# Patient Record
Sex: Female | Born: 1940 | Race: White | Hispanic: No | Marital: Married | Smoking: Never smoker
Health system: Southern US, Community
[De-identification: ages and names within clinical notes are randomized; demographics above are authoritative.]

## PROBLEM LIST (undated history)

## (undated) DIAGNOSIS — G43909 Migraine, unspecified, not intractable, without status migrainosus: Secondary | ICD-10-CM

## (undated) DIAGNOSIS — K219 Gastro-esophageal reflux disease without esophagitis: Secondary | ICD-10-CM

## (undated) DIAGNOSIS — I10 Essential (primary) hypertension: Secondary | ICD-10-CM

## (undated) DIAGNOSIS — M722 Plantar fascial fibromatosis: Secondary | ICD-10-CM

## (undated) DIAGNOSIS — K5730 Diverticulosis of large intestine without perforation or abscess without bleeding: Secondary | ICD-10-CM

## (undated) DIAGNOSIS — F329 Major depressive disorder, single episode, unspecified: Secondary | ICD-10-CM

## (undated) DIAGNOSIS — E785 Hyperlipidemia, unspecified: Secondary | ICD-10-CM

## (undated) DIAGNOSIS — F3289 Other specified depressive episodes: Secondary | ICD-10-CM

## (undated) DIAGNOSIS — M4800 Spinal stenosis, site unspecified: Secondary | ICD-10-CM

## (undated) DIAGNOSIS — E042 Nontoxic multinodular goiter: Secondary | ICD-10-CM

## (undated) DIAGNOSIS — R9431 Abnormal electrocardiogram [ECG] [EKG]: Secondary | ICD-10-CM

## (undated) HISTORY — DX: Hyperlipidemia, unspecified: E78.5

## (undated) HISTORY — DX: Plantar fascial fibromatosis: M72.2

## (undated) HISTORY — DX: Migraine, unspecified, not intractable, without status migrainosus: G43.909

## (undated) HISTORY — DX: Diverticulosis of large intestine without perforation or abscess without bleeding: K57.30

## (undated) HISTORY — DX: Gastro-esophageal reflux disease without esophagitis: K21.9

## (undated) HISTORY — DX: Major depressive disorder, single episode, unspecified: F32.9

## (undated) HISTORY — DX: Abnormal electrocardiogram (ECG) (EKG): R94.31

## (undated) HISTORY — PX: CATARACT EXTRACTION: SUR2

## (undated) HISTORY — DX: Nontoxic multinodular goiter: E04.2

## (undated) HISTORY — DX: Other specified depressive episodes: F32.89

## (undated) HISTORY — PX: OTHER SURGICAL HISTORY: SHX169

## (undated) HISTORY — DX: Essential (primary) hypertension: I10

## (undated) HISTORY — PX: TONSILLECTOMY: SHX5217

## (undated) HISTORY — DX: Spinal stenosis, site unspecified: M48.00

---

## 1999-10-29 ENCOUNTER — Encounter: Admission: RE | Admit: 1999-10-29 | Discharge: 1999-10-29 | Payer: Self-pay | Admitting: Internal Medicine

## 1999-10-29 ENCOUNTER — Encounter: Payer: Self-pay | Admitting: Internal Medicine

## 2000-09-22 ENCOUNTER — Other Ambulatory Visit: Admission: RE | Admit: 2000-09-22 | Discharge: 2000-09-22 | Payer: Self-pay | Admitting: Internal Medicine

## 2000-10-31 ENCOUNTER — Encounter: Admission: RE | Admit: 2000-10-31 | Discharge: 2000-10-31 | Payer: Self-pay | Admitting: Internal Medicine

## 2000-10-31 ENCOUNTER — Encounter: Payer: Self-pay | Admitting: Internal Medicine

## 2000-11-07 ENCOUNTER — Other Ambulatory Visit: Admission: RE | Admit: 2000-11-07 | Discharge: 2000-11-07 | Payer: Self-pay | Admitting: Obstetrics and Gynecology

## 2000-11-07 ENCOUNTER — Encounter (INDEPENDENT_AMBULATORY_CARE_PROVIDER_SITE_OTHER): Payer: Self-pay | Admitting: *Deleted

## 2001-08-25 ENCOUNTER — Encounter: Admission: RE | Admit: 2001-08-25 | Discharge: 2001-10-23 | Payer: Self-pay | Admitting: Internal Medicine

## 2001-11-01 ENCOUNTER — Encounter: Admission: RE | Admit: 2001-11-01 | Discharge: 2001-11-01 | Payer: Self-pay | Admitting: Internal Medicine

## 2001-11-01 ENCOUNTER — Encounter: Payer: Self-pay | Admitting: Internal Medicine

## 2002-11-06 ENCOUNTER — Encounter: Payer: Self-pay | Admitting: Internal Medicine

## 2002-11-06 ENCOUNTER — Encounter: Admission: RE | Admit: 2002-11-06 | Discharge: 2002-11-06 | Payer: Self-pay | Admitting: Internal Medicine

## 2002-11-08 ENCOUNTER — Encounter: Payer: Self-pay | Admitting: Internal Medicine

## 2002-11-08 ENCOUNTER — Encounter: Admission: RE | Admit: 2002-11-08 | Discharge: 2002-11-08 | Payer: Self-pay | Admitting: Internal Medicine

## 2003-11-11 ENCOUNTER — Encounter: Admission: RE | Admit: 2003-11-11 | Discharge: 2003-11-11 | Payer: Self-pay | Admitting: Internal Medicine

## 2004-01-15 ENCOUNTER — Other Ambulatory Visit: Admission: RE | Admit: 2004-01-15 | Discharge: 2004-01-15 | Payer: Self-pay | Admitting: Internal Medicine

## 2004-11-12 ENCOUNTER — Encounter: Admission: RE | Admit: 2004-11-12 | Discharge: 2004-11-12 | Payer: Self-pay | Admitting: Internal Medicine

## 2005-01-14 ENCOUNTER — Other Ambulatory Visit: Admission: RE | Admit: 2005-01-14 | Discharge: 2005-01-14 | Payer: Self-pay | Admitting: Internal Medicine

## 2005-11-15 ENCOUNTER — Encounter: Admission: RE | Admit: 2005-11-15 | Discharge: 2005-11-15 | Payer: Self-pay | Admitting: Internal Medicine

## 2006-01-17 ENCOUNTER — Other Ambulatory Visit: Admission: RE | Admit: 2006-01-17 | Discharge: 2006-01-17 | Payer: Self-pay | Admitting: Internal Medicine

## 2006-02-03 ENCOUNTER — Ambulatory Visit: Payer: Self-pay | Admitting: Gastroenterology

## 2006-03-15 ENCOUNTER — Ambulatory Visit: Payer: Self-pay | Admitting: Gastroenterology

## 2006-03-15 ENCOUNTER — Encounter (INDEPENDENT_AMBULATORY_CARE_PROVIDER_SITE_OTHER): Payer: Self-pay | Admitting: Specialist

## 2006-11-18 ENCOUNTER — Encounter: Admission: RE | Admit: 2006-11-18 | Discharge: 2006-11-18 | Payer: Self-pay | Admitting: *Deleted

## 2006-12-30 ENCOUNTER — Encounter: Admission: RE | Admit: 2006-12-30 | Discharge: 2006-12-30 | Payer: Self-pay | Admitting: *Deleted

## 2007-10-12 ENCOUNTER — Encounter: Payer: Self-pay | Admitting: Internal Medicine

## 2007-10-12 LAB — CONVERTED CEMR LAB
AST: 24 units/L
BUN: 15 mg/dL
Calcium: 9.7 mg/dL
Chloride: 101 meq/L
Cholesterol: 165 mg/dL
Glucose, Bld: 94 mg/dL
HDL: 58 mg/dL
LDL Cholesterol: 89 mg/dL
Potassium: 3.7 meq/L
Sodium: 140 meq/L
Total Protein: 6.7 g/dL

## 2007-11-24 ENCOUNTER — Encounter: Admission: RE | Admit: 2007-11-24 | Discharge: 2007-11-24 | Payer: Self-pay | Admitting: Internal Medicine

## 2008-04-29 ENCOUNTER — Encounter: Payer: Self-pay | Admitting: Internal Medicine

## 2008-04-29 LAB — CONVERTED CEMR LAB
AST: 26 units/L
Alkaline Phosphatase: 96 units/L
BUN: 18 mg/dL
Calcium: 10.4 mg/dL
Cholesterol: 192 mg/dL
Glucose, Bld: 93 mg/dL
LDL Cholesterol: 115 mg/dL
Potassium: 4.7 meq/L
Sodium: 143 meq/L
Total Protein: 7.4 g/dL

## 2008-05-08 ENCOUNTER — Encounter: Admission: RE | Admit: 2008-05-08 | Discharge: 2008-05-08 | Payer: Self-pay | Admitting: Family Medicine

## 2008-11-22 ENCOUNTER — Encounter: Payer: Self-pay | Admitting: Internal Medicine

## 2008-11-22 LAB — CONVERTED CEMR LAB
CO2: 30 meq/L
Chloride: 105 meq/L
Creatinine, Ser: 0.8 mg/dL
Potassium: 4.8 meq/L
Sodium: 142 meq/L
Triglyceride fasting, serum: 138 mg/dL

## 2008-11-25 ENCOUNTER — Encounter: Admission: RE | Admit: 2008-11-25 | Discharge: 2008-11-25 | Payer: Self-pay | Admitting: Family Medicine

## 2009-06-23 ENCOUNTER — Encounter: Payer: Self-pay | Admitting: Internal Medicine

## 2009-06-23 ENCOUNTER — Encounter: Admission: RE | Admit: 2009-06-23 | Discharge: 2009-06-23 | Payer: Self-pay | Admitting: Internal Medicine

## 2009-08-25 ENCOUNTER — Encounter: Payer: Self-pay | Admitting: Internal Medicine

## 2009-08-27 ENCOUNTER — Encounter: Payer: Self-pay | Admitting: Internal Medicine

## 2009-08-27 ENCOUNTER — Encounter: Admission: RE | Admit: 2009-08-27 | Discharge: 2009-08-27 | Payer: Self-pay | Admitting: *Deleted

## 2009-09-22 ENCOUNTER — Encounter: Payer: Self-pay | Admitting: Internal Medicine

## 2009-09-22 ENCOUNTER — Other Ambulatory Visit: Admission: RE | Admit: 2009-09-22 | Discharge: 2009-09-22 | Payer: Self-pay | Admitting: Obstetrics and Gynecology

## 2009-09-25 ENCOUNTER — Encounter: Payer: Self-pay | Admitting: Internal Medicine

## 2009-11-06 ENCOUNTER — Encounter: Payer: Self-pay | Admitting: Internal Medicine

## 2009-12-29 ENCOUNTER — Ambulatory Visit: Payer: Self-pay | Admitting: Internal Medicine

## 2009-12-29 DIAGNOSIS — F329 Major depressive disorder, single episode, unspecified: Secondary | ICD-10-CM

## 2009-12-29 DIAGNOSIS — F419 Anxiety disorder, unspecified: Secondary | ICD-10-CM

## 2009-12-29 DIAGNOSIS — I10 Essential (primary) hypertension: Secondary | ICD-10-CM | POA: Insufficient documentation

## 2009-12-29 DIAGNOSIS — E785 Hyperlipidemia, unspecified: Secondary | ICD-10-CM | POA: Insufficient documentation

## 2009-12-29 DIAGNOSIS — G43909 Migraine, unspecified, not intractable, without status migrainosus: Secondary | ICD-10-CM | POA: Insufficient documentation

## 2009-12-31 ENCOUNTER — Telehealth: Payer: Self-pay | Admitting: Internal Medicine

## 2010-01-09 ENCOUNTER — Encounter: Payer: Self-pay | Admitting: Internal Medicine

## 2010-01-09 DIAGNOSIS — M722 Plantar fascial fibromatosis: Secondary | ICD-10-CM

## 2010-01-09 DIAGNOSIS — M48061 Spinal stenosis, lumbar region without neurogenic claudication: Secondary | ICD-10-CM

## 2010-01-09 DIAGNOSIS — K219 Gastro-esophageal reflux disease without esophagitis: Secondary | ICD-10-CM

## 2010-01-09 DIAGNOSIS — K5730 Diverticulosis of large intestine without perforation or abscess without bleeding: Secondary | ICD-10-CM | POA: Insufficient documentation

## 2010-01-27 ENCOUNTER — Encounter: Payer: Self-pay | Admitting: Internal Medicine

## 2010-03-12 ENCOUNTER — Encounter: Admission: RE | Admit: 2010-03-12 | Discharge: 2010-03-12 | Payer: Self-pay | Admitting: Internal Medicine

## 2010-03-13 ENCOUNTER — Ambulatory Visit: Payer: Self-pay | Admitting: Internal Medicine

## 2010-03-15 DIAGNOSIS — R3989 Other symptoms and signs involving the genitourinary system: Secondary | ICD-10-CM

## 2010-03-16 LAB — CONVERTED CEMR LAB
HDL: 51 mg/dL (ref >39.00)
Total CHOL/HDL Ratio: 4
VLDL: 47.6 mg/dL — ABNORMAL HIGH (ref 0.0–40.0)

## 2010-04-06 ENCOUNTER — Encounter: Payer: Self-pay | Admitting: Internal Medicine

## 2010-04-15 ENCOUNTER — Telehealth: Payer: Self-pay | Admitting: Internal Medicine

## 2010-07-07 ENCOUNTER — Telehealth: Payer: Self-pay | Admitting: Internal Medicine

## 2010-07-07 DIAGNOSIS — N926 Irregular menstruation, unspecified: Secondary | ICD-10-CM

## 2010-07-07 DIAGNOSIS — N939 Abnormal uterine and vaginal bleeding, unspecified: Secondary | ICD-10-CM

## 2010-07-15 ENCOUNTER — Telehealth (INDEPENDENT_AMBULATORY_CARE_PROVIDER_SITE_OTHER): Payer: Self-pay | Admitting: *Deleted

## 2010-08-31 ENCOUNTER — Encounter
Admission: RE | Admit: 2010-08-31 | Discharge: 2010-08-31 | Payer: Self-pay | Source: Home / Self Care | Attending: Internal Medicine | Admitting: Internal Medicine

## 2010-09-01 ENCOUNTER — Encounter: Payer: Self-pay | Admitting: Internal Medicine

## 2010-09-13 LAB — CONVERTED CEMR LAB
ALT: 29 units/L
AST: 25 units/L
CO2: 24 meq/L
Chloride: 109 meq/L
Cholesterol: 229 mg/dL
HDL: 58 mg/dL
LDL Cholesterol: 131 mg/dL
Platelets: 297 10*3/uL
Potassium: 3.9 meq/L
Sodium: 143 meq/L
Triglyceride fasting, serum: 201 mg/dL
WBC: 7.4 10*3/uL

## 2010-09-15 ENCOUNTER — Ambulatory Visit
Admission: RE | Admit: 2010-09-15 | Discharge: 2010-09-15 | Payer: Self-pay | Source: Home / Self Care | Attending: Internal Medicine | Admitting: Internal Medicine

## 2010-09-15 ENCOUNTER — Encounter: Payer: Self-pay | Admitting: Internal Medicine

## 2010-09-15 DIAGNOSIS — R9431 Abnormal electrocardiogram [ECG] [EKG]: Secondary | ICD-10-CM | POA: Insufficient documentation

## 2010-09-15 HISTORY — DX: Abnormal electrocardiogram (ECG) (EKG): R94.31

## 2010-09-15 NOTE — Progress Notes (Signed)
Summary: referral/VAL pt  Phone Note Call from Patient Call back at Home Phone [phone]   Caller: Patient Summary of Call: Pt called requesting referral to female GYN. Pt states she cannot wait for VAL return because she is experiencing mild to moderate spotting Initial call taken by: Margaret Pyle, CMA,  July 07, 2010 11:20 AM  Follow-up for Phone Call        done Follow-up by: Minus Breeding MD,  July 07, 2010 12:47 PM  Additional Follow-up for Phone Call Additional follow up Details #1::        Pt informed and will expect call from [facility] with appt info Additional Follow-up by: Margaret Pyle, CMA,  July 07, 2010 2:54 PM  New Problems: ABNORMAL VAGINAL BLEEDING (ICD-626.9)   New Problems: ABNORMAL VAGINAL BLEEDING (ICD-626.9)

## 2010-09-15 NOTE — Progress Notes (Signed)
Summary: shingle injection  Phone Note Outgoing Call   Call placed by: Orlan Leavens RMA,  April 15, 2010 2:37 PM Call placed to: Patient Summary of Call: Tried to call pt with info concerning shingle injection. Was'nt at home. Left msg on vm for her to return my call. Since insurance Medicare does'nt have PART D it does not cover the shingle injection. she would have to pay up front. with her secondary [employer] she could give claim to them and they should be able to reimburse her back. Initial call taken by: Orlan Leavens RMA,  April 15, 2010 2:40 PM  Follow-up for Phone Call        Notified pt with info concerning injection. Pt states will think about getting at next office vist Follow-up by: Orlan Leavens RMA,  April 21, 2010 1:21 PM

## 2010-09-15 NOTE — Letter (Signed)
Summary: [facility]   Imported By: Sherian Rein 02/11/2010 09:29:15  _____________________________________________________________________  External Attachment:    Type:   Image     Comment:   External Document

## 2010-09-15 NOTE — Letter (Signed)
Summary: [facility]   Imported By: Sherian Rein 02/11/2010 09:26:02  _____________________________________________________________________  External Attachment:    Type:   Image     Comment:   External Document

## 2010-09-15 NOTE — Progress Notes (Signed)
Summary: Rx refill req  Phone Note Call from Patient Call back at Home Phone [phone]   Caller: Patient Summary of Call: pt called requesting [facility]ill of [facility] Initial call taken by: Margaret Pyle, CMA,  Dec 31, 2009 4:29 PM  Follow-up for Phone Call        ok to call in as listed - disp #30, 3 refills - thanks Follow-up by: Newt Lukes MD,  Dec 31, 2009 4:53 PM    Prescriptions: ZOLPIDEM TARTRATE 5 MG TABS (ZOLPIDEM TARTRATE) take 1-2 by mouth at bedtime prn  #30 x 3   Entered and Authorized by:   Margaret Pyle, CMA   Signed by:   Margaret Pyle, CMA on 12/31/2009   Method used:   [facility]phoned to ...       [facility]  [address] [id number]       [address]       [place], [place]  [address]       Ph: [phone] or [phone]       Fax: [phone]   RxID:   [id number]

## 2010-09-15 NOTE — Consult Note (Signed)
Summary: [facility]   Imported By: Lester Chesapeake 04/09/2010 10:38:58  _____________________________________________________________________  External Attachment:    Type:   Image     Comment:   External Document

## 2010-09-15 NOTE — Letter (Signed)
Summary: [facility]   Imported By: Sherian Rein 02/11/2010 09:28:11  _____________________________________________________________________  External Attachment:    Type:   Image     Comment:   External Document

## 2010-09-15 NOTE — Assessment & Plan Note (Signed)
Summary: 6-12 WK YEARLY---STC   Vital Signs:  Patient profile:   70 year old female Height:      64 inches (162.56 cm) Weight:      149.8 pounds (68.09 kg) O2 Sat:      96 % on Room air Temp:     97.9 degrees F (36.61 degrees C) oral Pulse rate:   83 / minute BP sitting:   132 / 72  (left arm) Cuff size:   regular  Vitals Entered By: Orlan Leavens RMA (March 13, 2010 9:33 AM)  O2 Flow:  Room air CC: CPX Is Patient Diabetic? No Pain Assessment Patient in pain? no      Comments Pt states she had EKG done @ [facility] results was normal  Vision Screening:Left eye with correction: 20 / 30 Right eye with correction: 20 / 25 Both eyes with correction: 20 / 25  Color vision testing: normal      Vision Entered By: Newt Lukes MD (March 13, 2010 10:22 AM)   Primary Care Provider:  Newt Lukes MD  CC:  CPX.  History of Present Illness: Here for wellness Diet: Heart Healthy or DM if diabetic Physical Activities: Sedentary Depression/mood screen: controlled symptoms - on med tx for same, see below Hearing: denies problem, no formal screening ever done Visual Acuity: Grossly normal, wears reading glasses, sees optho yearly  ADL's: Capable - timed get-up and go approp Fall Risk: None Home Safety: Good End-of-Life Planning: Advance directive - Full code/I agree   alos review other med issues: 1) chronic low back pain - sees ortho for pain mgmt of same - occ tramadol use if severe pain - no trauma or change in nature of pain, no radiation  2) dyslipidemia - reports compliance with ongoing medical treatment and no changes in medication dose or frequency. denies adverse side effects related to current therapy. no GI or new MSkel c/o  3) migraines - reports compliance with ongoing medical treatment and no changes in medication dose or frequency. denies adverse side effects related to current therapy but would like to wean off topamax if possible -has  restablished with neuro for review of symptoms and trigger point shots [facility]  4) depression - begun on lexapro 09/2009 for same - initially much improved on tx - less anxious, less sad, less tearful - changed to generic citalopram 11/2009 due to high cost of lexapro - - not yet ready to stop taking med and ?if increase dose ok to improve "nervousness" spells  Preventive Screening-Counseling & Management  Alcohol-Tobacco     Alcohol drinks/day: 0     Alcohol Counseling: not indicated; patient does not drink     Smoking Status: never     Tobacco Counseling: not indicated; no tobacco use  Caffeine-Diet-Exercise     Caffeine Counseling: not indicated; caffeine use is not excessive or problematic     Nutrition Referrals: no     Does Patient Exercise: no     Exercise Counseling: to improve exercise regimen     Depression Counseling: not indicated; screening negative for depression  Safety-Violence-Falls     Seat Belt Use: yes     Seat Belt Counseling: not applicable     Helmet Use: yes     Helmet Counseling: not applicable     Firearms in the Home: firearms in the home     Firearm Counseling: not indicated; uses recommended firearm safety measures     Smoke Detectors:  yes     Smoke Detector Counseling: n/a     Violence in the Home: no risk noted     Fall Risk Counseling: not indicated; no significant falls noted  Clinical Review Panels:  Prevention   Last Mammogram:  No specific mammographic evidence of malignancy.   (08/27/2009)   Last Colonoscopy:  Location:  [facility].  Results: Normal. (01/14/2006)  Immunizations   Last Tetanus Booster:  Td (03/13/2010)   Last Pneumovax:  Pneumovax (Medicare) (03/13/2010)  Lipid Management   Cholesterol:  229 (06/23/2009)   LDL (bad choesterol):  131 (06/23/2009)   HDL (good cholesterol):  58 (06/23/2009)   Triglycerides:  201 (06/23/2009)  CBC   WBC:  7.4 (06/23/2009)   RBC:  4.61 (06/23/2009)   Hgb:  15.0  (06/23/2009)   Hct:  42.7 (06/23/2009)   Platelets:  297 (06/23/2009)  Complete Metabolic Panel   Glucose:  103 (06/23/2009)   Sodium:  143 (06/23/2009)   Potassium:  3.9 (06/23/2009)   Chloride:  109 (06/23/2009)   CO2:  24 (06/23/2009)   BUN:  22 (06/23/2009)   Creatinine:  0.80 (06/23/2009)   Albumin:  4.4 (06/23/2009)   Total Protein:  7.1 (06/23/2009)   Calcium:  10.0 (06/23/2009)   Total Bili:  0.6 (06/23/2009)   Alk Phos:  87 (06/23/2009)   SGPT (ALT):  29 (06/23/2009)   SGOT (AST):  25 (06/23/2009)   Current Medications (verified): 1)  Citracal Petites/vitamin D 200-250 Mg-Unit Tabs (Calcium Citrate-Vitamin D) .... Take 2 Two Times A Day 2)  Zolpidem Tartrate 5 Mg Tabs (Zolpidem Tartrate) .... Take 1-2 By Mouth At Bedtime Prn 3)  Multivitamins  Tabs (Multiple Vitamin) .... Take 1 By Mouth Once Daily 4)  Topamax 50 Mg Tabs (Topiramate) .Marland Kitchen.. 1 By Mouth Once Daily 5)  Imitrex 100 Mg Tabs (Sumatriptan Succinate) .... Take 1 By Mouth Once Daily As Needed 6)  Advicor 1000-20 Mg Xr24h-Tab (Niacin-Lovastatin) .... Take 2 At Bedtime 7)  Omeprazole 20 Mg Cpdr (Omeprazole) .... Take 1 By Mouth Once Daily 8)  Tramadol Hcl 50 Mg Tabs (Tramadol Hcl) .... Take 1 Q 4-6 Hours As Needed 9)  Citalopram Hydrobromide 20 Mg Tabs (Citalopram Hydrobromide) .Marland Kitchen.. 1 By Mouth Once Daily  Allergies (verified): No Known Drug Allergies  Past History:  Past medical, surgical, family and social histories (including risk factors) reviewed, and no changes noted (except as noted below).  Past Medical History: Depression Hypertension dyslipidemia multinodular goiter GERD  MD roster: pain - wang ([facility]) endo - [facility] - [facility] - [facility]  Past Surgical History: Reviewed history from 12/29/2009 and no changes required. Tonsillectomy (1950) Child birth x's 2 (75 & 25)  Family History: Reviewed history from 12/29/2009 and no changes required. Family History of Arthritis  (parent) Family History High cholesterol (parent) Heart disease (parent, grandparent)  Social History: Reviewed history from 12/29/2009 and no changes required. Never Smoked, no alcohol married, lives with spouse and 2 sons - invloved with social activites with spouse at senior center and church  Review of Systems       occ bladder pressure, no dysuria - no prolapse - otherwise, see HPI above. I have reviewed all other systems and they were negative.   Physical Exam  General:  alert, well-developed, well-nourished, and cooperative to examination.    Eyes:  vision grossly intact; pupils equal, round and reactive to light.  conjunctiva and lids normal.    Ears:  normal pinnae bilaterally, without erythema, swelling,  or tenderness to palpation. TMs clear, without effusion, or cerumen impaction. Hearing grossly normal bilaterally  Mouth:  teeth and gums in good repair; mucous membranes moist, without lesions or ulcers. oropharynx clear without exudate, no erythema.  Neck:  supple, full ROM, no masses, no thyromegaly; no thyroid nodules or tenderness. no JVD or carotid bruits.   Lungs:  normal respiratory effort, no intercostal retractions or use of accessory muscles; normal breath sounds bilaterally - no crackles and no wheezes.    Heart:  normal rate, regular rhythm, no murmur, and no rub. BLE without edema. normal DP pulses and normal cap refill in all 4 extremities    Abdomen:  soft, non-tender, normal bowel sounds, no distention; no masses and no appreciable hepatomegaly or splenomegaly.   Genitalia:  defer Msk:  No deformity or scoliosis noted of thoracic or lumbar spine.   Neurologic:  alert & oriented X3 and cranial nerves II-XII symetrically intact.  strength normal in all extremities, sensation intact to light touch, and gait normal. speech fluent without dysarthria or aphasia; follows commands with good comprehension.  Skin:  no rashes, vesicles, ulcers, or erythema. No nodules or  irregularity to palpation.  Psych:  Oriented X3, memory intact for recent and remote, normally interactive, good eye contact, not anxious appearing, not depressed appearing, and not agitated.      Impression & Recommendations:  Problem # 1:  PREVENTIVE HEALTH CARE (ICD-V70.0) Patient has been counseled on age-appropriate routine health concerns for screening and prevention. These are reviewed and up-to-date. Immunizations are up-to-date or declined.  Risk factors for depression per USPSTF are reviewed and negative;Patient hearing function and visual acuity is screened today; ADLs are reviewed and addressed as needed; functional ability and level of safety have been reviewed and are appropriate. Education, counseling, and referrals are performed based on  urologic compliants - see below Orders: First annual wellness visit with prevention plan  (Z6109)  Problem # 2:  DYSLIPIDEMIA (ICD-272.4)  Her updated medication list for this problem includes:    Advicor 1000-20 Mg Xr24h-tab (Niacin-lovastatin) .Marland Kitchen... Take 2 at bedtime  Orders: TLB-Lipid Panel (80061-LIPID)  Labs Reviewed: SGOT: 25 (06/23/2009)   SGPT: 29 (06/23/2009)   HDL:58 (06/23/2009), 64 (11/22/2008)  LDL:131 (06/23/2009), 134 (11/22/2008)  Chol:229 (06/23/2009), 226 (11/22/2008)  Trig:201 (06/23/2009), 138 (11/22/2008)  Problem # 3:  DEPRESSION (ICD-311)  Her updated medication list for this problem includes:    Citalopram Hydrobromide 40 Mg Tabs (Citalopram hydrobromide) .Marland Kitchen... 1 by mouth once daily    Trazodone Hcl 50 Mg Tabs (Trazodone hcl) .Marland Kitchen... 1 by mouth at bedtime  Orders: Prescription Created Electronically [phone]) continue generic celexa, but inc dose -new erx done cont tx as symptoms well controlled but pt to contact us if change related to med change - willuse trazodone in place of BZ for sleep aide - pt agrees - new erx done  Problem # 4:  HYPERTENSION (ICD-401.9)  The following medications were removed from  the medication list:    Hydrochlorothiazide 25 Mg Tabs (Hydrochlorothiazide) .Marland Kitchen... Take 1 by mouth once daily as needed  BP today: 132/72 Prior BP: 130/72 (12/29/2009)  Labs Reviewed: K+: 3.9 (06/23/2009) Creat: : 0.80 (06/23/2009)   Chol: 229 (06/23/2009)   HDL: 58 (06/23/2009)   LDL: 131 (06/23/2009)   TG: 201 (06/23/2009)  Problem # 5:  OTHER SYMPTOMS INVOLVING URINARY SYSTEM (ICD-788.99)  Orders: Urology Referral (Urology)  Complete Medication List: 1)  Citracal Petites/vitamin D 200-250 Mg-unit Tabs (Calcium citrate-vitamin d) .... Take 2 two  times a day 2)  Zolpidem Tartrate 5 Mg Tabs (Zolpidem tartrate) .... Take 1-2 by mouth at bedtime prn 3)  Multivitamins Tabs (Multiple vitamin) .... Take 1 by mouth once daily 4)  Topamax 50 Mg Tabs (Topiramate) .Marland Kitchen.. 1 by mouth once daily 5)  Imitrex 100 Mg Tabs (Sumatriptan succinate) .... Take 1 by mouth once daily as needed 6)  Advicor 1000-20 Mg Xr24h-tab (Niacin-lovastatin) .... Take 2 at bedtime 7)  Omeprazole 20 Mg Cpdr (Omeprazole) .... Take 1 by mouth once daily 8)  Tramadol Hcl 50 Mg Tabs (Tramadol hcl) .... Take 1 q 4-6 hours as needed 9)  Citalopram Hydrobromide 40 Mg Tabs (Citalopram hydrobromide) .Marland Kitchen.. 1 by mouth once daily 10)  Trazodone Hcl 50 Mg Tabs (Trazodone hcl) .Marland Kitchen.. 1 by mouth at bedtime 11)  Zovirax 5 % Oint (Acyclovir) .... Use as needed  Other Orders: TD Toxoids IM 7 YR + (81191) Admin 1st Vaccine (47829) Pneumococcal Vaccine (56213) Admin of Any Addtl Vaccine (08657)  Patient Instructions: 1)  it was good to see you today. 2)  test(s) ordered today - your results will be posted on the phone tree for review in 48-72 hours from the time of test completion; call [phone] and enter your 9 digit MRN (listed above on this page, just below your name); if any changes need to be made or there are abnormal results, you will be contacted directly. 3)  we'll make referral to urology. Our office will contact you  regarding this appointment once made.  4)  increase citalopram and start trazodone for sleep and nervousness - 5)  these new medications and refills have been electronically submitted to your [facility] pharmacy. Please take as directed. Contact our office if you believe you're having problems with the medication(s).  6)  Tdap and pneumoavax shots today 7)  Please schedule a follow-up appointment in 6 months to review cholesterol and medications, call sooner if problems.  Prescriptions: ZOVIRAX 5 % OINT (ACYCLOVIR) use as needed  #3g x 1   Entered and Authorized by:   Newt Lukes MD   Signed by:   Newt Lukes MD on 03/13/2010   Method used:   Print then Give to Patient   RxID:   [id number] TRAZODONE HCL 50 MG TABS (TRAZODONE HCL) 1 by mouth at bedtime  #30 x 6   Entered and Authorized by:   Newt Lukes MD   Signed by:   Newt Lukes MD on 03/13/2010   Method used:   Electronically to        [facility] (retail)       [address]       [place], [place]  [address]       Ph: [phone]       Fax: [phone]   RxID:   [id number] TRAMADOL HCL 50 MG TABS (TRAMADOL HCL) take 1 q 4-6 hours as needed  #180 x 6   Entered and Authorized by:   Newt Lukes MD   Signed by:   Newt Lukes MD on 03/13/2010   Method used:   Electronically to        [facility] (retail)       [address]       [place], [place]  [address]       Ph: [phone]  Fax: [phone]   RxID:   [id number] OMEPRAZOLE 20 MG CPDR (OMEPRAZOLE) take 1 by mouth once daily  #30 x 6   Entered and Authorized by:   Newt Lukes MD   Signed by:   Newt Lukes MD on 03/13/2010   Method used:   Electronically to        [facility] (retail)       [address]       [place], [place]  [address]       Ph: [phone]       Fax: [phone]   RxID:    [id number] CITALOPRAM HYDROBROMIDE 40 MG TABS (CITALOPRAM HYDROBROMIDE) 1 by mouth once daily  #30 x 6   Entered and Authorized by:   Newt Lukes MD   Signed by:   Newt Lukes MD on 03/13/2010   Method used:   Electronically to        [facility] (retail)       [address]       [place], [place]  [address]       Ph: [phone]       Fax: [phone]   RxID:   [id number]    Immunizations Administered:  Tetanus Vaccine:    Vaccine Type: Td    Site: right deltoid    Mfr: Sanofi Pasteur    Dose: 0.5 ml    Route: IM    Given by: Orlan Leavens RMA    Exp. Date: 09/17/2011    Lot #: [id number]    VIS given: 03/13/10  Pneumonia Vaccine:    Vaccine Type: Pneumovax (Medicare)    Site: left deltoid    Mfr: Merck    Dose: 0.5 ml    Route: IM    Given by: Orlan Leavens RMA    Exp. Date: 08/15/2011    Lot #: [id number]    VIS given: 03/13/10

## 2010-09-15 NOTE — Letter (Signed)
Summary: [facility]   Imported By: Sherian Rein 02/11/2010 09:27:07  _____________________________________________________________________  External Attachment:    Type:   Image     Comment:   External Document

## 2010-09-15 NOTE — Assessment & Plan Note (Signed)
Summary: NEW / MEDICARE / #/ CD   Vital Signs:  Patient profile:   70 year old female Height:      64 inches (162.56 cm) Weight:      149.0 pounds (67.73 kg) BMI:     25.67 O2 Sat:      98 % on Room air Temp:     97.5 degrees F (36.39 degrees C) oral Pulse rate:   72 / minute BP sitting:   130 / 72  (left arm) Cuff size:   regular  Vitals Entered By: Orlan Leavens (Dec 29, 2009 2:25 PM)  O2 Flow:  Room air CC: New patient Is Patient Diabetic? No Pain Assessment Patient in pain? no        Primary Care Provider:  Newt Lukes MD  CC:  New patient.  History of Present Illness: new pt to me and our practice, here to est care prev followed at [facility]  1) chronic low back pain - sees ortho for pain mgmt of same - occ tramadol use if severe pain - no trauma or change in nature of pain, no radiation  2) dyslipidemia - reports compliance with ongoing medical treatment and no changes in medication dose or frequency. denies adverse side effects related to current therapy. no GI or new MSkel c/o  3) migraines - reports compliance with ongoing medical treatment and no changes in medication dose or frequency. denies adverse side effects related to current therapy but would like to wean off topamaz if possible - previously saw neuro (adelman) for HA and would like to restablish with neuro for review of symptoms   4) depression - begun on lexapro 09/2009 for same - feels much improved on tx - less anxious, less sad, less tearful - only concern is re: high cost of lexapro - ?generic available - not yet ready to stop taking med  Preventive Screening-Counseling & Management  Alcohol-Tobacco     Alcohol drinks/day: 0     Alcohol Counseling: not indicated; patient does not drink     Smoking Status: never     Tobacco Counseling: not indicated; no tobacco use  Caffeine-Diet-Exercise     Caffeine Counseling: not indicated; caffeine use is not excessive or problematic  Nutrition Referrals: no     Does Patient Exercise: no     Exercise Counseling: to improve exercise regimen     Depression Counseling: not indicated; screening negative for depression  Safety-Violence-Falls     Seat Belt Use: yes     Seat Belt Counseling: not applicable     Helmet Use: yes     Helmet Counseling: not applicable     Firearms in the Home: firearms in the home     Firearm Counseling: not indicated; uses recommended firearm safety measures     Smoke Detectors: yes     Smoke Detector Counseling: n/a     Violence in the Home: no risk noted     Fall Risk Counseling: not indicated; no significant falls noted  Clinical Review Panels:  Prevention   Last Mammogram:  [facility].   No specific mammographic evidence of malignancy.  Assessment: BIRADS 1. (08/27/2009)   Last Colonoscopy:  Location:  [facility].  Results: Normal. (01/14/2006)   Current Medications (verified): 1)  Citracal Petites/vitamin D 200-250 Mg-Unit Tabs (Calcium Citrate-Vitamin D) .... Take 2 Two Times A Day 2)  Zolpidem Tartrate 5 Mg Tabs (Zolpidem Tartrate) .... Take 1-2 By Mouth At Bedtime  Prn 3)  Multivitamins  Tabs (Multiple Vitamin) .... Take 1 By Mouth Once Daily 4)  Topamax 100 Mg Tabs (Topiramate) .... Take 1 By Mouth Once Daily 5)  Imitrex 100 Mg Tabs (Sumatriptan Succinate) .... Take 1 By Mouth Once Daily As Needed 6)  Hydrochlorothiazide 25 Mg Tabs (Hydrochlorothiazide) .... Take 1 By Mouth Once Daily 7)  Advicor 1000-20 Mg Xr24h-Tab (Niacin-Lovastatin) .... Take 2 At Bedtime 8)  Omeprazole 20 Mg Cpdr (Omeprazole) .... Take 1 By Mouth Once Daily 9)  Tramadol Hcl 50 Mg Tabs (Tramadol Hcl) .... Take 1 Q 4-6 Hours As Needed 10)  Lexapro 20 Mg Tabs (Escitalopram Oxalate) .... Take 1 By Mouth Qd  Allergies (verified): No Known Drug Allergies  Past History:  Past Medical History: Depression Hypertension dyslipidemia multinodular goiter  MD  rooster: pain - wang ([facility]) endo - [facility] - [facility] - [facility]  Past Surgical History: Tonsillectomy (1951) Child birth x's 2 (53 & 18)  Family History: Family History of Arthritis (parent) Family History High cholesterol (parent) Heart disease (parent, grandparent)  Social History: Never Smoked, no alcohol married, lives with spouse and 2 sons - invloved with social activites with spouse at senior center and church Smoking Status:  never Does Patient Exercise:  no Risk analyst Use:  yes  Review of Systems       see HPI above. I have reviewed all other systems and they were negative.   Physical Exam  General:  alert, well-developed, well-nourished, and cooperative to examination.    Head:  Normocephalic and atraumatic without obvious abnormalities. No apparent alopecia or balding. Eyes:  vision grossly intact; pupils equal, round and reactive to light.  conjunctiva and lids normal.    Ears:  normal pinnae bilaterally, without erythema, swelling, or tenderness to palpation. TMs clear, without effusion, or cerumen impaction. Hearing grossly normal bilaterally  Mouth:  teeth and gums in good repair; mucous membranes moist, without lesions or ulcers. oropharynx clear without exudate, no erythema.  Neck:  supple, full ROM, no masses, no thyromegaly; no thyroid nodules or tenderness. no JVD or carotid bruits.   Lungs:  normal respiratory effort, no intercostal retractions or use of accessory muscles; normal breath sounds bilaterally - no crackles and no wheezes.    Heart:  normal rate, regular rhythm, no murmur, and no rub. BLE without edema. normal DP pulses and normal cap refill in all 4 extremities    Abdomen:  soft, non-tender, normal bowel sounds, no distention; no masses and no appreciable hepatomegaly or splenomegaly.   Genitalia:  defer to gyn Msk:  No deformity or scoliosis noted of thoracic or lumbar spine.   Neurologic:  alert & oriented X3 and cranial  nerves II-XII symetrically intact.  strength normal in all extremities, sensation intact to light touch, and gait normal. speech fluent without dysarthria or aphasia; follows commands with good comprehension.  Skin:  no rashes, vesicles, ulcers, or erythema. No nodules or irregularity to palpation.  Psych:  Oriented X3, memory intact for recent and remote, normally interactive, good eye contact, not anxious appearing, not depressed appearing, and not agitated.      Impression & Recommendations:  Problem # 1:  MIGRAINE HEADACHE (ICD-346.90) start wean of topamax as per pt desire to be off this med tx - she will contact us if change precipitates exceleration in symptoms - also refer to neuro for review of migraine med mgmt, esp with med changes ongoing -  ?acupunture or massage interests per pt- Her  updated medication list for this problem includes:    Imitrex 100 Mg Tabs (Sumatriptan succinate) .Marland Kitchen... Take 1 by mouth once daily as needed    Tramadol Hcl 50 Mg Tabs (Tramadol hcl) .Marland Kitchen... Take 1 q 4-6 hours as needed  Orders: Headache Clinic Referral (Headache) Prescription Created Electronically [id number])  Problem # 2:  DEPRESSION (ICD-311)  change lexapro to generic celexa -new erx done cont tx as symptoms well controlled but pt to contact us if change related to med change Her updated medication list for this problem includes:    Citalopram Hydrobromide 20 Mg Tabs (Citalopram hydrobromide) .Marland Kitchen... 1 by mouth once daily  Orders: Prescription Created Electronically [id number])  Problem # 3:  HYPERTENSION (ICD-401.9)  Her updated medication list for this problem includes:    Hydrochlorothiazide 25 Mg Tabs (Hydrochlorothiazide) .Marland Kitchen... Take 1 by mouth once daily as needed  BP today: 130/72  Problem # 4:  DYSLIPIDEMIA (ICD-272.4) send for records -  cont same until next lab check Her updated medication list for this problem includes:    Advicor 1000-20 Mg Xr24h-tab (Niacin-lovastatin) .Marland Kitchen... Take  2 at bedtime  Complete Medication List: 1)  Citracal Petites/vitamin D 200-250 Mg-unit Tabs (Calcium citrate-vitamin d) .... Take 2 two times a day 2)  Zolpidem Tartrate 5 Mg Tabs (Zolpidem tartrate) .... Take 1-2 by mouth at bedtime prn 3)  Multivitamins Tabs (Multiple vitamin) .... Take 1 by mouth once daily 4)  Topamax 50 Mg Tabs (Topiramate) .Marland Kitchen.. 1 by mouth once daily 5)  Imitrex 100 Mg Tabs (Sumatriptan succinate) .... Take 1 by mouth once daily as needed 6)  Hydrochlorothiazide 25 Mg Tabs (Hydrochlorothiazide) .... Take 1 by mouth once daily as needed 7)  Advicor 1000-20 Mg Xr24h-tab (Niacin-lovastatin) .... Take 2 at bedtime 8)  Omeprazole 20 Mg Cpdr (Omeprazole) .... Take 1 by mouth once daily 9)  Tramadol Hcl 50 Mg Tabs (Tramadol hcl) .... Take 1 q 4-6 hours as needed 10)  Citalopram Hydrobromide 20 Mg Tabs (Citalopram hydrobromide) .Marland Kitchen.. 1 by mouth once daily  Patient Instructions: 1)  it was good to meet you today.  2)  change Lexapro to generic citalopram  -  3)  will reduce dose of topamax to start weaning off this medications - 4)  your prescriptions have been electronically submitted to your pharmacy. Please take as directed. Contact our office if you believe you're having problems with the medication(s).  5)  we'll make referral to [facility] for your migraines. Our office will contact you regarding this appointment once made.  6)  will send for records from [place] at [facility] 7)  Please schedule a follow-up appointment in 6-12 weeks for physical, call sooner if problems. come in fasting for labwork that day. Prescriptions: TRAMADOL HCL 50 MG TABS (TRAMADOL HCL) take 1 q 4-6 hours as needed  #40 x 5   Entered and Authorized by:   Newt Lukes MD   Signed by:   Newt Lukes MD on 12/29/2009   Method used:   Electronically to        [facility]  [address] [id number] (retail)       [address]       [place], [place]   [address]       Ph: [phone] or [phone]       Fax: [phone]   RxID:   [id number] OMEPRAZOLE 20 MG CPDR (OMEPRAZOLE) take 1 by mouth once daily  #  30 x 5   Entered and Authorized by:   Newt Lukes MD   Signed by:   Newt Lukes MD on 12/29/2009   Method used:   Electronically to        [facility]  [address] [id number] (retail)       [address]       [place], [place]  [address]       Ph: [phone] or [phone]       Fax: [phone]   RxID:   [id number] ADVICOR 1000-20 MG XR24H-TAB (NIACIN-LOVASTATIN) take 2 at bedtime  #60 x 5   Entered and Authorized by:   Newt Lukes MD   Signed by:   Newt Lukes MD on 12/29/2009   Method used:   [facility] to        [facility]  [address] [id number]       [address]       [place], [place]  [address]       Ph: [phone] or [phone]       Fax: [phone]   RxID:   [id number] IMITREX 100 MG TABS (SUMATRIPTAN SUCCINATE) take 1 by mouth once daily as needed  #27 x 3   Entered and Authorized by:   Newt Lukes MD   Signed by:   Newt Lukes MD on 12/29/2009   Method used:   [facility] to        [facility]  [address] [id number]       [address]       [place], [place]  [address]       Ph: [phone] or [phone]       Fax: [phone]   RxID:   [id number] TOPAMAX 50 MG TABS (TOPIRAMATE) 1 by mouth once daily  #30 x 5   Entered and Authorized by:   Newt Lukes MD   Signed by:   Newt Lukes MD on 12/29/2009   Method used:   [facility] to        [facility]  [address] [id number]       [address]       [place], [place]  [address]       Ph: [phone] or [phone]       Fax: [phone]   RxID:   [id number] CITALOPRAM HYDROBROMIDE 20 MG TABS (CITALOPRAM HYDROBROMIDE) 1 by mouth once daily  #30 x 5   Entered and Authorized by:   Newt Lukes MD    Signed by:   Newt Lukes MD on 12/29/2009   Method used:   [facility] to        [facility]  [address] [id number]       [address]       [place], [place]  [address]       Ph: [phone] or [phone]       Fax: [phone]   RxID:   [id number]    Colonoscopy  Procedure date:  01/14/2006  Findings:      Location:  [facility].  Results: Normal.

## 2010-09-15 NOTE — Consult Note (Signed)
[facility]   Imported By: Sherian Rein 02/02/2010 09:08:01  _____________________________________________________________________  External Attachment:    Type:   Image     Comment:   External Document

## 2010-09-17 NOTE — Progress Notes (Signed)
Summary: Referral  Phone Note Call from Patient Call back at Home Phone [phone]   Caller: Patient Summary of Call: Pt called to check status of referral to GYN, please call pt and advise. Initial call taken by: Margaret Pyle, CMA,  July 15, 2010 11:10 AM  Follow-up for Phone Call        pt appt was  scheduled Tue jan 3,2011@10 :15 Dr Arlyce Dice pt states she already made her own appt  Follow-up by: Shelbie Proctor,  July 31, 2010 9:53 AM

## 2010-09-17 NOTE — Letter (Signed)
Summary: [facility]  [facility]   Imported By: Sherian Rein 09/08/2010 12:00:32  _____________________________________________________________________  External Attachment:    Type:   Image     Comment:   External Document

## 2010-09-21 ENCOUNTER — Ambulatory Visit: Payer: Self-pay | Admitting: Internal Medicine

## 2010-09-23 ENCOUNTER — Telehealth (INDEPENDENT_AMBULATORY_CARE_PROVIDER_SITE_OTHER): Payer: Self-pay | Admitting: *Deleted

## 2010-09-23 NOTE — Assessment & Plan Note (Signed)
Summary: SURGERY CLEARANCE---[facility]   Vital Signs:  Patient profile:   70 year old female Height:      64 inches (162.56 cm) Weight:      159.4 pounds (72.45 kg) O2 Sat:      95 % on Room air Temp:     98.5 degrees F (36.94 degrees C) oral Pulse rate:   78 / minute BP sitting:   120 / 62  (left arm) Cuff size:   regular  Vitals Entered By: Orlan Leavens RMA (September 15, 2010 3:56 PM)  O2 Flow:  Room air CC: Medical clearance Is Patient Diabetic? No Pain Assessment Patient in pain? no        Primary Care Provider:  Newt Lukes MD  CC:  Medical clearance.  History of Present Illness: here for med clearance - requested by dr. Kirtland Bouchard foglelan gyn planning d&c, hysteroscopy and polypectomy 10/02/10 - denies CP, DOE or syncopre -  no hx CAD or DM2 or renal dz - no SOB, cough, fever or bowel changes no known asthma, copd or pulm dz no hx probs with prior anesthesia  also review other med issues: 1) chronic low back pain - sees ortho for pain mgmt of same - occ tramadol use if severe pain - no trauma or change in nature of pain, no radiation  2) dyslipidemia - reports compliance with ongoing medical treatment and no changes in medication dose or frequency. denies adverse side effects related to current therapy. no GI or new MSkel c/o  3) migraines - reports compliance with ongoing medical treatment and no changes in medication dose or frequency. denies adverse side effects related to current therapy but would like to wean off topamax if possible -has restablished with neuro for review of symptoms and trigger point shots at [facility]  4) depression - begun on lexapro 09/2009 for same - initially much improved on tx - less anxious, less sad, less tearful - changed to generic citalopram 11/2009 due to high cost of lexapro -then increase dose 02/2010 to improve "nervous" spells - doing well  Preventive Screening-Counseling & Management  Alcohol-Tobacco     Alcohol drinks/day:  0     Alcohol Counseling: not indicated; patient does not drink     Smoking Status: never     Tobacco Counseling: not indicated; no tobacco use  Caffeine-Diet-Exercise     Caffeine Counseling: not indicated; caffeine use is not excessive or problematic     Nutrition Referrals: no     Does Patient Exercise: no     Exercise Counseling: to improve exercise regimen     Depression Counseling: not indicated; screening negative for depression  Clinical Review Panels:  Prevention   Last Mammogram:  ASSESSMENT: Negative - BI-RADS 1^MM DIGITAL SCREENING (08/31/2010)   Last Colonoscopy:  Location:  [facility].  Results: Normal. (01/14/2006)  Immunizations   Last Tetanus Booster:  Td (03/13/2010)   Last Pneumovax:  Pneumovax (Medicare) (03/13/2010)  Lipid Management   Cholesterol:  218 (03/13/2010)   LDL (bad choesterol):  131 (06/23/2009)   HDL (good cholesterol):  51.00 (03/13/2010)   Triglycerides:  201 (06/23/2009)  CBC   WBC:  7.4 (06/23/2009)   RBC:  4.61 (06/23/2009)   Hgb:  15.0 (06/23/2009)   Hct:  42.7 (06/23/2009)   Platelets:  297 (06/23/2009)  Complete Metabolic Panel   Glucose:  103 (06/23/2009)   Sodium:  143 (06/23/2009)   Potassium:  3.9 (06/23/2009)   Chloride:  109 (06/23/2009)  CO2:  24 (06/23/2009)   BUN:  22 (06/23/2009)   Creatinine:  0.80 (06/23/2009)   Albumin:  4.4 (06/23/2009)   Total Protein:  7.1 (06/23/2009)   Calcium:  10.0 (06/23/2009)   Total Bili:  0.6 (06/23/2009)   Alk Phos:  87 (06/23/2009)   SGPT (ALT):  29 (06/23/2009)   SGOT (AST):  25 (06/23/2009)   Current Medications (verified): 1)  Citracal Petites/vitamin D 200-250 Mg-Unit Tabs (Calcium Citrate-Vitamin D) .... Take 2 Two Times A Day 2)  Multivitamins  Tabs (Multiple Vitamin) .... Take 1 By Mouth Once Daily 3)  Imitrex 100 Mg Tabs (Sumatriptan Succinate) .... Take 1 By Mouth Once Daily As Needed 4)  Advicor 1000-20 Mg Xr24h-Tab (Niacin-Lovastatin) .... Take 2 At  Bedtime 5)  Omeprazole 20 Mg Cpdr (Omeprazole) .... Take 1 By Mouth Once Daily 6)  Tramadol Hcl 50 Mg Tabs (Tramadol Hcl) .... Take 1 Q 4-6 Hours As Needed 7)  Citalopram Hydrobromide 40 Mg Tabs (Citalopram Hydrobromide) .Marland Kitchen.. 1 By Mouth Once Daily 8)  Trazodone Hcl 50 Mg Tabs (Trazodone Hcl) .Marland Kitchen.. 1 By Mouth At Bedtime 9)  Zovirax 5 % Oint (Acyclovir) .... Use As Needed 10)  Topamax 100 Mg Tabs (Topiramate) .... Take 1 By Mouth Once Daily 11)  Vitamin D3 1000 Unit Tabs (Cholecalciferol) .... Take 1 By Mouth Once Daily  Allergies (verified): No Known Drug Allergies  Past History:  Past Medical History: Depression Hypertension dyslipidemia multinodular goiter GERD  MD roster: pain - wang ([facility]) endo - [facility] - [facility] gyn - fogleman  Past Surgical History: Tonsillectomy (1951) Child birth x's 2 (76 & 60)   Family History: Family History of Arthritis (parent) Family History High cholesterol (parent) Heart disease (parent, grandparent)   Social History: Never Smoked, no alcohol  married, lives with spouse and 2 sons - invloved with social activites with spouse at senior center and church  Review of Systems       see HPI above. I have reviewed all other systems and they were negative.   Physical Exam  General:  alert, well-developed, well-nourished, and cooperative to examination.    Eyes:  vision grossly intact; pupils equal, round and reactive to light.  conjunctiva and lids normal.    Ears:  R ear normal and L ear normal.   Mouth:  teeth and gums in good repair; mucous membranes moist, without lesions or ulcers. oropharynx clear without exudate, no erythema.  Lungs:  normal respiratory effort, no intercostal retractions or use of accessory muscles; normal breath sounds bilaterally - no crackles and no wheezes.    Heart:  normal rate, regular rhythm, no murmur, and no rub. BLE without edema. normal DP pulses and normal cap refill in all 4 extremities     Neurologic:  alert & oriented X3 and cranial nerves II-XII symetrically intact.  strength normal in all extremities, sensation intact to light touch, and gait normal. speech fluent without dysarthria or aphasia; follows commands with good comprehension.  Psych:  Oriented X3, memory intact for recent and remote, normally interactive, good eye contact, not anxious appearing, not depressed appearing, and not agitated.      Impression & Recommendations:  Problem # 1:  PREOPERATIVE EXAMINATION (ICD-V72.84) requested by gyn (fogleman) for planned 10/02/10 procedure (due to PMP spotting) exam bening and hx w/o concern but EKG with poor r wave progression - no prior EKG available - no hx pulm or cardiac dz will arrange for stress echo to further statify risk if  no ischemia, it is felt that the surgical risk is low and outweighed by the potential benefit of the surgery.  Therefore, will be medically clear to proceed when scheduling allows (after review of stress echo, if no ischemia)  Problem # 2:  NONSPECIFIC ABNORMAL ELECTROCARDIOGRAM (ICD-794.31) see above preop discussion Orders: Echo- Stress (Stress Echo)  Problem # 3:  ABNORMAL VAGINAL BLEEDING (ICD-626.9) per gyn - planned procedure as above 10/02/10  Problem # 4:  HYPERTENSION (ICD-401.9)  Orders: EKG w/ Interpretation (93000)  BP today: 120/62 Prior BP: 132/72 (03/13/2010)  Labs Reviewed: K+: 3.9 (06/23/2009) Creat: : 0.80 (06/23/2009)   Chol: 218 (03/13/2010)   HDL: 51.00 (03/13/2010)   LDL: 131 (06/23/2009)   TG: 238.0 (03/13/2010)  Problem # 5:  DYSLIPIDEMIA (ICD-272.4)  change to advicor separate meds for cost savings - new erx's done Her updated medication list for this problem includes:    Lovastatin 40 Mg Tabs (Lovastatin) .Marland Kitchen... 1 by mouth at bedtime    Niacin Cr 1000 Mg Cr-tabs (Niacin) .Marland Kitchen... 2 by mouth at bedtime  Labs Reviewed: SGOT: 25 (06/23/2009)   SGPT: 29 (06/23/2009)   HDL:51.00 (03/13/2010), 58  (06/23/2009)  LDL:131 (06/23/2009), 134 (11/22/2008)  Chol:218 (03/13/2010), 229 (06/23/2009)  Trig:238.0 (03/13/2010), 201 (06/23/2009)  Complete Medication List: 1)  Citracal Petites/vitamin D 200-250 Mg-unit Tabs (Calcium citrate-vitamin d) .... Take 2 two times a day 2)  Multivitamins Tabs (Multiple vitamin) .... Take 1 by mouth once daily 3)  Imitrex 100 Mg Tabs (Sumatriptan succinate) .... Take 1 by mouth once daily as needed 4)  Lovastatin 40 Mg Tabs (Lovastatin) .Marland Kitchen.. 1 by mouth at bedtime 5)  Niacin Cr 1000 Mg Cr-tabs (Niacin) .... 2 by mouth at bedtime 6)  Omeprazole 20 Mg Cpdr (Omeprazole) .... Take 1 by mouth once daily 7)  Tramadol Hcl 50 Mg Tabs (Tramadol hcl) .... Take 1 q 4-6 hours as needed 8)  Citalopram Hydrobromide 40 Mg Tabs (Citalopram hydrobromide) .Marland Kitchen.. 1 by mouth once daily 9)  Trazodone Hcl 50 Mg Tabs (Trazodone hcl) .Marland Kitchen.. 1 by mouth at bedtime 10)  Zovirax 5 % Oint (Acyclovir) .... Use as needed 11)  Topamax 100 Mg Tabs (Topiramate) .... Take 1 by mouth once daily 12)  Vitamin D3 1000 Unit Tabs (Cholecalciferol) .... Take 1 by mouth once daily  Patient Instructions: 1)  it was good to see you today. 2)  will arrange for stress echo to look at your heart before the planned surgery with dr. Algie Coffer - we'll make referral to [facility] office for this test. Our office will contact you regarding this appointment once made.  We will then call you with results and notify Dr. Algie Coffer that you are clear to  proceed as planned if everything is normal - 3)  will change advicor to lovastatin and niacin for cost savings and send to [facility] as requested - also refill omeprazole and imitrex 4)  Please schedule follow-up appointment in 3-4 months to review cholesterol and medications, call sooner if problems.  Prescriptions: IMITREX 100 MG TABS (SUMATRIPTAN SUCCINATE) take 1 by mouth once daily as needed  #27 x 3   Entered and Authorized by:   Newt Lukes MD   Signed by:    Newt Lukes MD on 09/15/2010   Method used:   Printed then faxed to ...       [facility] (mail[facility])       [address]       [place], [place]  [address]       Ph: [phone]       Fax: [phone]   RxID:   [id number] OMEPRAZOLE 20 MG CPDR (OMEPRAZOLE) take 1 by mouth once daily  #90 x 3   Entered and Authorized by:   Newt Lukes MD   Signed by:   Newt Lukes MD on 09/15/2010   Method used:   Printed then faxed to ...       [facility] (mail[facility]order)       [address] [place], [place]  [address]       Ph: [phone]       Fax: [phone]   RxID:   [id number] LOVASTATIN 40 MG TABS (LOVASTATIN) 1 by mouth at bedtime  #90 x 3   Entered and Authorized by:   Newt Lukes MD   Signed by:   Newt Lukes MD on 09/15/2010   Method used:   Printed then faxed to ...       [facility] (mail-order)       [address] [place], [place]  [address]       Ph: [phone]       Fax: [phone]   RxID:   [id number] NIACIN CR 1000 MG CR-TABS (NIACIN) 2 by mouth at bedtime  #180 x 3   Entered and Authorized by:   Newt Lukes MD   Signed by:   Newt Lukes MD on 09/15/2010   Method used:   Printed then faxed to ...       [facility] (mail[facility])       [address] [place], [place]  [address]       Ph: [phone]       Fax: [phone]   RxID:   [id number]    Orders Added: 1)  EKG w/ Interpretation [93000] 2)  Echo- Stress [Stress Echo] 3)  Consultation Level III [35361] 4)  Est. Patient Level IV [44315]

## 2010-09-25 ENCOUNTER — Encounter (HOSPITAL_COMMUNITY)
Admission: RE | Admit: 2010-09-25 | Discharge: 2010-09-25 | Disposition: A | Discharge status: Home / Self Care | Payer: Medicare Other | Source: Ambulatory Visit | Attending: Obstetrics | Admitting: Obstetrics

## 2010-09-25 ENCOUNTER — Ambulatory Visit (HOSPITAL_COMMUNITY): Payer: Medicare Other | Attending: Internal Medicine

## 2010-09-25 ENCOUNTER — Encounter: Payer: Self-pay | Admitting: Internal Medicine

## 2010-09-25 ENCOUNTER — Other Ambulatory Visit (HOSPITAL_COMMUNITY): Payer: Self-pay

## 2010-09-25 ENCOUNTER — Encounter (INDEPENDENT_AMBULATORY_CARE_PROVIDER_SITE_OTHER): Payer: Self-pay | Admitting: *Deleted

## 2010-09-25 DIAGNOSIS — R0989 Other specified symptoms and signs involving the circulatory and respiratory systems: Secondary | ICD-10-CM | POA: Insufficient documentation

## 2010-09-25 DIAGNOSIS — R9431 Abnormal electrocardiogram [ECG] [EKG]: Secondary | ICD-10-CM | POA: Insufficient documentation

## 2010-09-25 DIAGNOSIS — R5381 Other malaise: Secondary | ICD-10-CM | POA: Insufficient documentation

## 2010-09-25 DIAGNOSIS — Z01812 Encounter for preprocedural laboratory examination: Secondary | ICD-10-CM | POA: Insufficient documentation

## 2010-09-25 DIAGNOSIS — R0609 Other forms of dyspnea: Secondary | ICD-10-CM | POA: Insufficient documentation

## 2010-09-25 DIAGNOSIS — Z01810 Encounter for preprocedural cardiovascular examination: Secondary | ICD-10-CM

## 2010-09-25 LAB — BASIC METABOLIC PANEL
BUN: 19 mg/dL (ref 6–23)
Creatinine, Ser: 0.77 mg/dL (ref 0.4–1.2)
GFR calc Af Amer: >60 mL/min (ref >60)
GFR calc non Af Amer: >60 mL/min (ref >60)
Potassium: 3.9 mEq/L (ref 3.5–5.1)

## 2010-09-25 LAB — CBC
MCV: 93.7 fL (ref 78.0–100.0)
Platelets: 254 10*3/uL (ref 150–400)
RDW: 12.9 % (ref 11.5–15.5)
WBC: 9 10*3/uL (ref 4.0–10.5)

## 2010-09-30 ENCOUNTER — Telehealth: Payer: Self-pay | Admitting: Internal Medicine

## 2010-10-01 NOTE — Letter (Signed)
Summary: Medical Clearance for Surgical / [facility]  Medical Clearance for Surgical / [facility]   Imported By: Lennie Odor 09/21/2010 11:22:57  _____________________________________________________________________  External Attachment:    Type:   Image     Comment:   External Document

## 2010-10-01 NOTE — Progress Notes (Signed)
Summary: Stress Echo Pre-Procedure  Phone Note Outgoing Call Call back at Home Phone [phone]   Call placed by: Antionette Char RN,  September 23, 2010 4:04 PM Call placed to: Patient Reason for Call: Confirm/change Appt Summary of Call: Stress Echo instructions given to patient.

## 2010-10-01 NOTE — Miscellaneous (Signed)
Summary: Saline lock for echo  Clinical Lists Changes   20g Saline lock to (R) AC established for definity infusion for Echo by Stanton Kidney, EMT-P. 09/25/10

## 2010-10-02 ENCOUNTER — Ambulatory Visit (HOSPITAL_COMMUNITY)
Admission: RE | Admit: 2010-10-02 | Discharge: 2010-10-02 | Disposition: A | Discharge status: Home / Self Care | Payer: Medicare Other | Source: Ambulatory Visit | Attending: Obstetrics | Admitting: Obstetrics

## 2010-10-02 ENCOUNTER — Other Ambulatory Visit: Payer: Self-pay | Admitting: Obstetrics

## 2010-10-02 ENCOUNTER — Ambulatory Visit (HOSPITAL_COMMUNITY): Admission: RE | Admit: 2010-10-02 | Payer: Self-pay | Source: Home / Self Care | Admitting: Obstetrics

## 2010-10-02 DIAGNOSIS — N950 Postmenopausal bleeding: Secondary | ICD-10-CM | POA: Insufficient documentation

## 2010-10-02 DIAGNOSIS — Z01812 Encounter for preprocedural laboratory examination: Secondary | ICD-10-CM | POA: Insufficient documentation

## 2010-10-02 DIAGNOSIS — Z01818 Encounter for other preprocedural examination: Secondary | ICD-10-CM | POA: Insufficient documentation

## 2010-10-02 DIAGNOSIS — N840 Polyp of corpus uteri: Secondary | ICD-10-CM | POA: Insufficient documentation

## 2010-10-02 LAB — ABO/RH: ABO/RH(D): O POS

## 2010-10-02 LAB — TYPE AND SCREEN: ABO/RH(D): O POS

## 2010-10-07 NOTE — Progress Notes (Signed)
Summary: test results  Phone Note Call from Patient Call back at Home Phone [phone]   Caller: Patient Summary of Call: Pt is requesting results of stress test done on Monday Initial call taken by: Brenton Grills CMA Duncan Dull),  September 30, 2010 3:03 PM  Follow-up for Phone Call        i don't have these results - not in EMR - please contact cardiology and obtain copy of these results for me to review - thanks Follow-up by: Newt Lukes MD,  September 30, 2010 4:25 PM  Additional Follow-up for Phone Call Additional follow up Details #1::        Called cardiology medical records spoke with Selena Batten. Faxing report. Have recived putting on md desk Additional Follow-up by: Orlan Leavens RMA,  September 30, 2010 4:43 PM    Additional Follow-up for Phone Call Additional follow up Details #2::    Stress Echo Faxed to [facility] @ [phone] Follow-up by: Nadean Corwin February 15,2012  Additional Follow-up for Phone Call Additional follow up Details #3:: Details for Additional Follow-up Action Taken: thanks - results reviewed - normal, no ischemia - please tell pt same - ok to proceed with surg plans - will fax copy of results to gyn, then scan into EMR - - thanks - Newt Lukes MD  September 30, 2010 4:58 PM     Notified pt with results also fax copy to Dr. Algie Coffer @ [phone] Additional Follow-up by: Orlan Leavens RMA,  October 01, 2010 9:19 AM

## 2010-10-23 NOTE — Op Note (Signed)
NAMEJAHANNA, [name]                 ACCOUNT NO.:  [id number]  MEDICAL RECORD NO.:  [id number]           PATIENT TYPE:  O  LOCATION:  [facility]                          [facility]ACILITY:  [facility]  PHYSICIAN:  Lendon Colonel, MD   DATE OF BIRTH:  Mar 05, 1941  DATE OF PROCEDURE:  10/02/2010 DATE OF DISCHARGE:                              OPERATIVE REPORT   PREOPERATIVE DIAGNOSIS:  Endometrial polyp, postmenopausal bleeding.  POSTOPERATIVE DIAGNOSIS:  Endometrial polyp, postmenopausal bleeding.  PROCEDURE:  D and C, hysteroscopy and polypectomy.  SURGEON:  Lendon Colonel, MD  ASSISTANT:  None.  ANESTHESIA:  General.  FINDINGS:  Normal vagina, prolapse of the urethral meatus, atrophic vaginal walls.  Normal-appearing cervix.  No adnexal masses on bimanual exam.  On hysteroscopy, bilateral ostia visualized, endometrium thin, two prominent polyps both about 1 x 0.5 cm, one arising from the posterior fundal wall, one arising from the posterior right lateral wall.  SPECIMEN:  EMC and polyps to pathology.  ANTIBIOTICS:  None.  ESTIMATED BLOOD LOSS:  Minimal, fluid deficit 50 mL of saline.  COMPLICATIONS:  None.  INDICATIONS:  This is an 70 year old patient who has complained of intermittent vaginal spotting, thickened endometrial stripe noted on ultrasound despite a normal endometrial biopsy.  Sonohysterogram revealed two polyps.  Risks, benefits, and alternatives of the procedure were discussed with the patient.  The patient decided to proceed for OR hysteroscopy and polypectomy.  PROCEDURE:  After informed consent was obtained, the patient was taken to the operating room where general anesthesia was administered without difficulty.  She was prepped and draped in the normal sterile fashion in the dorsal supine lithotomy position.  A bimanual examination was done to assess the size and the position of the uterus.  A sterile speculum was inserted into the vagina.  A solution of 1% Nesacaine  2 mL at 12 o'clock of the cervix, 8 mL at 7 o'clock in the cervical-paracervical junction and 10 mL at 5 o'clock in the cervical-paracervical junction were used.  The uterus was sounded to 7 cm.  The small hysteroscope was inserted easily into the uterus.  This was accomplished with the use of preoperative Cytotec.  Survey of the endometrial cavity was done, bilateral ostia were seen.  The endometrium was thin.  Two prominent polyps were noted.  The hysteroscope was removed.  Polyp forceps were placed into the uterus.  Several passes were done opening with polyp forceps, twisting, clamping and removing.  All polypoid tissue was felt to be removed.  Repeat hysteroscopy revealed a small amount of polyp tissue at the right posterior lateral position and again the polyp forceps were placed and additional removal of additional tissue was done.  Endometrial curettage was then carried out.  Specimen was sent separately from the polyps.  Repeat hysteroscopy revealed an intact uterus and removal of all polypoid tissue.  Hemostasis was noted.Instruments were removed from the vagina.  Tenaculum was removed from anterior lip of the cervix.  Silver nitrate was used to control hemostasis of the anterior cervical lip and the procedure was terminated.  The patient tolerated the procedure well.  Sponge, lap, and needle counts were correct x3 and the patient was taken to recovery room in stable condition.     Lendon Colonel, MD     KAF/MEDQ  D:  10/02/2010  T:  10/03/2010  Job:  [id number]  Electronically Signed by Noland Fordyce MD on 10/22/2010 07:47:28 PM

## 2010-11-23 ENCOUNTER — Other Ambulatory Visit: Payer: Self-pay | Admitting: Internal Medicine

## 2010-12-31 ENCOUNTER — Encounter: Payer: Self-pay | Admitting: Internal Medicine

## 2011-01-14 ENCOUNTER — Ambulatory Visit (INDEPENDENT_AMBULATORY_CARE_PROVIDER_SITE_OTHER): Payer: Medicare Other | Admitting: Internal Medicine

## 2011-01-14 ENCOUNTER — Encounter: Payer: Self-pay | Admitting: Internal Medicine

## 2011-01-14 ENCOUNTER — Other Ambulatory Visit: Payer: Self-pay | Admitting: Internal Medicine

## 2011-01-14 ENCOUNTER — Other Ambulatory Visit (INDEPENDENT_AMBULATORY_CARE_PROVIDER_SITE_OTHER): Payer: Medicare Other

## 2011-01-14 DIAGNOSIS — M4800 Spinal stenosis, site unspecified: Secondary | ICD-10-CM

## 2011-01-14 DIAGNOSIS — E785 Hyperlipidemia, unspecified: Secondary | ICD-10-CM

## 2011-01-14 DIAGNOSIS — L988 Other specified disorders of the skin and subcutaneous tissue: Secondary | ICD-10-CM

## 2011-01-14 DIAGNOSIS — I10 Essential (primary) hypertension: Secondary | ICD-10-CM

## 2011-01-14 DIAGNOSIS — R238 Other skin changes: Secondary | ICD-10-CM

## 2011-01-14 LAB — LIPID PANEL
Total CHOL/HDL Ratio: 4
VLDL: 30.8 mg/dL (ref 0.0–40.0)

## 2011-01-14 LAB — LDL CHOLESTEROL, DIRECT: Direct LDL: 174.2 mg/dL

## 2011-01-14 MED ORDER — TRAMADOL HCL 50 MG PO TABS
50.0000 mg | ORAL_TABLET | Freq: Four times a day (QID) | ORAL | Status: DC | PRN
Start: 2011-01-14 — End: 2011-07-02

## 2011-01-14 MED ORDER — ATORVASTATIN CALCIUM 40 MG PO TABS: 40.0000 mg | ORAL_TABLET | Freq: Every day | ORAL | Status: DC

## 2011-01-14 MED ORDER — TRAZODONE HCL 50 MG PO TABS: 50.0000 mg | ORAL_TABLET | Freq: Every day | ORAL | Status: DC

## 2011-01-14 NOTE — Progress Notes (Signed)
  Subjective:    Patient ID: [name], female    DOB: 1941/07/22, 70 y.o.   MRN: [id number]  HPI  Here for follow up - reviewed chronic med issues:   chronic low back pain - sees ortho for pain mgmt of same - occ tramadol use if severe pain - no trauma or change in nature of pain, no radiation   dyslipidemia - on statin + niacin - reports compliance with ongoing medical treatment and no changes in medication dose or frequency. denies adverse side effects related to current therapy. no GI or new MSkel c/o   migraines - reports compliance with ongoing medical treatment and no changes in medication dose or frequency. denies adverse side effects related to current therapy but would like to wean off topamax if possible -has restablished with neuro for review of symptoms and trigger point shots at [facility]   depression - begun on lexapro 09/2009 for same - initially much improved on tx - less anxious, less sad, less tearful - changed to generic citalopram 11/2009 due to high cost of lexapro -then increase dose 02/2010 to improve "nervous" spells - doing well   Past Medical History  Diagnosis Date  . DYSLIPIDEMIA 12/29/2009  . DEPRESSION 12/29/2009  . MIGRAINE HEADACHE 12/29/2009  . HYPERTENSION 12/29/2009  . GERD 01/09/2010  . DIVERTICULOSIS, COLON 01/09/2010  . ABNORMAL VAGINAL BLEEDING 07/07/2010  . SPINAL STENOSIS 01/09/2010  . PLANTAR FASCIITIS 01/09/2010  . Other symptoms involving urinary system 03/15/2010  . NONSPECIFIC ABNORMAL ELECTROCARDIOGRAM 09/15/2010  . Multinodular goiter      Review of Systems  Constitutional: Negative for fatigue.  Respiratory: Negative for shortness of breath.   Cardiovascular: Negative for chest pain.       Objective:   Physical Exam BP 128/78  Pulse 80  Temp(Src) 98 F (36.7 C) (Oral)  Ht 5\' 4"  (1.626 m)  Wt 158 lb 6.4 oz (71.85 kg)  BMI 27.19 kg/m2  SpO2 98%  Physical Exam  Constitutional: She is oriented to person, place, and time.  She appears well-developed and well-nourished. No distress.  Neck: Normal range of motion. Neck supple. No JVD present. No thyromegaly present.  Cardiovascular: Normal rate, regular rhythm and normal heart sounds.  No murmur heard. No BLE edema. Pulmonary/Chest: Effort normal and breath sounds normal. No respiratory distress. She has no wheezes. Skin: Pink papule inner left nostril -skin tag right side of neck. Skin is warm and dry. No rash noted. No erythema.  Psychiatric: She has a normal mood and affect. Her behavior is normal. Judgment and thought content normal.   Lab Results  Component Value Date   WBC 9.0 09/25/2010   HGB 14.2 09/25/2010   HCT 42.9 09/25/2010   PLT 254 09/25/2010   CHOL 218* 03/13/2010   TRIG 238.0* 03/13/2010   HDL 51.00 03/13/2010   LDLDIRECT 137.5 03/13/2010   ALT 29 06/23/2009   AST 25 06/23/2009   NA 138 09/25/2010   K 3.9 09/25/2010   CL 106 09/25/2010   CREATININE 0.77 09/25/2010   BUN 19 09/25/2010   CO2 26 09/25/2010   TSH 1.10 08/25/2009         Assessment & Plan:  See problem list. Medications and labs reviewed today. Skin papule left nares - ?BCC or other- pt request derm eval - will refer to Danella Deis (prior eval for same)

## 2011-01-14 NOTE — Patient Instructions (Signed)
It was good to see you today. Medications reviewed, no changes at this time. we'll make referral to Dr. Danella Deis. Our office will contact you regarding appointment(s) once made. Let us know if you need referral to [facility] as discussed Test(s) ordered today. Your results will be called to you after review (48-72hours after test completion). If any changes need to be made, you will be notified at that time. Refill on medication(s) as discussed today. Please schedule followup in 6 months, call sooner if problems.

## 2011-01-14 NOTE — Assessment & Plan Note (Signed)
BP Readings from Last 3 Encounters:  01/14/11 128/78  09/15/10 120/62  03/13/10 132/72   The current medical regimen is effective;  continue present plan and medications.

## 2011-01-14 NOTE — Assessment & Plan Note (Signed)
On statin + niacin - recheck lipids today The current medical regimen is effective;  continue present plan and medications.

## 2011-01-14 NOTE — Assessment & Plan Note (Signed)
Follows with dr. Regino Schultze and PT for same - currently few symptoms - refill on tramadol done today

## 2011-04-02 ENCOUNTER — Encounter: Payer: Self-pay | Admitting: Internal Medicine

## 2011-07-02 ENCOUNTER — Other Ambulatory Visit: Payer: Self-pay | Admitting: Internal Medicine

## 2011-07-16 ENCOUNTER — Other Ambulatory Visit (INDEPENDENT_AMBULATORY_CARE_PROVIDER_SITE_OTHER): Payer: Medicare Other

## 2011-07-16 ENCOUNTER — Encounter: Payer: Self-pay | Admitting: Internal Medicine

## 2011-07-16 ENCOUNTER — Ambulatory Visit (INDEPENDENT_AMBULATORY_CARE_PROVIDER_SITE_OTHER): Payer: Medicare Other | Admitting: Internal Medicine

## 2011-07-16 DIAGNOSIS — E785 Hyperlipidemia, unspecified: Secondary | ICD-10-CM

## 2011-07-16 DIAGNOSIS — J069 Acute upper respiratory infection, unspecified: Secondary | ICD-10-CM

## 2011-07-16 DIAGNOSIS — I10 Essential (primary) hypertension: Secondary | ICD-10-CM

## 2011-07-16 LAB — LIPID PANEL
Total CHOL/HDL Ratio: 3
Triglycerides: 103 mg/dL (ref 0.0–149.0)

## 2011-07-16 MED ORDER — HYDROCODONE-HOMATROPINE 5-1.5 MG/5ML PO SYRP: 5.0000 mL | ORAL_SOLUTION | Freq: Four times a day (QID) | ORAL | Status: AC | PRN

## 2011-07-16 MED ORDER — AZITHROMYCIN 250 MG PO TABS: ORAL_TABLET | ORAL | Status: AC

## 2011-07-16 MED ORDER — IPRATROPIUM BROMIDE 0.03 % NA SOLN: 2.0000 | Freq: Three times a day (TID) | NASAL | Status: DC

## 2011-07-16 NOTE — Assessment & Plan Note (Signed)
BP Readings from Last 3 Encounters:  07/16/11 138/72  01/14/11 128/78  09/15/10 120/62   The current medical regimen is effective;  continue present plan and medications.

## 2011-07-16 NOTE — Progress Notes (Signed)
Subjective:    Patient ID: [name], female    DOB: 18-Jan-1941, 70 y.o.   MRN: [id number]  URI  This is a new problem. The current episode started in the past 7 days. The problem has been gradually worsening. Associated symptoms include rhinorrhea, sinus pain, sneezing and a sore throat. Pertinent negatives include no chest pain. She has tried acetaminophen for the symptoms. The treatment provided mild relief.    Also here for follow up - reviewed chronic med issues:   chronic low back pain - sees ortho/physiatry and physical therapy for pain mgmt of same - occ tramadol use if severe pain - no trauma or change in nature of pain, no radiation   dyslipidemia - on statin + niacin, changed from Mevacor to generic Lipitor due to poor control may 2012 - reports compliance with ongoing medical treatment. denies adverse side effects related to current therapy.   migraines - reports compliance with ongoing medical treatment and no changes in medication dose or frequency. denies adverse side effects related to current therapy but would like to wean off topamax if possible -has restablished with neuro for review of symptoms and trigger point shots [facility]   depression - begun on lexapro 09/2009 for same - initially much improved on tx - less anxious, less sad, less tearful - changed to generic citalopram 11/2009 due to high cost of lexapro -then increase dose 02/2010 to improve "nervous" spells - doing well   Past Medical History  Diagnosis Date  . MIGRAINE HEADACHE   . DIVERTICULOSIS, COLON   . SPINAL STENOSIS   . PLANTAR FASCIITIS   . NONSPECIFIC ABNORMAL ELECTROCARDIOGRAM 09/15/2010    normal stress test 09/2010  . DEPRESSION   . GERD   . DYSLIPIDEMIA   . HYPERTENSION   . Multinodular goiter      Review of Systems  Constitutional: Negative for fatigue.  HENT: Positive for sore throat, rhinorrhea and sneezing.   Respiratory: Negative for shortness of breath.     Cardiovascular: Negative for chest pain.       Objective:   Physical Exam  BP 138/72  Pulse 92  Temp(Src) 98.5 F (36.9 C) (Oral)  Resp 16  Ht 5\' 4"  (1.626 m)  Wt 155 lb 4 oz (70.421 kg)  BMI 26.65 kg/m2  SpO2 98% Wt Readings from Last 3 Encounters:  07/16/11 155 lb 4 oz (70.421 kg)  01/14/11 158 lb 6.4 oz (71.85 kg)  09/15/10 159 lb 6.4 oz (72.303 kg)   Constitutional: She is mildly fatigued  - otherwise appears well-developed and well-nourished.   HEENT: Glassy eyes, no conjunctivitis. Rhinorrhea bilateral nares, oropharynx mild to moderate erythema, no exudate. Tenderness over bilateral sinuses to palpation Neck: Normal range of motion. Mild anterior LAD. Neck supple. No JVD present. No thyromegaly present.  Cardiovascular: Normal rate, regular rhythm and normal heart sounds.  No murmur heard. No BLE edema. Pulmonary/Chest: Effort normal and breath sounds normal. No respiratory distress. She has no wheezes. Psychiatric: She has a normal mood and affect. Her behavior is normal. Judgment and thought content normal.   Lab Results  Component Value Date   WBC 9.0 09/25/2010   HGB 14.2 09/25/2010   HCT 42.9 09/25/2010   PLT 254 09/25/2010   CHOL 227* 01/14/2011   TRIG 154.0* 01/14/2011   HDL 56.90 01/14/2011   LDLDIRECT 174.2 01/14/2011   ALT 29 06/23/2009   AST 25 06/23/2009   NA 138 09/25/2010   K 3.9 09/25/2010  CL 106 09/25/2010   CREATININE 0.77 09/25/2010   BUN 19 09/25/2010   CO2 26 09/25/2010   TSH 1.10 08/25/2009         Assessment & Plan:  See problem list. Medications and labs reviewed today.  URI, viral syndrome>> explained lack of efficacy for antibiotics in viral disease>> but paper written prescription for Z-Pak provided to patient to fill if symptoms unimproved or worse in next 7 days Symptomatic care recommended prescription for nasal Atrovent and narcotic cough suppression Continue over-the-counter cough and cold remedies as ongoing, hydration and rest

## 2011-07-16 NOTE — Assessment & Plan Note (Signed)
On statin + niacin, range from Mevacor to generic atorvastatin may 2012 due to poor control recheck lipids today; adjust medications as needed

## 2011-07-16 NOTE — Patient Instructions (Signed)
It was good to see you today. Test(s) ordered today. Your results will be called to you after review (48-72hours after test completion). If any changes need to be made, you will be notified at that time. If you develop worsening symptoms or fever, we can reconsider antibiotics (written paper prescription for Z-Pak antibiotics provided to you), but it does not appear necessary to use antibiotics at this time. Hydromet prescription cough syrup to use at night and Atrovent nasal spray to use for runny nose symptoms -Your prescription(s) have been submitted to your pharmacy. Please take as directed and contact our office if you believe you are having problem(s) with the medication(s). Continue over-the-counter cough and cold medication as you're doing Please schedule followup in 6 months for blood pressure and cholesterol check, call sooner if problems.

## 2011-08-04 ENCOUNTER — Other Ambulatory Visit: Payer: Self-pay | Admitting: Internal Medicine

## 2011-08-04 DIAGNOSIS — Z1231 Encounter for screening mammogram for malignant neoplasm of breast: Secondary | ICD-10-CM

## 2011-09-02 ENCOUNTER — Ambulatory Visit: Payer: Medicare Other

## 2011-09-14 ENCOUNTER — Ambulatory Visit
Admission: RE | Admit: 2011-09-14 | Discharge: 2011-09-14 | Disposition: A | Discharge status: Home / Self Care | Payer: Medicare Other | Source: Ambulatory Visit | Attending: Internal Medicine | Admitting: Internal Medicine

## 2011-09-14 DIAGNOSIS — Z1231 Encounter for screening mammogram for malignant neoplasm of breast: Secondary | ICD-10-CM

## 2011-09-24 ENCOUNTER — Encounter: Payer: Self-pay | Admitting: Internal Medicine

## 2011-09-24 ENCOUNTER — Ambulatory Visit (INDEPENDENT_AMBULATORY_CARE_PROVIDER_SITE_OTHER)
Admission: RE | Admit: 2011-09-24 | Discharge: 2011-09-24 | Disposition: A | Discharge status: Home / Self Care | Payer: Medicare Other | Source: Ambulatory Visit | Attending: Internal Medicine | Admitting: Internal Medicine

## 2011-09-24 ENCOUNTER — Ambulatory Visit (INDEPENDENT_AMBULATORY_CARE_PROVIDER_SITE_OTHER): Payer: Medicare Other | Admitting: Internal Medicine

## 2011-09-24 VITALS — BP 142/78 | HR 76 | Temp 97.2°F | Wt 161.1 lb

## 2011-09-24 DIAGNOSIS — M542 Cervicalgia: Secondary | ICD-10-CM

## 2011-09-24 DIAGNOSIS — M5030 Other cervical disc degeneration, unspecified cervical region: Secondary | ICD-10-CM

## 2011-09-24 MED ORDER — DIAZEPAM 5 MG PO TABS: 5.0000 mg | ORAL_TABLET | Freq: Every evening | ORAL | Status: AC | PRN

## 2011-09-24 NOTE — Progress Notes (Signed)
Subjective:    Patient ID: [name], female    DOB: 1940-09-26, 71 y.o.   MRN: [id number]  Neck Pain  This is a recurrent problem. The current episode started more than 1 month ago. The problem occurs constantly. The problem has been waxing and waning. The pain is associated with nothing. The pain is present in the right side. The quality of the pain is described as cramping and shooting. The pain is at a severity of 5/10. The pain is moderate. The symptoms are aggravated by twisting. The pain is same all the time. Stiffness is present all day. Pertinent negatives include no chest pain, fever, headaches, pain with swallowing, trouble swallowing, visual change or weakness. She has tried nothing for the symptoms.    Also reviewed chronic med issues:   chronic low back pain - sees ortho for pain mgmt of same - occ tramadol use if severe pain - no trauma or change in nature of pain, no radiation - working with PT  dyslipidemia - on statin + niacin - reports compliance with ongoing medical treatment and no changes in medication dose or frequency. denies adverse side effects related to current therapy.   migraines - reports compliance with ongoing medical treatment and no changes in medication dose or frequency. denies adverse side effects related to current therapy but would like to wean off topamax if possible -has restabl[facility] for [facility] of symptoms and trigger point shots prn  depression - begun on lexapro 09/2009 for same - initially much improved on tx - less anxious, less sad, less tearful - changed to generic citalopram 11/2009 due to high cost of lexapro -then increase dose 02/2010 to improve "nervous" spells > stopped summer 2012 as "stopped working and i don't need it"  Past Medical History  Diagnosis Date  . MIGRAINE HEADACHE   . DIVERTICULOSIS, COLON   . SPINAL STENOSIS   . PLANTAR FASCIITIS   . NONSPECIFIC ABNORMAL ELECTROCARDIOGRAM 09/15/2010   normal stress test 09/2010  . DEPRESSION   . GERD   . DYSLIPIDEMIA   . HYPERTENSION   . Multinodular goiter      Review of Systems  Constitutional: Negative for fever and fatigue.  HENT: Positive for neck pain. Negative for trouble swallowing.   Respiratory: Negative for shortness of breath.   Cardiovascular: Negative for chest pain.  Neurological: Negative for weakness and headaches.       Objective:   Physical Exam  BP 142/78  Pulse 76  Temp(Src) 97.2 F (36.2 C) (Oral)  Wt 161 lb 1.9 oz (73.084 kg)  SpO2 97% Wt Readings from Last 3 Encounters:  09/24/11 161 lb 1.9 oz (73.084 kg)  07/16/11 155 lb 4 oz (70.421 kg)  01/14/11 158 lb 6.4 oz (71.85 kg)   Constitutional: She appears well-developed and well-nourished. No distress.  Neck: spasm of right neck muscles. Normal range of motion. Neck supple. No JVD present. No thyromegaly present.  Cardiovascular: Normal rate, regular rhythm and normal heart sounds.  No murmur heard. No BLE edema. Pulmonary/Chest: Effort normal and breath sounds normal.  Neuro: no myelopathy, good strength BUE and normal balance/gait. Psychiatric: She has a normal mood and affect. Her behavior is normal. Judgment and thought content normal.   Lab Results  Component Value Date   WBC 9.0 09/25/2010   HGB 14.2 09/25/2010   HCT 42.9 09/25/2010   PLT 254 09/25/2010   CHOL 199 07/16/2011   TRIG 103.0 07/16/2011   HDL  57.50 07/16/2011   LDLDIRECT 174.2 01/14/2011   ALT 29 06/23/2009   AST 25 06/23/2009   NA 138 09/25/2010   K 3.9 09/25/2010   CL 106 09/25/2010   CREATININE 0.77 09/25/2010   BUN 19 09/25/2010   CO2 26 09/25/2010   TSH 1.10 08/25/2009         Assessment & Plan:   Neck pain - suspect muscle spasm related to DDD - xray today to look for spurs or other new change - treat with muscle relaxers and continue tramadol or tylneol OTC NSAIDs as ongoing - if unimproved, will advise or help arrange follow up at ortho Regino Schultze) as needed to consider  other tx

## 2011-09-24 NOTE — Patient Instructions (Signed)
It was good to see you today. Xray of neck  ordered today. Your results will be called to you after review (48-72hours after test completion). If any changes need to be made, you will be notified at that time. Use Valium as needed at night for neck pain and muscle spasm symptoms - Your prescription(s) have been submitted to your pharmacy. Please take as directed and contact our office if you believe you are having problem(s) with the medication(s). If pain is worse or unimproved in next 7-10 days, call and we will make referral to your spine specialist as discussed

## 2011-11-11 ENCOUNTER — Telehealth: Payer: Self-pay | Admitting: *Deleted

## 2011-11-11 MED ORDER — LOVASTATIN 40 MG PO TABS: 40.0000 mg | ORAL_TABLET | Freq: Every day | ORAL | Status: DC

## 2011-11-11 NOTE — Telephone Encounter (Signed)
Left msg on vm was getting med through [facility] has expired. Wanting to get a 90 day on lovastatin [facility].Marland KitchenMarland KitchenMarland KitchenInform pt [facility].... 11/11/11@12 :11pm/LMB

## 2011-11-24 ENCOUNTER — Other Ambulatory Visit: Payer: Self-pay | Admitting: Internal Medicine

## 2012-01-08 ENCOUNTER — Other Ambulatory Visit: Payer: Self-pay | Admitting: Internal Medicine

## 2012-01-12 ENCOUNTER — Other Ambulatory Visit: Payer: Self-pay | Admitting: Internal Medicine

## 2012-01-13 ENCOUNTER — Other Ambulatory Visit (INDEPENDENT_AMBULATORY_CARE_PROVIDER_SITE_OTHER): Payer: Medicare Other

## 2012-01-13 ENCOUNTER — Encounter: Payer: Self-pay | Admitting: Internal Medicine

## 2012-01-13 ENCOUNTER — Ambulatory Visit (INDEPENDENT_AMBULATORY_CARE_PROVIDER_SITE_OTHER): Payer: Medicare Other | Admitting: Internal Medicine

## 2012-01-13 ENCOUNTER — Ambulatory Visit (INDEPENDENT_AMBULATORY_CARE_PROVIDER_SITE_OTHER)
Admission: RE | Admit: 2012-01-13 | Discharge: 2012-01-13 | Disposition: A | Discharge status: Home / Self Care | Payer: Medicare Other | Source: Ambulatory Visit | Attending: Internal Medicine | Admitting: Internal Medicine

## 2012-01-13 VITALS — BP 152/72 | HR 80 | Temp 97.7°F | Ht 64.0 in | Wt 153.1 lb

## 2012-01-13 DIAGNOSIS — M4800 Spinal stenosis, site unspecified: Secondary | ICD-10-CM

## 2012-01-13 DIAGNOSIS — M949 Disorder of cartilage, unspecified: Secondary | ICD-10-CM

## 2012-01-13 DIAGNOSIS — M549 Dorsalgia, unspecified: Secondary | ICD-10-CM

## 2012-01-13 DIAGNOSIS — M8580 Other specified disorders of bone density and structure, unspecified site: Secondary | ICD-10-CM

## 2012-01-13 DIAGNOSIS — E785 Hyperlipidemia, unspecified: Secondary | ICD-10-CM

## 2012-01-13 LAB — LIPID PANEL
Total CHOL/HDL Ratio: 4
VLDL: 46.4 mg/dL — ABNORMAL HIGH (ref 0.0–40.0)

## 2012-01-13 MED ORDER — TRAZODONE HCL 50 MG PO TABS: 50.0000 mg | ORAL_TABLET | Freq: Every day | ORAL | Status: DC

## 2012-01-13 MED ORDER — TOPIRAMATE 100 MG PO TABS: 100.0000 mg | ORAL_TABLET | Freq: Every day | ORAL | Status: DC

## 2012-01-13 MED ORDER — SUMATRIPTAN SUCCINATE 100 MG PO TABS: 100.0000 mg | ORAL_TABLET | ORAL | Status: DC | PRN

## 2012-01-13 MED ORDER — LOVASTATIN 40 MG PO TABS: 40.0000 mg | ORAL_TABLET | Freq: Every day | ORAL | Status: DC

## 2012-01-13 MED ORDER — CYCLOBENZAPRINE HCL 5 MG PO TABS: 5.0000 mg | ORAL_TABLET | Freq: Three times a day (TID) | ORAL | Status: AC | PRN

## 2012-01-13 MED ORDER — NIACIN ER (ANTIHYPERLIPIDEMIC) 1000 MG PO TBCR: 1000.0000 mg | EXTENDED_RELEASE_TABLET | Freq: Every day | ORAL | Status: DC

## 2012-01-13 NOTE — Assessment & Plan Note (Signed)
Has followed with dr. Regino Schultze and PT for same -  But increase in thoracic and low back symptoms - add muscle relaxer, continue tramadol and refer for new opinion Also check plain films rule out compression fx and check DEXA (not done in >5 years, PMP)

## 2012-01-13 NOTE — Patient Instructions (Signed)
It was good to see you today. Test(s) ordered today - xray and labs. Your results will be called to you after review (48-72hours after test completion). If any changes need to be made, you will be notified at that time. we'll make referral to new spine specialist and for bone density. Our office will contact you regarding appointment(s) once made. Medications reviewed, no changes at this time. Refill on medication(s) as discussed today. Please schedule followup in 6 months for blood pressure and cholesterol check, call sooner if problems.

## 2012-01-13 NOTE — Assessment & Plan Note (Signed)
On statin + niacin, range from Mevacor to generic atorvastatin 12/2010 due to poor control recheck lipids today; adjust medications as needed

## 2012-01-13 NOTE — Progress Notes (Signed)
Subjective:    Patient ID: [name], female    DOB: 1941-04-13, 71 y.o.   MRN: [id number]  HPI  here for follow up - reviewed chronic medical issues:   chronic low back pain - sees ortho/physiatry and physical therapy for pain mgmt of same, but would like 2nd opinion - occasional tramadol use if severe pain - no trauma but increase in mid-t spine pain, no radiation   dyslipidemia - on statin + niacin, changed from Mevacor to generic Lipitor due to poor control 12/2010 - reports compliance with ongoing medical treatment. denies adverse side effects related to current therapy.   migraines - reports compliance with ongoing medical treatment and no changes in medication dose or frequency. denies adverse side effects related to current therapy -? wean off topamax if possible -has restablished with neuro for review of symptoms and [facility]   depression - begun on lexapro 09/2009 for same - initially much improved on tx - less anxious, less sad, less tearful - changed to generic citalopram 11/2009 due to high cost of lexapro -then increase dose 02/2010 to improve "nervous" spells - doing well   Past Medical History  Diagnosis Date  . MIGRAINE HEADACHE   . DIVERTICULOSIS, COLON   . SPINAL STENOSIS   . PLANTAR FASCIITIS   . NONSPECIFIC ABNORMAL ELECTROCARDIOGRAM 09/15/2010    normal stress test 09/2010  . DEPRESSION   . GERD   . DYSLIPIDEMIA   . HYPERTENSION   . Multinodular goiter      Review of Systems  Constitutional: Negative for fatigue.  Respiratory: Negative for shortness of breath.   Musculoskeletal: Positive for back pain and arthralgias. Negative for joint swelling and gait problem.       Objective:   Physical Exam  BP 152/72  Pulse 80  Temp(Src) 97.7 F (36.5 C) (Oral)  Ht 5\' 4"  (1.626 m)  Wt 153 lb 1.9 oz (69.455 kg)  BMI 26.28 kg/m2  SpO2 98% Wt Readings from Last 3 Encounters:  01/13/12 153 lb 1.9 oz (69.455 kg)  09/24/11  161 lb 1.9 oz (73.084 kg)  07/16/11 155 lb 4 oz (70.421 kg)   Constitutional: She appears well-developed and well-nourished.  Neck: Normal range of motion. Neck supple. No JVD or LAD present. No thyromegaly present.  Cardiovascular: Normal rate, regular rhythm and normal heart sounds.  No murmur heard. No BLE edema. Pulmonary/Chest: Effort normal and breath sounds normal. No respiratory distress. She has no wheezes. MSkel: Back: full range of motion of thoracic and lumbar spine. Non tender to palpation. Negative straight leg raise. DTR's are symmetrically intact. Sensation intact in all dermatomes of the lower extremities. Full strength to manual muscle testing. patient is able to heel toe walk without difficulty and ambulates with antalgic gait.  Psychiatric: She has a normal mood and affect. Her behavior is normal. Judgment and thought content normal.   Lab Results  Component Value Date   WBC 9.0 09/25/2010   HGB 14.2 09/25/2010   HCT 42.9 09/25/2010   PLT 254 09/25/2010   CHOL 199 07/16/2011   TRIG 103.0 07/16/2011   HDL 57.50 07/16/2011   LDLDIRECT 174.2 01/14/2011   ALT 29 06/23/2009   AST 25 06/23/2009   NA 138 09/25/2010   K 3.9 09/25/2010   CL 106 09/25/2010   CREATININE 0.77 09/25/2010   BUN 19 09/25/2010   CO2 26 09/25/2010   TSH 1.10 08/25/2009  Assessment & Plan:  See problem list. Medications and labs reviewed today.

## 2012-01-14 LAB — LDL CHOLESTEROL, DIRECT: Direct LDL: 130.7 mg/dL

## 2012-01-22 ENCOUNTER — Other Ambulatory Visit: Payer: Self-pay | Admitting: Internal Medicine

## 2012-02-25 ENCOUNTER — Inpatient Hospital Stay: Admission: RE | Admit: 2012-02-25 | Payer: Medicare Other | Source: Ambulatory Visit

## 2012-03-02 ENCOUNTER — Ambulatory Visit (INDEPENDENT_AMBULATORY_CARE_PROVIDER_SITE_OTHER)
Admission: RE | Admit: 2012-03-02 | Discharge: 2012-03-02 | Disposition: A | Discharge status: Home / Self Care | Payer: Medicare Other | Source: Ambulatory Visit

## 2012-03-02 DIAGNOSIS — E785 Hyperlipidemia, unspecified: Secondary | ICD-10-CM

## 2012-03-02 DIAGNOSIS — M949 Disorder of cartilage, unspecified: Secondary | ICD-10-CM

## 2012-03-02 DIAGNOSIS — M549 Dorsalgia, unspecified: Secondary | ICD-10-CM

## 2012-04-19 ENCOUNTER — Other Ambulatory Visit: Payer: Self-pay | Admitting: *Deleted

## 2012-04-19 MED ORDER — TRAMADOL HCL 50 MG PO TABS: ORAL_TABLET | ORAL | Status: DC

## 2012-04-19 NOTE — Telephone Encounter (Signed)
Pt wants rx for Tramadol sent to her pharmacy. She states that pharmacy told her to contact office since her refill had expired. Rx sent to pharmacy, pt informed.

## 2012-06-01 ENCOUNTER — Other Ambulatory Visit: Payer: Self-pay | Admitting: Internal Medicine

## 2012-06-02 ENCOUNTER — Other Ambulatory Visit: Payer: Self-pay | Admitting: Internal Medicine

## 2012-06-14 ENCOUNTER — Other Ambulatory Visit: Payer: Self-pay | Admitting: Rehabilitation

## 2012-06-14 DIAGNOSIS — M5136 Other intervertebral disc degeneration, lumbar region: Secondary | ICD-10-CM

## 2012-06-21 ENCOUNTER — Ambulatory Visit
Admission: RE | Admit: 2012-06-21 | Discharge: 2012-06-21 | Disposition: A | Discharge status: Home / Self Care | Payer: Medicare Other | Source: Ambulatory Visit | Attending: Rehabilitation | Admitting: Rehabilitation

## 2012-06-21 DIAGNOSIS — M5136 Other intervertebral disc degeneration, lumbar region: Secondary | ICD-10-CM

## 2012-07-06 ENCOUNTER — Other Ambulatory Visit: Payer: Self-pay | Admitting: Dermatology

## 2012-07-19 ENCOUNTER — Ambulatory Visit: Payer: Medicare Other | Admitting: Internal Medicine

## 2012-07-21 ENCOUNTER — Ambulatory Visit (INDEPENDENT_AMBULATORY_CARE_PROVIDER_SITE_OTHER): Payer: Medicare Other | Admitting: Internal Medicine

## 2012-07-21 ENCOUNTER — Encounter: Payer: Self-pay | Admitting: Internal Medicine

## 2012-07-21 VITALS — BP 128/80 | HR 89 | Temp 98.3°F | Ht 64.0 in | Wt 152.1 lb

## 2012-07-21 DIAGNOSIS — I10 Essential (primary) hypertension: Secondary | ICD-10-CM

## 2012-07-21 DIAGNOSIS — F329 Major depressive disorder, single episode, unspecified: Secondary | ICD-10-CM

## 2012-07-21 DIAGNOSIS — Z23 Encounter for immunization: Secondary | ICD-10-CM

## 2012-07-21 DIAGNOSIS — Z1331 Encounter for screening for depression: Secondary | ICD-10-CM

## 2012-07-21 DIAGNOSIS — E785 Hyperlipidemia, unspecified: Secondary | ICD-10-CM

## 2012-07-21 DIAGNOSIS — M4800 Spinal stenosis, site unspecified: Secondary | ICD-10-CM

## 2012-07-21 MED ORDER — OMEPRAZOLE 20 MG PO CPDR: 20.0000 mg | DELAYED_RELEASE_CAPSULE | Freq: Every day | ORAL | Status: DC

## 2012-07-21 MED ORDER — TRAZODONE HCL 50 MG PO TABS: 50.0000 mg | ORAL_TABLET | Freq: Every day | ORAL | Status: DC

## 2012-07-21 NOTE — Progress Notes (Signed)
  Subjective:    Patient ID: [name], female    DOB: 08/26/1940, 71 y.o.   MRN: [id number]  HPI   here for follow up - reviewed chronic medical issues:   chronic low back pain -following with [facility] and physical therapy for mgmt of same, 3 ESI summer 2013 - occasional tramadol use if severe pain -   dyslipidemia - on statin + niacin, changed from Mevacor to generic Lipitor due to poor control 12/2010 - reports variable compliance with medical treatment because of myalgia side effects related to current therapy.   migraines - reports compliance with ongoing medical treatment and no changes in medication dose or frequency. denies adverse side effects related to current therapy -[facility] of symptoms and trigger point injections   depression history - previously on lexapro 09/2009, changed to generic citalopram 11/2009 due to cost of lexapro, increase dose 02/2010 due to "nervous" spells, then independently stopped same when she felt improved- reports doing well   Past Medical History  Diagnosis Date  . MIGRAINE HEADACHE   . DIVERTICULOSIS, COLON   . SPINAL STENOSIS   . PLANTAR FASCIITIS   . NONSPECIFIC ABNORMAL ELECTROCARDIOGRAM 09/15/2010    normal stress test 09/2010  . DEPRESSION   . GERD   . DYSLIPIDEMIA   . HYPERTENSION   . Multinodular goiter      Review of Systems  Constitutional: Negative for fatigue and unexpected weight change.  Respiratory: Negative for cough and shortness of breath.   Cardiovascular: Negative for chest pain and leg swelling.  Musculoskeletal: Positive for back pain and arthralgias. Negative for joint swelling and gait problem.       Objective:   Physical Exam  BP 128/80  Pulse 89  Temp 98.3 F (36.8 C) (Oral)  Ht 5\' 4"  (1.626 m)  Wt 152 lb 1.9 oz (69.001 kg)  BMI 26.11 kg/m2  SpO2 98% Wt Readings from Last 3 Encounters:  07/21/12 152 lb 1.9 oz (69.001 kg)  01/13/12 153 lb 1.9 oz (69.455 kg)   09/24/11 161 lb 1.9 oz (73.084 kg)   Constitutional: She appears well-developed and well-nourished.  Neck: Normal range of motion. Neck supple. No JVD or LAD present. No thyromegaly present.  Cardiovascular: Normal rate, regular rhythm and normal heart sounds.  No murmur heard. No BLE edema. Pulmonary/Chest: Effort normal and breath sounds normal. No respiratory distress. She has no wheezes. Psychiatric: She has a normal mood and affect. Her behavior is normal. Judgment and thought content normal.   Lab Results  Component Value Date   WBC 9.0 09/25/2010   HGB 14.2 09/25/2010   HCT 42.9 09/25/2010   PLT 254 09/25/2010   CHOL 216* 01/13/2012   TRIG 232.0* 01/13/2012   HDL 49.30 01/13/2012   LDLDIRECT 130.7 01/13/2012   ALT 29 06/23/2009   AST 25 06/23/2009   NA 138 09/25/2010   K 3.9 09/25/2010   CL 106 09/25/2010   CREATININE 0.77 09/25/2010   BUN 19 09/25/2010   CO2 26 09/25/2010   TSH 1.10 08/25/2009       Assessment & Plan:  See problem list. Medications and labs reviewed today.

## 2012-07-21 NOTE — Assessment & Plan Note (Signed)
On statin intermittent due to myalgias + niacin recheck lipids today; adjust medications as needed

## 2012-07-21 NOTE — Assessment & Plan Note (Signed)
symptoms stable at this time Overlap with anxiety, not currently on SSRI/SNRI Refill traz

## 2012-07-21 NOTE — Patient Instructions (Signed)
It was good to see you today. We have reviewed your prior records including labs and tests today Medications reviewed, no changes at this time. Refill on medication(s) as discussed today. Please schedule followup in 6 months for blood pressure and cholesterol check, call sooner if problems.

## 2012-07-21 NOTE — Assessment & Plan Note (Signed)
Previously followed with dr. Regino Schultze and PT for same -  Working with Dr Retia Passe since summer 2013, s/p 3 ESI Continue prn muscle relaxer, continue tramadol

## 2012-07-21 NOTE — Assessment & Plan Note (Signed)
BP Readings from Last 3 Encounters:  07/21/12 128/80  01/13/12 152/72  09/24/11 142/78   The current medical regimen is effective;  continue present plan and medications.

## 2012-09-06 ENCOUNTER — Other Ambulatory Visit: Payer: Self-pay | Admitting: Internal Medicine

## 2012-09-06 DIAGNOSIS — Z1231 Encounter for screening mammogram for malignant neoplasm of breast: Secondary | ICD-10-CM

## 2012-09-25 ENCOUNTER — Other Ambulatory Visit: Payer: Self-pay | Admitting: Internal Medicine

## 2012-10-05 ENCOUNTER — Ambulatory Visit
Admission: RE | Admit: 2012-10-05 | Discharge: 2012-10-05 | Disposition: A | Discharge status: Home / Self Care | Payer: Medicare Other | Source: Ambulatory Visit | Attending: Internal Medicine | Admitting: Internal Medicine

## 2012-10-05 DIAGNOSIS — Z1231 Encounter for screening mammogram for malignant neoplasm of breast: Secondary | ICD-10-CM

## 2012-11-16 ENCOUNTER — Other Ambulatory Visit: Payer: Self-pay | Admitting: Internal Medicine

## 2013-01-09 ENCOUNTER — Other Ambulatory Visit: Payer: Self-pay | Admitting: Internal Medicine

## 2013-01-30 ENCOUNTER — Other Ambulatory Visit: Payer: Self-pay | Admitting: Internal Medicine

## 2013-02-06 ENCOUNTER — Other Ambulatory Visit (INDEPENDENT_AMBULATORY_CARE_PROVIDER_SITE_OTHER): Payer: Medicare Other

## 2013-02-06 ENCOUNTER — Ambulatory Visit (INDEPENDENT_AMBULATORY_CARE_PROVIDER_SITE_OTHER): Payer: Medicare Other | Admitting: Internal Medicine

## 2013-02-06 ENCOUNTER — Encounter: Payer: Self-pay | Admitting: Internal Medicine

## 2013-02-06 VITALS — BP 152/80 | HR 50 | Temp 97.8°F | Wt 143.0 lb

## 2013-02-06 DIAGNOSIS — I10 Essential (primary) hypertension: Secondary | ICD-10-CM

## 2013-02-06 DIAGNOSIS — E785 Hyperlipidemia, unspecified: Secondary | ICD-10-CM

## 2013-02-06 DIAGNOSIS — R634 Abnormal weight loss: Secondary | ICD-10-CM

## 2013-02-06 LAB — CBC WITH DIFFERENTIAL/PLATELET
Basophils Absolute: 0 10*3/uL (ref 0.0–0.1)
Basophils Relative: 0.3 % (ref 0.0–3.0)
Eosinophils Absolute: 0.3 10*3/uL (ref 0.0–0.7)
Hemoglobin: 15.1 g/dL — ABNORMAL HIGH (ref 12.0–15.0)
Lymphocytes Relative: 18.8 % (ref 12.0–46.0)
MCHC: 33.5 g/dL (ref 30.0–36.0)
Monocytes Relative: 6 % (ref 3.0–12.0)
Neutro Abs: 7.9 10*3/uL — ABNORMAL HIGH (ref 1.4–7.7)
Neutrophils Relative %: 72 % (ref 43.0–77.0)
RBC: 5.03 Mil/uL (ref 3.87–5.11)

## 2013-02-06 LAB — HEPATIC FUNCTION PANEL
ALT: 27 U/L (ref 0–35)
Albumin: 4.4 g/dL (ref 3.5–5.2)
Total Protein: 7.9 g/dL (ref 6.0–8.3)

## 2013-02-06 LAB — TSH: TSH: 1.33 u[IU]/mL (ref 0.35–5.50)

## 2013-02-06 LAB — BASIC METABOLIC PANEL
BUN: 23 mg/dL (ref 6–23)
CO2: 26 mEq/L (ref 19–32)
Calcium: 10.3 mg/dL (ref 8.4–10.5)
Chloride: 105 mEq/L (ref 96–112)
Creatinine, Ser: 0.9 mg/dL (ref 0.4–1.2)
Glucose, Bld: 109 mg/dL — ABNORMAL HIGH (ref 70–99)

## 2013-02-06 LAB — LIPID PANEL
Cholesterol: 231 mg/dL — ABNORMAL HIGH (ref 0–200)
Triglycerides: 177 mg/dL — ABNORMAL HIGH (ref 0.0–149.0)

## 2013-02-06 MED ORDER — LOSARTAN POTASSIUM 50 MG PO TABS: 50.0000 mg | ORAL_TABLET | Freq: Every day | ORAL | Status: DC

## 2013-02-06 NOTE — Patient Instructions (Signed)
It was good to see you today. We have reviewed your prior records including labs and tests today Test(s) ordered today. Your results will be released to MyChart (or called to you) after review, usually within 72hours after test completion. If any changes need to be made, you will be notified at that same time. Medications reviewed and updated - begin losartan once daily for blood pressure control, use Tylenol with tramadol as needed for pain - no other changes recommended Your prescription(s) have been submitted to your pharmacy. Please take as directed and contact our office if you believe you are having problem(s) with the medication(s). Please schedule followup in 6 months, call sooner if problems.

## 2013-02-06 NOTE — Assessment & Plan Note (Signed)
BP Readings from Last 3 Encounters:  02/06/13 152/80  07/21/12 128/80  01/13/12 152/72   The current medical regimen is not effective Add losartan 50 mg qd - titrated as needed - erx done

## 2013-02-06 NOTE — Progress Notes (Signed)
  Subjective:    Patient ID: [name], female    DOB: [birth year], 72 y.o.   MRN: [id number]  HPI   here for follow up - reviewed chronic medical issues:   chronic low back pain -following with [facility] and physical therapy for mgmt of same, 3 ESI summer 2013 and spring 2014 - occasional tramadol use if severe pain -   dyslipidemia - on statin + niacin, changed from Mevacor to generic Lipitor due to poor control 12/2010 - reports variable compliance with medical treatment because of myalgia side effects related to current therapy.   migraines - reports compliance with ongoing medical treatment and no changes in medication dose or frequency. denies adverse side effects related to current therapy -[facility] of symptoms and trigger point injections   depression history - previously on lexapro 09/2009, changed to generic citalopram 11/2009 due to cost of lexapro, increase dose 02/2010 due to "nervous" spells, then independently stopped same when she felt improved- reports doing well, but "stressed" with spouse illness (CVA early 2014)  Past Medical History  Diagnosis Date  . MIGRAINE HEADACHE   . DIVERTICULOSIS, COLON   . SPINAL STENOSIS   . PLANTAR FASCIITIS   . NONSPECIFIC ABNORMAL ELECTROCARDIOGRAM 09/15/2010    normal stress test 09/2010  . DEPRESSION   . GERD   . DYSLIPIDEMIA   . HYPERTENSION   . Multinodular goiter      Review of Systems  Constitutional: Positive for unexpected weight change. Negative for fatigue.  Respiratory: Negative for cough and shortness of breath.   Cardiovascular: Negative for chest pain and leg swelling.  Musculoskeletal: Positive for back pain and arthralgias. Negative for joint swelling and gait problem.       Objective:   Physical Exam  BP 152/80  Pulse 50  Temp(Src) 97.8 F (36.6 C) (Oral)  Wt 143 lb (64.864 kg)  BMI 24.53 kg/m2  SpO2 96% Wt Readings from Last 3 Encounters:  02/06/13 143 lb  (64.864 kg)  07/21/12 152 lb 1.9 oz (69.001 kg)  01/13/12 153 lb 1.9 oz (69.455 kg)   Constitutional: She appears well-developed and well-nourished.  Neck: Normal range of motion. Neck supple. No JVD or LAD present. No thyromegaly present.  Cardiovascular: Normal rate, regular rhythm and normal heart sounds.  No murmur heard. No BLE edema. Pulmonary/Chest: Effort normal and breath sounds normal. No respiratory distress. She has no wheezes. Psychiatric: She has a normal mood and affect. Her behavior is normal. Judgment and thought content normal.   Lab Results  Component Value Date   WBC 9.0 09/25/2010   HGB 14.2 09/25/2010   HCT 42.9 09/25/2010   PLT 254 09/25/2010   CHOL 216* 01/13/2012   TRIG 232.0* 01/13/2012   HDL 49.30 01/13/2012   LDLDIRECT 130.7 01/13/2012   ALT 29 06/23/2009   AST 25 06/23/2009   NA 138 09/25/2010   K 3.9 09/25/2010   CL 106 09/25/2010   CREATININE 0.77 09/25/2010   BUN 19 09/25/2010   CO2 26 09/25/2010   TSH 1.10 08/25/2009       Assessment & Plan:  See problem list. Medications and labs reviewed today.

## 2013-02-06 NOTE — Assessment & Plan Note (Signed)
Unintended loss almost 10# in last 6 mo - ?stress related with spouse's illness/CVA early 2014 ?topamax side effects - but no recent dose changes reported Check screening labs now

## 2013-02-06 NOTE — Assessment & Plan Note (Signed)
On statin intermittent due to myalgias + niacin recheck lipids today; adjust medications as needed

## 2013-02-09 ENCOUNTER — Other Ambulatory Visit: Payer: Self-pay | Admitting: Internal Medicine

## 2013-04-15 ENCOUNTER — Other Ambulatory Visit: Payer: Self-pay | Admitting: Internal Medicine

## 2013-05-04 ENCOUNTER — Other Ambulatory Visit: Payer: Self-pay | Admitting: Internal Medicine

## 2013-05-07 ENCOUNTER — Other Ambulatory Visit: Payer: Self-pay | Admitting: Internal Medicine

## 2013-05-07 NOTE — Telephone Encounter (Signed)
[facility].../[facility]mb 

## 2013-07-05 HISTORY — PX: CATARACT EXTRACTION: SUR2

## 2013-07-25 ENCOUNTER — Other Ambulatory Visit: Payer: Self-pay | Admitting: Internal Medicine

## 2013-07-26 MED ORDER — TRAZODONE HCL 50 MG PO TABS: ORAL_TABLET | ORAL | Status: DC

## 2013-07-26 NOTE — Telephone Encounter (Signed)
VAL pt  

## 2013-07-26 NOTE — Addendum Note (Signed)
Addended by: Deatra James on: 07/26/2013 09:36 AM   Modules accepted: Orders

## 2013-07-26 NOTE — Telephone Encounter (Signed)
[facility] gave md approval. Spoke with Marchelle Folks...Raechel Chute

## 2013-07-31 ENCOUNTER — Ambulatory Visit (INDEPENDENT_AMBULATORY_CARE_PROVIDER_SITE_OTHER): Payer: Medicare Other | Admitting: Internal Medicine

## 2013-07-31 ENCOUNTER — Encounter: Payer: Self-pay | Admitting: Internal Medicine

## 2013-07-31 VITALS — BP 120/80 | HR 84 | Temp 98.0°F | Resp 20 | Ht 64.0 in | Wt 149.0 lb

## 2013-07-31 DIAGNOSIS — Z23 Encounter for immunization: Secondary | ICD-10-CM

## 2013-07-31 DIAGNOSIS — R9431 Abnormal electrocardiogram [ECG] [EKG]: Secondary | ICD-10-CM

## 2013-07-31 DIAGNOSIS — Z2911 Encounter for prophylactic immunotherapy for respiratory syncytial virus (RSV): Secondary | ICD-10-CM

## 2013-07-31 DIAGNOSIS — G43909 Migraine, unspecified, not intractable, without status migrainosus: Secondary | ICD-10-CM

## 2013-07-31 DIAGNOSIS — Z0000 Encounter for general adult medical examination without abnormal findings: Secondary | ICD-10-CM

## 2013-07-31 NOTE — Assessment & Plan Note (Addendum)
Reports told during recent cataract surgery 06/2013 that she has "irregular" heartbeat - no copy available today Check ECG today: freq PVCs, no other arrythmia Prior stress test 09/2010 unremarkable and no anginal or palpitations symptoms

## 2013-07-31 NOTE — Patient Instructions (Addendum)
It was good to see you today.  We have reviewed your prior records including labs and tests today  ECG today looks ok   Medications reviewed and updated, no changes recommended at this time. We will be happy to refill your Topamax and Imitrex as needed here instead of going to Dr. Neale Burly  Flu shot and shingles vaccines updated today  Please schedule followup in 12 months, call sooner if problems.  Health Maintenance, Female A healthy lifestyle and preventative care can promote health and wellness.  Maintain regular health, dental, and eye exams.  Eat a healthy diet. Foods like vegetables, fruits, whole grains, low-fat dairy products, and lean protein foods contain the nutrients you need without too many calories. Decrease your intake of foods high in solid fats, added sugars, and salt. Get information about a proper diet from your caregiver, if necessary.  Regular physical exercise is one of the most important things you can do for your health. Most adults should get at least 150 minutes of moderate-intensity exercise (any activity that increases your heart rate and causes you to sweat) each week. In addition, most adults need muscle-strengthening exercises on 2 or more days a week.   Maintain a healthy weight. The body mass index (BMI) is a screening tool to identify possible weight problems. It provides an estimate of body fat based on height and weight. Your caregiver can help determine your BMI, and can help you achieve or maintain a healthy weight. For adults 20 years and older:  A BMI below 18.5 is considered underweight.  A BMI of 18.5 to 24.9 is normal.  A BMI of 25 to 29.9 is considered overweight.  A BMI of 30 and above is considered obese.  Maintain normal blood lipids and cholesterol by exercising and minimizing your intake of saturated fat. Eat a balanced diet with plenty of fruits and vegetables. Blood tests for lipids and cholesterol should begin at age 70 and be  repeated every 5 years. If your lipid or cholesterol levels are high, you are over 50, or you are a high risk for heart disease, you may need your cholesterol levels checked more frequently.Ongoing high lipid and cholesterol levels should be treated with medicines if diet and exercise are not effective.  If you smoke, find out from your caregiver how to quit. If you do not use tobacco, do not start.  Lung cancer screening is recommended for adults aged 56 80 years who are at high risk for developing lung cancer because of a history of smoking. Yearly low-dose computed tomography (CT) is recommended for people who have at least a 30-pack-year history of smoking and are a current smoker or have quit within the past 15 years. A pack year of smoking is smoking an average of 1 pack of cigarettes a day for 1 year (for example: 1 pack a day for 30 years or 2 packs a day for 15 years). Yearly screening should continue until the smoker has stopped smoking for at least 15 years. Yearly screening should also be stopped for people who develop a health problem that would prevent them from having lung cancer treatment.  If you are pregnant, do not drink alcohol. If you are breastfeeding, be very cautious about drinking alcohol. If you are not pregnant and choose to drink alcohol, do not exceed 1 drink per day. One drink is considered to be 12 ounces (355 mL) of beer, 5 ounces (148 mL) of wine, or 1.5 ounces (44 mL) of liquor.  Avoid use of street drugs. Do not share needles with anyone. Ask for help if you need support or instructions about stopping the use of drugs.  High blood pressure causes heart disease and increases the risk of stroke. Blood pressure should be checked at least every 1 to 2 years. Ongoing high blood pressure should be treated with medicines, if weight loss and exercise are not effective.  If you are 44 to 71 years old, ask your caregiver if you should take aspirin to prevent strokes.  Diabetes  screening involves taking a blood sample to check your fasting blood sugar level. This should be done once every 3 years, after age 54, if you are within normal weight and without risk factors for diabetes. Testing should be considered at a younger age or be carried out more frequently if you are overweight and have at least 1 risk factor for diabetes.  Breast cancer screening is essential preventative care for women. You should practice "breast self-awareness." This means understanding the normal appearance and feel of your breasts and may include breast self-examination. Any changes detected, no matter how small, should be reported to a caregiver. Women in their 31s and 30s should have a clinical breast exam (CBE) by a caregiver as part of a regular health exam every 1 to 3 years. After age 12, women should have a CBE every year. Starting at age 70, women should consider having a mammogram (breast X-ray) every year. Women who have a family history of breast cancer should talk to their caregiver about genetic screening. Women at a high risk of breast cancer should talk to their caregiver about having an MRI and a mammogram every year.  Breast cancer gene (BRCA)-related cancer risk assessment is recommended for women who have family members with BRCA-related cancers. BRCA-related cancers include breast, ovarian, tubal, and peritoneal cancers. Having family members with these cancers may be associated with an increased risk for harmful changes (mutations) in the breast cancer genes BRCA1 and BRCA2. Results of the assessment will determine the need for genetic counseling and BRCA1 and BRCA2 testing.  The Pap test is a screening test for cervical cancer. Women should have a Pap test starting at age 35. Between ages 63 and 55, Pap tests should be repeated every 2 years. Beginning at age 24, you should have a Pap test every 3 years as long as the past 3 Pap tests have been normal. If you had a hysterectomy for a  problem that was not cancer or a condition that could lead to cancer, then you no longer need Pap tests. If you are between ages 62 and 39, and you have had normal Pap tests going back 10 years, you no longer need Pap tests. If you have had past treatment for cervical cancer or a condition that could lead to cancer, you need Pap tests and screening for cancer for at least 20 years after your treatment. If Pap tests have been discontinued, risk factors (such as a new sexual partner) need to be reassessed to determine if screening should be resumed. Some women have medical problems that increase the chance of getting cervical cancer. In these cases, your caregiver may recommend more frequent screening and Pap tests.  The human papillomavirus (HPV) test is an additional test that may be used for cervical cancer screening. The HPV test looks for the virus that can cause the cell changes on the cervix. The cells collected during the Pap test can be tested for HPV. The  HPV test could be used to screen women aged 10 years and older, and should be used in women of any age who have unclear Pap test results. After the age of 93, women should have HPV testing at the same frequency as a Pap test.  Colorectal cancer can be detected and often prevented. Most routine colorectal cancer screening begins at the age of 61 and continues through age 67. However, your caregiver may recommend screening at an earlier age if you have risk factors for colon cancer. On a yearly basis, your caregiver may provide home test kits to check for hidden blood in the stool. Use of a small camera at the end of a tube, to directly examine the colon (sigmoidoscopy or colonoscopy), can detect the earliest forms of colorectal cancer. Talk to your caregiver about this at age 51, when routine screening begins. Direct examination of the colon should be repeated every 5 to 10 years through age 78, unless early forms of pre-cancerous polyps or small growths  are found.  Hepatitis C blood testing is recommended for all people born from [birth year] through 1965 and any individual with known risks for hepatitis C.  Practice safe sex. Use condoms and avoid high-risk sexual practices to reduce the spread of sexually transmitted infections (STIs). Sexually active women aged 79 and younger should be checked for Chlamydia, which is a common sexually transmitted infection. Older women with new or multiple partners should also be tested for Chlamydia. Testing for other STIs is recommended if you are sexually active and at increased risk.  Osteoporosis is a disease in which the bones lose minerals and strength with aging. This can result in serious bone fractures. The risk of osteoporosis can be identified using a bone density scan. Women ages 38 and over and women at risk for fractures or osteoporosis should discuss screening with their caregivers. Ask your caregiver whether you should be taking a calcium supplement or vitamin D to reduce the rate of osteoporosis.  Menopause can be associated with physical symptoms and risks. Hormone replacement therapy is available to decrease symptoms and risks. You should talk to your caregiver about whether hormone replacement therapy is right for you.  Use sunscreen. Apply sunscreen liberally and repeatedly throughout the day. You should seek shade when your shadow is shorter than you. Protect yourself by wearing long sleeves, pants, a wide-brimmed hat, and sunglasses year round, whenever you are outdoors.  Notify your caregiver of new moles or changes in moles, especially if there is a change in shape or color. Also notify your caregiver if a mole is larger than the size of a pencil eraser.  Stay current with your immunizations. Document Released: 02/15/2011 Document Revised: 11/27/2012 Document Reviewed: 02/15/2011 [facility] Patient Information 2014 [place], [place].

## 2013-07-31 NOTE — Progress Notes (Signed)
Subjective:    Patient ID: [name], female    DOB: January 29, 1941, 72 y.o.   MRN: [id number]  HPI  Here for medicare wellness  Diet: heart healthy Physical activity: sedentary Depression/mood screen: negative Hearing: intact to whispered voice Visual acuity: grossly normal, performs annual eye exam  ADLs: capable Fall risk: none Home safety: good Cognitive evaluation: intact to orientation, naming, recall and repetition EOL planning: adv directives, full code/ I agree  I have personally reviewed and have noted 1. The patient's medical and social history 2. Their use of alcohol, tobacco or illicit drugs 3. Their current medications and supplements 4. The patient's functional ability including ADL's, fall risks, home safety risks and hearing or visual impairment. 5. Diet and physical activities 6. Evidence for depression or mood disorders   Also reviewed chronic medical issues and interval medical events:   chronic low back pain -following with [facility] and physical therapy for mgmt of same, 3 ESI summer 2013, spring 2014, late 2014 - occasional tramadol use if severe pain -   dyslipidemia - on statin + niacin, changed from Mevacor to generic Lipitor due to poor control 12/2010 - reports variable compliance with medical treatment because of myalgia side effects related to current therapy.   Migraines - reports compliance with ongoing medical treatment and no changes in medication dose or frequency. denies adverse side effects related to current therapy -[facility] symptoms and trigger point injections   depression history - previously on lexapro 09/2009, changed to generic citalopram 11/2009 due to cost of lexapro, increase dose 02/2010 due to "nervous" spells, then independently stopped same when she felt improved- reports doing well, but "stressed" with spouse illness (CVA early 2014)  Past Medical History  Diagnosis  Date  . MIGRAINE HEADACHE   . DIVERTICULOSIS, COLON   . SPINAL STENOSIS   . PLANTAR FASCIITIS   . NONSPECIFIC ABNORMAL ELECTROCARDIOGRAM 09/15/2010    normal stress test 09/2010  . DEPRESSION   . GERD   . DYSLIPIDEMIA   . HYPERTENSION   . Multinodular goiter    Family History  Problem Relation Age of Onset  . Arthritis Mother   . Arthritis Father   . Hyperlipidemia Other     parents  . Heart disease Other     parent, grandparents   History  Substance Use Topics  . Smoking status: Never Smoker   . Smokeless tobacco: Not on file     Comment: Married, lives with spouse and 2 sons involved with social activites with spouse at [employer] and church  . Alcohol Use: No     Review of Systems  Constitutional: Positive for unexpected weight change. Negative for fatigue.  Respiratory: Negative for cough and shortness of breath.   Cardiovascular: Negative for chest pain, palpitations and leg swelling.  Musculoskeletal: Positive for arthralgias and back pain. Negative for gait problem and joint swelling.  All other systems reviewed and are negative.       Objective:   Physical Exam BP 120/80  Pulse 84  Temp(Src) 98 F (36.7 C) (Oral)  Resp 20  Ht 5\' 4"  (1.626 m)  Wt 149 lb (67.586 kg)  BMI 25.56 kg/m2  SpO2 98% Wt Readings from Last 3 Encounters:  07/31/13 149 lb (67.586 kg)  02/06/13 143 lb (64.864 kg)  07/21/12 152 lb 1.9 oz (69.001 kg)   Constitutional: She appears well-developed and well-nourished.  Neck: Normal range of motion. Neck supple.  No JVD or LAD present. No thyromegaly present.  Cardiovascular: Normal rate, regular rhythm and normal heart sounds.  No murmur heard. No BLE edema. Pulmonary/Chest: Effort normal and breath sounds normal. No respiratory distress. She has no wheezes. Psychiatric: She has a normal mood and affect. Her behavior is normal. Judgment and thought content normal.   Lab Results  Component Value Date   WBC 10.9* 02/06/2013   HGB  15.1* 02/06/2013   HCT 45.1 02/06/2013   PLT 346.0 02/06/2013   CHOL 231* 02/06/2013   TRIG 177.0* 02/06/2013   HDL 50.30 02/06/2013   LDLDIRECT 156.8 02/06/2013   ALT 27 02/06/2013   AST 20 02/06/2013   NA 139 02/06/2013   K 4.3 02/06/2013   CL 105 02/06/2013   CREATININE 0.9 02/06/2013   BUN 23 02/06/2013   CO2 26 02/06/2013   TSH 1.33 02/06/2013      ECG: sinus @ 78 bpm - freq PVCs  Assessment & Plan:    CPX/AWV/v70.0 - Today patient counseled on age appropriate routine health concerns for screening and prevention, each reviewed and up to date or declined. Immunizations reviewed and up to date or declined. Labs reviewed. Risk factors for depression reviewed and negative. Hearing function and visual acuity are intact. ADLs screened and addressed as needed. Functional ability and level of safety reviewed and appropriate. Education, counseling and referrals performed based on assessed risks today. Patient provided with a copy of personalized plan for preventive services.  Also see problem list. Medications and labs reviewed today.

## 2013-07-31 NOTE — Progress Notes (Signed)
Pre-visit discussion using our clinic review tool. No additional management support is needed unless otherwise documented below in the visit note.  

## 2013-07-31 NOTE — Assessment & Plan Note (Signed)
Has followed with neuro but stable symptoms >12 mo on current meds (topamax + imitrex prn) Will refill here and no longer follow with neuro so long as symptoms stable

## 2013-09-05 ENCOUNTER — Other Ambulatory Visit: Payer: Self-pay

## 2013-09-05 DIAGNOSIS — Z1231 Encounter for screening mammogram for malignant neoplasm of breast: Secondary | ICD-10-CM

## 2013-10-08 ENCOUNTER — Ambulatory Visit
Admission: RE | Admit: 2013-10-08 | Discharge: 2013-10-08 | Disposition: A | Discharge status: Home / Self Care | Payer: Medicare Other | Source: Ambulatory Visit

## 2013-10-08 DIAGNOSIS — Z1231 Encounter for screening mammogram for malignant neoplasm of breast: Secondary | ICD-10-CM

## 2013-11-06 ENCOUNTER — Other Ambulatory Visit: Payer: Self-pay | Admitting: Internal Medicine

## 2014-01-16 ENCOUNTER — Other Ambulatory Visit: Payer: Self-pay | Admitting: Internal Medicine

## 2014-01-16 MED ORDER — TRAMADOL HCL 50 MG PO TABS: 50.0000 mg | ORAL_TABLET | Freq: Four times a day (QID) | ORAL | Status: DC | PRN

## 2014-01-16 NOTE — Telephone Encounter (Signed)
[facility].../mb

## 2014-02-05 ENCOUNTER — Other Ambulatory Visit: Payer: Self-pay | Admitting: Internal Medicine

## 2014-04-09 ENCOUNTER — Other Ambulatory Visit: Payer: Self-pay | Admitting: Internal Medicine

## 2014-04-25 ENCOUNTER — Other Ambulatory Visit: Payer: Self-pay | Admitting: Internal Medicine

## 2014-04-26 ENCOUNTER — Other Ambulatory Visit: Payer: Self-pay

## 2014-04-26 MED ORDER — SUMATRIPTAN SUCCINATE 100 MG PO TABS: 100.0000 mg | ORAL_TABLET | ORAL | Status: DC | PRN

## 2014-06-03 ENCOUNTER — Ambulatory Visit (INDEPENDENT_AMBULATORY_CARE_PROVIDER_SITE_OTHER): Payer: Medicare Other | Admitting: Internal Medicine

## 2014-06-03 ENCOUNTER — Encounter: Payer: Self-pay | Admitting: Internal Medicine

## 2014-06-03 VITALS — BP 118/74 | HR 62 | Temp 98.1°F | Resp 16 | Ht 64.0 in | Wt 164.5 lb

## 2014-06-03 DIAGNOSIS — J202 Acute bronchitis due to streptococcus: Secondary | ICD-10-CM | POA: Insufficient documentation

## 2014-06-03 MED ORDER — PROMETHAZINE-DM 6.25-15 MG/5ML PO SYRP: 5.0000 mL | ORAL_SOLUTION | Freq: Four times a day (QID) | ORAL | Status: DC | PRN

## 2014-06-03 MED ORDER — AZITHROMYCIN 500 MG PO TABS: 500.0000 mg | ORAL_TABLET | Freq: Every day | ORAL | Status: DC

## 2014-06-03 NOTE — Assessment & Plan Note (Signed)
Will treat the infection with a zpak and will control the cough with phenergan-dm

## 2014-06-03 NOTE — Progress Notes (Signed)
   Subjective:    Patient ID: [name], female    DOB: [birth year], 73 y.o.   MRN: [id number]  Cough This is a new problem. The current episode started in the past 7 days. The problem has been unchanged. The problem occurs every few hours. The cough is productive of purulent sputum. Associated symptoms include chills, postnasal drip, rhinorrhea and a sore throat. Pertinent negatives include no chest pain, ear congestion, ear pain, fever, headaches, heartburn, hemoptysis, myalgias, nasal congestion, rash, shortness of breath, sweats, weight loss or wheezing. Nothing aggravates the symptoms. She has tried nothing for the symptoms. The treatment provided no relief. There is no history of asthma, bronchiectasis, bronchitis, COPD, emphysema, environmental allergies or pneumonia.      Review of Systems  Constitutional: Positive for chills. Negative for fever, weight loss, diaphoresis, activity change, appetite change and fatigue.  HENT: Positive for postnasal drip, rhinorrhea and sore throat. Negative for ear pain, trouble swallowing and voice change.   Eyes: Negative.   Respiratory: Positive for cough. Negative for apnea, hemoptysis, choking, chest tightness, shortness of breath, wheezing and stridor.   Cardiovascular: Negative.  Negative for chest pain, palpitations and leg swelling.  Gastrointestinal: Negative.  Negative for heartburn and abdominal pain.  Endocrine: Negative.   Genitourinary: Negative.   Musculoskeletal: Negative.  Negative for myalgias.  Skin: Negative.  Negative for rash.  Allergic/Immunologic: Negative.  Negative for environmental allergies.  Neurological: Negative.  Negative for headaches.  Hematological: Negative.  Negative for adenopathy. Does not bruise/bleed easily.  Psychiatric/Behavioral: Negative.   All other systems reviewed and are negative.      Objective:   Physical Exam  Vitals reviewed. Constitutional: She is oriented to person, place, and time. She  appears well-developed and well-nourished.  Non-toxic appearance. She does not have a sickly appearance. She does not appear ill. No distress.  HENT:  Head: Normocephalic and atraumatic.  Mouth/Throat: Oropharynx is clear and moist. No oropharyngeal exudate.  Eyes: Conjunctivae are normal. Right eye exhibits no discharge. Left eye exhibits no discharge. No scleral icterus.  Neck: Normal range of motion. Neck supple. No JVD present. No tracheal deviation present. No thyromegaly present.  Cardiovascular: Normal rate, regular rhythm, normal heart sounds and intact distal pulses.  Exam reveals no gallop and no friction rub.   No murmur heard. Pulmonary/Chest: Effort normal and breath sounds normal. No stridor. No respiratory distress. She has no wheezes. She has no rales. She exhibits no tenderness.  Abdominal: Soft. Bowel sounds are normal. She exhibits no distension and no mass. There is no tenderness. There is no rebound and no guarding.  Musculoskeletal: Normal range of motion. She exhibits no edema and no tenderness.  Lymphadenopathy:    She has no cervical adenopathy.  Neurological: She is oriented to person, place, and time.  Skin: Skin is warm and dry. No rash noted. She is not diaphoretic. No erythema. No pallor.          Assessment & Plan:

## 2014-06-03 NOTE — Patient Instructions (Signed)

## 2014-06-17 ENCOUNTER — Other Ambulatory Visit: Payer: Self-pay | Admitting: Internal Medicine

## 2014-06-17 NOTE — Telephone Encounter (Signed)
[facility].../lmb 

## 2014-06-19 ENCOUNTER — Encounter: Payer: Self-pay | Admitting: Cardiology

## 2014-07-02 ENCOUNTER — Other Ambulatory Visit: Payer: Self-pay | Admitting: Internal Medicine

## 2014-07-30 ENCOUNTER — Ambulatory Visit (INDEPENDENT_AMBULATORY_CARE_PROVIDER_SITE_OTHER): Payer: Medicare Other | Admitting: Family

## 2014-07-30 ENCOUNTER — Ambulatory Visit (INDEPENDENT_AMBULATORY_CARE_PROVIDER_SITE_OTHER)
Admission: RE | Admit: 2014-07-30 | Discharge: 2014-07-30 | Disposition: A | Discharge status: Home / Self Care | Payer: Medicare Other | Source: Ambulatory Visit | Attending: Family | Admitting: Family

## 2014-07-30 ENCOUNTER — Encounter: Payer: Self-pay | Admitting: Family

## 2014-07-30 VITALS — BP 130/50 | HR 113 | Temp 97.8°F | Ht 64.0 in | Wt 164.7 lb

## 2014-07-30 DIAGNOSIS — R350 Frequency of micturition: Secondary | ICD-10-CM

## 2014-07-30 DIAGNOSIS — M25562 Pain in left knee: Secondary | ICD-10-CM

## 2014-07-30 LAB — POCT URINALYSIS DIPSTICK
Glucose, UA: NEGATIVE
Nitrite, UA: NEGATIVE
Spec Grav, UA: >=1.03
Urobilinogen, UA: 0.2
pH, UA: 5

## 2014-07-30 NOTE — Progress Notes (Signed)
Pre visit review using our clinic review tool, if applicable. No additional management support is needed unless otherwise documented below in the visit note. 

## 2014-07-30 NOTE — Addendum Note (Signed)
Addended by: Elmer Picker on: 07/30/2014 03:44 PM   Modules accepted: Orders

## 2014-07-30 NOTE — Progress Notes (Signed)
Subjective:    Patient ID: [name], female    DOB: [birth year], 73 y.o.   MRN: [id number]  HPI 73 year old white female, nonsmoker, is in today with c/o left knee pain x 3 weeks that has worsened over the last 3 days. Denies injury. Pain 6/10, worse with standing initially then it gets better after taking a few steps. Has taken Tramadol that helps. No pain with sitting.     Review of Systems  Constitutional: Negative.   Respiratory: Negative.   Cardiovascular: Negative.   Gastrointestinal: Negative.   Endocrine: Negative.   Genitourinary: Negative.   Musculoskeletal: Positive for arthralgias.       Left knee pain   Skin: Negative.   Allergic/Immunologic: Negative.   Psychiatric/Behavioral: Negative.    Past Medical History  Diagnosis Date  . MIGRAINE HEADACHE   . DIVERTICULOSIS, COLON   . SPINAL STENOSIS   . PLANTAR FASCIITIS   . NONSPECIFIC ABNORMAL ELECTROCARDIOGRAM 09/15/2010    normal stress test 09/2010  . DEPRESSION   . GERD   . DYSLIPIDEMIA   . HYPERTENSION   . Multinodular goiter     History   Social History  . Marital Status: Married    Spouse Name: N/A    Number of Children: N/A  . Years of Education: N/A   Occupational History  . Not on file.   Social History Main Topics  . Smoking status: Never Smoker   . Smokeless tobacco: Not on file     Comment: Married, lives with spouse and 2 sons involved with social activites with spouse at [employer] and church  . Alcohol Use: No  . Drug Use: No  . Sexual Activity: Not on file   Other Topics Concern  . Not on file   Social History Narrative    Past Surgical History  Procedure Laterality Date  . Tonsillectomy    . Child birth      x's 2 80 & 99  . Cataract extraction Left 07/05/13    [facility]    Family History  Problem Relation Age of Onset  . Arthritis Mother   . Arthritis Father   . Hyperlipidemia Other     parents  . Heart disease Other     parent, grandparents    No Known  Allergies  Current Outpatient Prescriptions on File Prior to Visit  Medication Sig Dispense Refill  . Calcium Citrate-Vitamin D (CITRACAL PETITES/VITAMIN D) 200-250 MG-UNIT TABS Take by mouth 2 (two) times daily.      . Cholecalciferol (VITAMIN D3) 1000 UNITS CAPS Take by mouth daily.      Marland Kitchen losartan (COZAAR) 50 MG tablet TAKE 1 TABLET BY MOUTH EVERY DAY 90 tablet 0  . lovastatin (MEVACOR) 40 MG tablet TAKE 1 TABLET (40 MG TOTAL) BY MOUTH AT BEDTIME. 90 tablet 2  . Multiple Vitamin (MULTIVITAMIN) tablet Take 1 tablet by mouth daily.      Marland Kitchen omeprazole (PRILOSEC) 20 MG capsule TAKE 1 CAPSULE (20 MG TOTAL) BY MOUTH DAILY. 90 capsule 1  . promethazine-dextromethorphan (PROMETHAZINE-DM) 6.25-15 MG/5ML syrup Take 5 mLs by mouth 4 (four) times daily as needed for cough. 118 mL 0  . SUMAtriptan (IMITREX) 100 MG tablet Take 1 tablet (100 mg total) by mouth every 2 (two) hours as needed. 10 tablet 0  . topiramate (TOPAMAX) 100 MG tablet TAKE 1 TABLET ORALLY DAILY 90 tablet 0  . traMADol (ULTRAM) 50 MG tablet TAKE 1 TABLET EVERY 6 HOURS AS NEEDED FOR  MODERATE TO SEVERE PAIN 180 tablet 5  . traZODone (DESYREL) 50 MG tablet TAKE 1 TABLET BY MOUTH AT BEDTIME 90 tablet 0   No current facility-administered medications on file prior to visit.    BP 130/50 mmHg  Pulse 113  Temp(Src) 97.8 F (36.6 C) (Oral)  Ht 5\' 4"  (1.626 m)  Wt 164 lb 11.2 oz (74.707 kg)  BMI 28.26 kg/m2chart     Objective:   Physical Exam  Constitutional: She is oriented to person, place, and time. She appears well-developed and well-nourished.  Neck: Normal range of motion. Neck supple.  Cardiovascular: Normal rate, regular rhythm and normal heart sounds.   Pulmonary/Chest: Effort normal and breath sounds normal.  Abdominal: Soft. Bowel sounds are normal.  Musculoskeletal: Normal range of motion. She exhibits tenderness. She exhibits no edema.  Tender to palpation medially but no swelling. No pain with flexion and extension.     Neurological: She is alert and oriented to person, place, and time. She has normal reflexes.  Skin: Skin is warm and dry.  Psychiatric: She has a normal mood and affect.          Assessment & Plan:  [name] was seen today for knee pain.  Diagnoses and associated orders for this visit:  Left medial knee pain - DG Knee Complete 4 Views Left; Future  Urinary frequency - POC Urinalysis Dipstick     Likely Osteoarthritis. Advised OTC Advil as needed. Xray of the knee. Follow-up pending results. Mentioned urinary frequency as I walked out the door, therefore UA obtained to screen for acute infection. Otherwise, advised to follow-up with PCP for chronic urinary concerns.

## 2014-07-30 NOTE — Patient Instructions (Signed)

## 2014-08-01 ENCOUNTER — Encounter: Payer: Self-pay | Admitting: Internal Medicine

## 2014-08-01 ENCOUNTER — Ambulatory Visit (INDEPENDENT_AMBULATORY_CARE_PROVIDER_SITE_OTHER): Payer: Medicare Other | Admitting: Internal Medicine

## 2014-08-01 ENCOUNTER — Other Ambulatory Visit (INDEPENDENT_AMBULATORY_CARE_PROVIDER_SITE_OTHER): Payer: Medicare Other

## 2014-08-01 VITALS — BP 144/60 | HR 63 | Temp 97.7°F | Resp 16 | Ht 63.0 in | Wt 166.0 lb

## 2014-08-01 DIAGNOSIS — Z23 Encounter for immunization: Secondary | ICD-10-CM

## 2014-08-01 DIAGNOSIS — Z0000 Encounter for general adult medical examination without abnormal findings: Secondary | ICD-10-CM

## 2014-08-01 DIAGNOSIS — Z418 Encounter for other procedures for purposes other than remedying health state: Secondary | ICD-10-CM

## 2014-08-01 DIAGNOSIS — I10 Essential (primary) hypertension: Secondary | ICD-10-CM

## 2014-08-01 DIAGNOSIS — Z299 Encounter for prophylactic measures, unspecified: Secondary | ICD-10-CM

## 2014-08-01 DIAGNOSIS — E785 Hyperlipidemia, unspecified: Secondary | ICD-10-CM

## 2014-08-01 LAB — LIPID PANEL
Cholesterol: 220 mg/dL — ABNORMAL HIGH (ref 0–200)
HDL: 32.6 mg/dL — ABNORMAL LOW (ref >39.00)
NONHDL: 187.4
TRIGLYCERIDES: 241 mg/dL — AB (ref 0.0–149.0)
Total CHOL/HDL Ratio: 7
VLDL: 48.2 mg/dL — AB (ref 0.0–40.0)

## 2014-08-01 LAB — BASIC METABOLIC PANEL
BUN: 23 mg/dL (ref 6–23)
CO2: 21 meq/L (ref 19–32)
Calcium: 9.3 mg/dL (ref 8.4–10.5)
Chloride: 110 mEq/L (ref 96–112)
Creatinine, Ser: 0.8 mg/dL (ref 0.4–1.2)
GFR: 72.75 mL/min (ref >60.00)
GLUCOSE: 93 mg/dL (ref 70–99)
Potassium: 4.2 mEq/L (ref 3.5–5.1)
SODIUM: 140 meq/L (ref 135–145)

## 2014-08-01 LAB — CBC
HCT: 41.1 % (ref 36.0–46.0)
Hemoglobin: 13.7 g/dL (ref 12.0–15.0)
MCHC: 33.5 g/dL (ref 30.0–36.0)
MCV: 89.5 fl (ref 78.0–100.0)
Platelets: 325 10*3/uL (ref 150.0–400.0)
RBC: 4.59 Mil/uL (ref 3.87–5.11)
RDW: 13.4 % (ref 11.5–15.5)
WBC: 8.5 10*3/uL (ref 4.0–10.5)

## 2014-08-01 LAB — LDL CHOLESTEROL, DIRECT: LDL DIRECT: 150.7 mg/dL

## 2014-08-01 LAB — URINE CULTURE

## 2014-08-01 MED ORDER — OMEPRAZOLE 20 MG PO CPDR: DELAYED_RELEASE_CAPSULE | ORAL | Status: DC

## 2014-08-01 MED ORDER — LOSARTAN POTASSIUM 50 MG PO TABS: 50.0000 mg | ORAL_TABLET | Freq: Every day | ORAL | Status: DC

## 2014-08-01 MED ORDER — TOPIRAMATE 100 MG PO TABS: ORAL_TABLET | ORAL | Status: DC

## 2014-08-01 MED ORDER — LOVASTATIN 40 MG PO TABS: ORAL_TABLET | ORAL | Status: DC

## 2014-08-01 NOTE — Addendum Note (Signed)
Addended by: Resa Miner R on: 08/01/2014 09:33 AM   Modules accepted: Orders

## 2014-08-01 NOTE — Progress Notes (Signed)
   Subjective:    Patient ID: [name], female    DOB: 1940/11/07, 73 y.o.   MRN: [id number]  HPI Here for medicare wellness. Once results of urine study as well as knee x-ray done 2 days ago. Knee doing better, urine improved. No other new complaints.  Diet: heart healthy Physical activity: sedentary Depression/mood screen: negative, increased stress due to husband's illness Hearing: intact to whispered voice Visual acuity: grossly normal, performs annual eye exam, recent cataract surgery ADLs: capable Fall risk: none Home safety: good Cognitive evaluation: intact to orientation, naming, recall and repetition EOL planning: adv directives, full code/ I agree  I have personally reviewed and have noted 1. The patient's medical and social history, noted new cataract surgery. 2. Their use of alcohol, tobacco or illicit drugs 3. Their current medications and supplements 4. The patient's functional ability including ADL's, fall risks, home safety risks and hearing or visual impairment. 5. Diet and physical activities 6. Evidence for depression or mood disorders 7. Care team reviewed and updated  Review of Systems  Constitutional: Negative for fever, activity change, appetite change, fatigue and unexpected weight change.  HENT: Negative.   Respiratory: Negative for cough, chest tightness, shortness of breath and wheezing.   Cardiovascular: Negative for chest pain, palpitations and leg swelling.  Gastrointestinal: Negative for nausea, abdominal pain, diarrhea, constipation and abdominal distention.  Genitourinary: Positive for frequency. Negative for dysuria and flank pain.  Musculoskeletal: Positive for arthralgias. Negative for myalgias, back pain and gait problem.  Skin: Negative.   Neurological: Negative.   Psychiatric/Behavioral: Negative.       Objective:   Physical Exam  Constitutional: She is oriented to person, place, and time. She appears well-developed and  well-nourished.  HENT:  Head: Normocephalic and atraumatic.  Eyes: EOM are normal.  Neck: Normal range of motion.  Cardiovascular: Normal rate and regular rhythm.   Pulmonary/Chest: Effort normal and breath sounds normal. No respiratory distress. She has no wheezes. She has no rales.  Abdominal: Soft. Bowel sounds are normal. She exhibits no distension. There is no tenderness. There is no rebound.  Neurological: She is alert and oriented to person, place, and time. Coordination normal.  Skin: Skin is warm and dry.   Filed Vitals:   08/01/14 0837  BP: 144/60  Pulse: 63  Temp: 97.7 F (36.5 C)  TempSrc: Oral  Resp: 16  Height: 5\' 3"  (1.6 m)  Weight: 166 lb (75.297 kg)  SpO2: 99%      Assessment & Plan:

## 2014-08-01 NOTE — Progress Notes (Signed)
Pre visit review using our clinic review tool, if applicable. No additional management support is needed unless otherwise documented below in the visit note. 

## 2014-08-01 NOTE — Assessment & Plan Note (Signed)
Given Prevnar. Other immunizations up-to-date. Talked with her about next colonoscopy, mammogram. Given a schedule of screening for the next 10 years. Talked with her about advanced care planning and she plans to make a living will after the first of the year with her husband. She is still considering the details of what she wants that to say. Negative screen for depression.

## 2014-08-01 NOTE — Patient Instructions (Signed)
We will check your blood work today. We will call you back with the results. You should be hearing back about your urine test in the next day or so. If you do not hear back please call the office.  We will see back as previously scheduled with your regular doctor.  Health Maintenance Adopting a healthy lifestyle and getting preventive care can go a long way to promote health and wellness. Talk with your health care provider about what schedule of regular examinations is right for you. This is a good chance for you to check in with your provider about disease prevention and staying healthy. In between checkups, there are plenty of things you can do on your own. Experts have done a lot of research about which lifestyle changes and preventive measures are most likely to keep you healthy. Ask your health care provider for more information. WEIGHT AND DIET  Eat a healthy diet  Be sure to include plenty of vegetables, fruits, low-fat dairy products, and lean protein.  Do not eat a lot of foods high in solid fats, added sugars, or salt.  Get regular exercise. This is one of the most important things you can do for your health.  Most adults should exercise for at least 150 minutes each week. The exercise should increase your heart rate and make you sweat (moderate-intensity exercise).  Most adults should also do strengthening exercises at least twice a week. This is in addition to the moderate-intensity exercise.  Maintain a healthy weight  Body mass index (BMI) is a measurement that can be used to identify possible weight problems. It estimates body fat based on height and weight. Your health care provider can help determine your BMI and help you achieve or maintain a healthy weight.  For females 25 years of age and older:   A BMI below 18.5 is considered underweight.  A BMI of 18.5 to 24.9 is normal.  A BMI of 25 to 29.9 is considered overweight.  A BMI of 30 and above is considered obese.   Watch levels of cholesterol and blood lipids  You should start having your blood tested for lipids and cholesterol at 72 years of age, then have this test every 5 years.  You may need to have your cholesterol levels checked more often if:  Your lipid or cholesterol levels are high.  You are older than 72 years of age.  You are at high risk for heart disease.  CANCER SCREENING   Lung Cancer  Lung cancer screening is recommended for adults 33-36 years old who are at high risk for lung cancer because of a history of smoking.  A yearly low-dose CT scan of the lungs is recommended for people who:  Currently smoke.  Have quit within the past 15 years.  Have at least a 30-pack-year history of smoking. A pack year is smoking an average of one pack of cigarettes a day for 1 year.  Yearly screening should continue until it has been 15 years since you quit.  Yearly screening should stop if you develop a health problem that would prevent you from having lung cancer treatment.  Breast Cancer  Practice breast self-awareness. This means understanding how your breasts normally appear and feel.  It also means doing regular breast self-exams. Let your health care provider know about any changes, no matter how small.  If you are in your 20s or 30s, you should have a clinical breast exam (CBE) by a health care provider every  provider every 1-3 years as part of a regular health exam.  If you are 40 or older, have a CBE every year. Also consider having a breast X-ray (mammogram) every year.  If you have a family history of breast cancer, talk to your health care provider about genetic screening.  If you are at high risk for breast cancer, talk to your health care provider about having an MRI and a mammogram every year.  Breast cancer gene (BRCA) assessment is recommended for women who have family members with BRCA-related cancers. BRCA-related cancers  include:  Breast.  Ovarian.  Tubal.  Peritoneal cancers.  Results of the assessment will determine the need for genetic counseling and BRCA1 and BRCA2 testing. Cervical Cancer Routine pelvic examinations to screen for cervical cancer are no longer recommended for nonpregnant women who are considered low risk for cancer of the pelvic organs (ovaries, uterus, and vagina) and who do not have symptoms. A pelvic examination may be necessary if you have symptoms including those associated with pelvic infections. Ask your health care provider if a screening pelvic exam is right for you.   The Pap test is the screening test for cervical cancer for women who are considered at risk.  If you had a hysterectomy for a problem that was not cancer or a condition that could lead to cancer, then you no longer need Pap tests.  If you are older than 65 years, and you have had normal Pap tests for the past 10 years, you no longer need to have Pap tests.  If you have had past treatment for cervical cancer or a condition that could lead to cancer, you need Pap tests and screening for cancer for at least 20 years after your treatment.  If you no longer get a Pap test, assess your risk factors if they change (such as having a new sexual partner). This can affect whether you should start being screened again.  Some women have medical problems that increase their chance of getting cervical cancer. If this is the case for you, your health care provider may recommend more frequent screening and Pap tests.  The human papillomavirus (HPV) test is another test that may be used for cervical cancer screening. The HPV test looks for the virus that can cause cell changes in the cervix. The cells collected during the Pap test can be tested for HPV.  The HPV test can be used to screen women 30 years of age and older. Getting tested for HPV can extend the interval between normal Pap tests from three to five years.  An HPV  test also should be used to screen women of any age who have unclear Pap test results.  After 72 years of age, women should have HPV testing as often as Pap tests.  Colorectal Cancer  This type of cancer can be detected and often prevented.  Routine colorectal cancer screening usually begins at 72 years of age and continues through 72 years of age.  Your health care provider may recommend screening at an earlier age if you have risk factors for colon cancer.  Your health care provider may also recommend using home test kits to check for hidden blood in the stool.  A small camera at the end of a tube can be used to examine your colon directly (sigmoidoscopy or colonoscopy). This is done to check for the earliest forms of colorectal cancer.  Routine screening usually begins at age 50.  Direct examination of the colon   should be repeated every 5-10 years through 72 years of age. However, you may need to be screened more often if early forms of precancerous polyps or small growths are found. Skin Cancer  Check your skin from head to toe regularly.  Tell your health care provider about any new moles or changes in moles, especially if there is a change in a mole's shape or color.  Also tell your health care provider if you have a mole that is larger than the size of a pencil eraser.  Always use sunscreen. Apply sunscreen liberally and repeatedly throughout the day.  Protect yourself by wearing long sleeves, pants, a wide-brimmed hat, and sunglasses whenever you are outside. HEART DISEASE, DIABETES, AND HIGH BLOOD PRESSURE   Have your blood pressure checked at least every 1-2 years. High blood pressure causes heart disease and increases the risk of stroke.  If you are between 55 years and 79 years old, ask your health care provider if you should take aspirin to prevent strokes.  Have regular diabetes screenings. This involves taking a blood sample to check your fasting blood sugar  level.  If you are at a normal weight and have a low risk for diabetes, have this test once every three years after 72 years of age.  If you are overweight and have a high risk for diabetes, consider being tested at a younger age or more often. PREVENTING INFECTION  Hepatitis B  If you have a higher risk for hepatitis B, you should be screened for this virus. You are considered at high risk for hepatitis B if:  You were born in a country where hepatitis B is common. Ask your health care provider which countries are considered high risk.  Your parents were born in a high-risk country, and you have not been immunized against hepatitis B (hepatitis B vaccine).  You have HIV or AIDS.  You use needles to inject street drugs.  You live with someone who has hepatitis B.  You have had sex with someone who has hepatitis B.  You get hemodialysis treatment.  You take certain medicines for conditions, including cancer, organ transplantation, and autoimmune conditions. Hepatitis C  Blood testing is recommended for:  Everyone born from 1945 through 1965.  Anyone with known risk factors for hepatitis C. Sexually transmitted infections (STIs)  You should be screened for sexually transmitted infections (STIs) including gonorrhea and chlamydia if:  You are sexually active and are younger than 72 years of age.  You are older than 72 years of age and your health care provider tells you that you are at risk for this type of infection.  Your sexual activity has changed since you were last screened and you are at an increased risk for chlamydia or gonorrhea. Ask your health care provider if you are at risk.  If you do not have HIV, but are at risk, it may be recommended that you take a prescription medicine daily to prevent HIV infection. This is called pre-exposure prophylaxis (PrEP). You are considered at risk if:  You are sexually active and do not regularly use condoms or know the HIV status  of your partner(s).  You take drugs by injection.  You are sexually active with a partner who has HIV. Talk with your health care provider about whether you are at high risk of being infected with HIV. If you choose to begin PrEP, you should first be tested for HIV. You should then be tested every 3 months for   as long as you are taking PrEP.  PREGNANCY   If you are premenopausal and you may become pregnant, ask your health care provider about preconception counseling.  If you may become pregnant, take 400 to 800 micrograms (mcg) of folic acid every day.  If you want to prevent pregnancy, talk to your health care provider about birth control (contraception). OSTEOPOROSIS AND MENOPAUSE   Osteoporosis is a disease in which the bones lose minerals and strength with aging. This can result in serious bone fractures. Your risk for osteoporosis can be identified using a bone density scan.  If you are 65 years of age or older, or if you are at risk for osteoporosis and fractures, ask your health care provider if you should be screened.  Ask your health care provider whether you should take a calcium or vitamin D supplement to lower your risk for osteoporosis.  Menopause may have certain physical symptoms and risks.  Hormone replacement therapy may reduce some of these symptoms and risks. Talk to your health care provider about whether hormone replacement therapy is right for you.  HOME CARE INSTRUCTIONS   Schedule regular health, dental, and eye exams.  Stay current with your immunizations.   Do not use any tobacco products including cigarettes, chewing tobacco, or electronic cigarettes.  If you are pregnant, do not drink alcohol.  If you are breastfeeding, limit how much and how often you drink alcohol.  Limit alcohol intake to no more than 1 drink per day for nonpregnant women. One drink equals 12 ounces of beer, 5 ounces of wine, or 1 ounces of hard liquor.  Do not use street  drugs.  Do not share needles.  Ask your health care provider for help if you need support or information about quitting drugs.  Tell your health care provider if you often feel depressed.  Tell your health care provider if you have ever been abused or do not feel safe at home. Document Released: 02/15/2011 Document Revised: 12/17/2013 Document Reviewed: 07/04/2013 Exit[employer] Patient Information 2015 [employer]. This information is not intended to replace advice given to you by your health care provider. Make sure you discuss any questions you have with your health care provider.  

## 2014-08-02 ENCOUNTER — Telehealth: Payer: Self-pay | Admitting: Internal Medicine

## 2014-08-02 NOTE — Telephone Encounter (Signed)
Pt saw NP on 07/31/15 and would like blood work results

## 2014-08-02 NOTE — Telephone Encounter (Signed)
Left message for pt to call back  °

## 2014-08-26 ENCOUNTER — Other Ambulatory Visit: Payer: Self-pay | Admitting: Internal Medicine

## 2014-09-11 ENCOUNTER — Other Ambulatory Visit: Payer: Self-pay

## 2014-09-11 DIAGNOSIS — Z1231 Encounter for screening mammogram for malignant neoplasm of breast: Secondary | ICD-10-CM

## 2014-10-10 ENCOUNTER — Ambulatory Visit
Admission: RE | Admit: 2014-10-10 | Discharge: 2014-10-10 | Disposition: A | Discharge status: Home / Self Care | Payer: Medicare Other | Source: Ambulatory Visit

## 2014-10-10 DIAGNOSIS — Z1231 Encounter for screening mammogram for malignant neoplasm of breast: Secondary | ICD-10-CM

## 2014-10-23 DIAGNOSIS — M545 Low back pain: Secondary | ICD-10-CM | POA: Diagnosis not present

## 2014-10-23 DIAGNOSIS — M5126 Other intervertebral disc displacement, lumbar region: Secondary | ICD-10-CM | POA: Diagnosis not present

## 2014-10-23 DIAGNOSIS — M47896 Other spondylosis, lumbar region: Secondary | ICD-10-CM | POA: Diagnosis not present

## 2014-10-24 DIAGNOSIS — H26493 Other secondary cataract, bilateral: Secondary | ICD-10-CM | POA: Diagnosis not present

## 2014-10-25 ENCOUNTER — Other Ambulatory Visit: Payer: Self-pay | Admitting: Rehabilitation

## 2014-10-25 DIAGNOSIS — M47816 Spondylosis without myelopathy or radiculopathy, lumbar region: Secondary | ICD-10-CM

## 2014-10-26 ENCOUNTER — Ambulatory Visit
Admission: RE | Admit: 2014-10-26 | Discharge: 2014-10-26 | Disposition: A | Discharge status: Home / Self Care | Payer: Medicare Other | Source: Ambulatory Visit | Attending: Rehabilitation | Admitting: Rehabilitation

## 2014-10-26 DIAGNOSIS — M47816 Spondylosis without myelopathy or radiculopathy, lumbar region: Secondary | ICD-10-CM | POA: Diagnosis not present

## 2014-10-26 DIAGNOSIS — M4806 Spinal stenosis, lumbar region: Secondary | ICD-10-CM | POA: Diagnosis not present

## 2014-11-02 ENCOUNTER — Other Ambulatory Visit: Payer: Self-pay | Admitting: Internal Medicine

## 2014-11-07 DIAGNOSIS — M47896 Other spondylosis, lumbar region: Secondary | ICD-10-CM | POA: Diagnosis not present

## 2014-11-07 DIAGNOSIS — M5126 Other intervertebral disc displacement, lumbar region: Secondary | ICD-10-CM | POA: Diagnosis not present

## 2014-11-07 DIAGNOSIS — M5417 Radiculopathy, lumbosacral region: Secondary | ICD-10-CM | POA: Diagnosis not present

## 2014-11-12 ENCOUNTER — Other Ambulatory Visit: Payer: Self-pay | Admitting: Internal Medicine

## 2014-11-12 NOTE — Telephone Encounter (Signed)
Pt called in and said that OD dr said that she can take 3 traMADol (ULTRAM) 50 MG tablet [[id number]  A day and she wanted to see if Dr Asa Lente can write her a new scrirpt for 3 day instead of 2    [facility] on [address]

## 2014-11-12 NOTE — Telephone Encounter (Signed)
Rx as requested is pended for PCP review.   Old rx was for #180. New rx is for #90.

## 2014-11-13 MED ORDER — TRAMADOL HCL 50 MG PO TABS: 50.0000 mg | ORAL_TABLET | Freq: Three times a day (TID) | ORAL | Status: DC

## 2014-11-13 NOTE — Telephone Encounter (Signed)
Ok to change to q8h prn Does pt need 90d supply (#180) or 30d supply (#90) thanks

## 2014-11-13 NOTE — Telephone Encounter (Signed)
[facility]

## 2014-12-26 DIAGNOSIS — M5126 Other intervertebral disc displacement, lumbar region: Secondary | ICD-10-CM | POA: Diagnosis not present

## 2014-12-26 DIAGNOSIS — M545 Low back pain: Secondary | ICD-10-CM | POA: Diagnosis not present

## 2014-12-26 DIAGNOSIS — M5417 Radiculopathy, lumbosacral region: Secondary | ICD-10-CM | POA: Diagnosis not present

## 2015-01-14 DIAGNOSIS — M5416 Radiculopathy, lumbar region: Secondary | ICD-10-CM | POA: Diagnosis not present

## 2015-02-25 DIAGNOSIS — Q762 Congenital spondylolisthesis: Secondary | ICD-10-CM | POA: Diagnosis not present

## 2015-02-25 DIAGNOSIS — M5416 Radiculopathy, lumbar region: Secondary | ICD-10-CM | POA: Diagnosis not present

## 2015-02-25 DIAGNOSIS — M545 Low back pain: Secondary | ICD-10-CM | POA: Diagnosis not present

## 2015-04-01 DIAGNOSIS — M5126 Other intervertebral disc displacement, lumbar region: Secondary | ICD-10-CM | POA: Diagnosis not present

## 2015-06-06 ENCOUNTER — Telehealth: Payer: Self-pay

## 2015-06-06 NOTE — Telephone Encounter (Signed)
Refill request for Tramadol ([facility] [phone])

## 2015-06-09 MED ORDER — TRAMADOL HCL 50 MG PO TABS: 50.0000 mg | ORAL_TABLET | Freq: Three times a day (TID) | ORAL | Status: DC

## 2015-06-09 NOTE — Telephone Encounter (Signed)
rx refill ok - printed and signed

## 2015-06-17 ENCOUNTER — Other Ambulatory Visit: Payer: Self-pay | Admitting: Internal Medicine

## 2015-06-25 DIAGNOSIS — C44622 Squamous cell carcinoma of skin of right upper limb, including shoulder: Secondary | ICD-10-CM | POA: Diagnosis not present

## 2015-06-25 DIAGNOSIS — D0462 Carcinoma in situ of skin of left upper limb, including shoulder: Secondary | ICD-10-CM | POA: Diagnosis not present

## 2015-06-25 DIAGNOSIS — D485 Neoplasm of uncertain behavior of skin: Secondary | ICD-10-CM | POA: Diagnosis not present

## 2015-07-14 DIAGNOSIS — C44622 Squamous cell carcinoma of skin of right upper limb, including shoulder: Secondary | ICD-10-CM | POA: Diagnosis not present

## 2015-07-14 DIAGNOSIS — Z8619 Personal history of other infectious and parasitic diseases: Secondary | ICD-10-CM | POA: Diagnosis not present

## 2015-07-14 DIAGNOSIS — D0462 Carcinoma in situ of skin of left upper limb, including shoulder: Secondary | ICD-10-CM | POA: Diagnosis not present

## 2015-07-14 DIAGNOSIS — B001 Herpesviral vesicular dermatitis: Secondary | ICD-10-CM | POA: Diagnosis not present

## 2015-08-05 ENCOUNTER — Encounter: Payer: Self-pay | Admitting: Internal Medicine

## 2015-08-05 ENCOUNTER — Ambulatory Visit (INDEPENDENT_AMBULATORY_CARE_PROVIDER_SITE_OTHER): Payer: Medicare Other | Admitting: Internal Medicine

## 2015-08-05 VITALS — BP 138/80 | HR 75 | Temp 97.6°F | Resp 16 | Wt 166.0 lb

## 2015-08-05 DIAGNOSIS — R1010 Upper abdominal pain, unspecified: Secondary | ICD-10-CM

## 2015-08-05 DIAGNOSIS — R109 Unspecified abdominal pain: Secondary | ICD-10-CM | POA: Insufficient documentation

## 2015-08-05 DIAGNOSIS — G43809 Other migraine, not intractable, without status migrainosus: Secondary | ICD-10-CM

## 2015-08-05 DIAGNOSIS — G479 Sleep disorder, unspecified: Secondary | ICD-10-CM | POA: Diagnosis not present

## 2015-08-05 DIAGNOSIS — M4806 Spinal stenosis, lumbar region: Secondary | ICD-10-CM

## 2015-08-05 DIAGNOSIS — E785 Hyperlipidemia, unspecified: Secondary | ICD-10-CM

## 2015-08-05 DIAGNOSIS — I10 Essential (primary) hypertension: Secondary | ICD-10-CM

## 2015-08-05 DIAGNOSIS — Z0000 Encounter for general adult medical examination without abnormal findings: Secondary | ICD-10-CM | POA: Diagnosis not present

## 2015-08-05 DIAGNOSIS — N644 Mastodynia: Secondary | ICD-10-CM | POA: Diagnosis not present

## 2015-08-05 DIAGNOSIS — C44621 Squamous cell carcinoma of skin of unspecified upper limb, including shoulder: Secondary | ICD-10-CM | POA: Insufficient documentation

## 2015-08-05 DIAGNOSIS — K219 Gastro-esophageal reflux disease without esophagitis: Secondary | ICD-10-CM

## 2015-08-05 DIAGNOSIS — Z85828 Personal history of other malignant neoplasm of skin: Secondary | ICD-10-CM

## 2015-08-05 DIAGNOSIS — M48061 Spinal stenosis, lumbar region without neurogenic claudication: Secondary | ICD-10-CM

## 2015-08-05 DIAGNOSIS — C44622 Squamous cell carcinoma of skin of right upper limb, including shoulder: Secondary | ICD-10-CM

## 2015-08-05 MED ORDER — LOSARTAN POTASSIUM 50 MG PO TABS: 50.0000 mg | ORAL_TABLET | Freq: Every day | ORAL | Status: DC

## 2015-08-05 MED ORDER — TRAZODONE HCL 50 MG PO TABS: ORAL_TABLET | ORAL | Status: DC

## 2015-08-05 MED ORDER — OMEPRAZOLE 20 MG PO CPDR: DELAYED_RELEASE_CAPSULE | ORAL | Status: DC

## 2015-08-05 MED ORDER — TOPIRAMATE 100 MG PO TABS: 100.0000 mg | ORAL_TABLET | Freq: Every day | ORAL | Status: DC

## 2015-08-05 MED ORDER — TRAMADOL HCL 50 MG PO TABS: 50.0000 mg | ORAL_TABLET | Freq: Three times a day (TID) | ORAL | Status: DC

## 2015-08-05 MED ORDER — LOVASTATIN 40 MG PO TABS: ORAL_TABLET | ORAL | Status: DC

## 2015-08-05 NOTE — Assessment & Plan Note (Signed)
Taking lovastatin 40 mg-occasionally she does forget the medication Exercising regularly Will check lipid panel and it was slightly elevated last year She is unsure if she has tried any other statin, but the benefit from stronger statin Increase exercise and work on weight loss

## 2015-08-05 NOTE — Assessment & Plan Note (Signed)
Present for a while-upper abdomen Exam is benign. Possible sensitivity for wearing tight jeans versus possible ventral hernia She will monitor for now She will stop wearing the jeans Call or return if symptoms persist

## 2015-08-05 NOTE — Assessment & Plan Note (Addendum)
Takes topamax daily imitrex only as needed No migraine in a long time Continue current medication regimen

## 2015-08-05 NOTE — Assessment & Plan Note (Signed)
She does have some tenderness on exam, no palpable lump Discussed doing a diagnostic mammogram, but she wanted to monitor for now. If pain continues she will call me early next year. Discussed possibly waiting for screening mammogram, but that is a couple of months away

## 2015-08-05 NOTE — Progress Notes (Signed)
Subjective:    Patient ID: [name], female    DOB: [birth year], 74 y.o.   MRN: [id number]  HPI She is here to establish with a new pcp.   She is here for a physical exam.   Breast soreness, right:  She noticed this three weeks ago.  She denies known trauma.  She thinks it feels a little puffy there, but she denies a lump.  Her last mammogram was February 2017.  Abdominal soreness: she soreness in her upper abdomen.  It has been there for awhile.  The skin is just a little sensitive.  If she pushes in it is a little sore - she thinks it is superficial.    Medications and allergies reviewed with patient and updated if appropriate.  Patient Active Problem List   Diagnosis Date Noted  . Squamous cell cancer of skin of hand 08/05/2015  . Difficulty sleeping 08/05/2015  . Acute bronchitis due to Streptococcus 06/03/2014  . Loss of weight   . NONSPECIFIC ABNORMAL ELECTROCARDIOGRAM 09/15/2010  . GERD 01/09/2010  . DIVERTICULOSIS, COLON 01/09/2010  . Spinal stenosis of lumbar region 01/09/2010  . PLANTAR FASCIITIS 01/09/2010  . DYSLIPIDEMIA 12/29/2009  . DEPRESSION 12/29/2009  . Migraine headache 12/29/2009  . HYPERTENSION 12/29/2009    Current Outpatient Prescriptions on File Prior to Visit  Medication Sig Dispense Refill  . Calcium Citrate-Vitamin D (CITRACAL PETITES/VITAMIN D) 200-250 MG-UNIT TABS Take by mouth 2 (two) times daily.      . Cholecalciferol (VITAMIN D3) 1000 UNITS CAPS Take by mouth daily.      Marland Kitchen losartan (COZAAR) 50 MG tablet Take 1 tablet (50 mg total) by mouth daily. 90 tablet 3  . lovastatin (MEVACOR) 40 MG tablet TAKE 1 TABLET (40 MG TOTAL) BY MOUTH AT BEDTIME. 90 tablet 3  . Multiple Vitamin (MULTIVITAMIN) tablet Take 1 tablet by mouth daily.      Marland Kitchen omeprazole (PRILOSEC) 20 MG capsule TAKE 1 CAPSULE (20 MG TOTAL) BY MOUTH DAILY. 90 capsule 3  . SUMAtriptan (IMITREX) 100 MG tablet Take 1 tablet (100 mg total) by mouth every 2 (two) hours as needed. 10 tablet  0  . topiramate (TOPAMAX) 100 MG tablet TAKE 1 TABLET BY MOUTH DAILY 90 tablet 0  . traMADol (ULTRAM) 50 MG tablet Take 1 tablet (50 mg total) by mouth every 8 (eight) hours. 270 tablet 1  . traZODone (DESYREL) 50 MG tablet TAKE 1 TABLET BY MOUTH AT BEDTIME 90 tablet 0   No current facility-administered medications on file prior to visit.    Past Medical History  Diagnosis Date  . MIGRAINE HEADACHE   . DIVERTICULOSIS, COLON   . SPINAL STENOSIS   . PLANTAR FASCIITIS   . NONSPECIFIC ABNORMAL ELECTROCARDIOGRAM 09/15/2010    normal stress test 09/2010  . DEPRESSION   . GERD   . DYSLIPIDEMIA   . HYPERTENSION   . Multinodular goiter     Past Surgical History  Procedure Laterality Date  . Tonsillectomy    . Child birth      x's 2 50 & 18  . Cataract extraction Left 07/05/13    [facility]  . Cataract extraction  winter 2015    Social History   Social History  . Marital Status: Married    Spouse Name: N/A  . Number of Children: N/A  . Years of Education: N/A   Social History Main Topics  . Smoking status: Never Smoker   . Smokeless tobacco: None  Comment: Married, lives with spouse and 2 sons involved with social activites with spouse at [employer] and church  . Alcohol Use: No  . Drug Use: No  . Sexual Activity: Not Asked   Other Topics Concern  . None   Social History Narrative    Review of Systems  Constitutional: Negative for fever, chills, appetite change and unexpected weight change.  HENT: Negative for hearing loss.   Eyes: Negative for visual disturbance.  Respiratory: Negative for cough, shortness of breath and wheezing.   Cardiovascular: Negative for chest pain, palpitations and leg swelling.  Gastrointestinal: Positive for abdominal pain (upper abdominal soreness). Negative for nausea, diarrhea, constipation and blood in stool.       No GERD  Genitourinary: Negative for dysuria and hematuria.  Musculoskeletal: Positive for back pain.  Neurological:  Negative for dizziness, weakness, light-headedness, numbness and headaches.  Psychiatric/Behavioral: Positive for sleep disturbance and dysphoric mood (mild ). The patient is not nervous/anxious.        Objective:   Filed Vitals:   08/05/15 1036  BP: 138/80  Pulse: 75  Temp: 97.6 F (36.4 C)  Resp: 16   Filed Weights   08/05/15 1036  Weight: 166 lb (75.297 kg)   Body mass index is 29.41 kg/(m^2).   Physical Exam Constitutional: She appears well-developed and well-nourished. No distress.  HENT:  Head: Normocephalic and atraumatic.  Right Ear: External ear normal.  Left Ear: External ear normal.  Mouth/Throat: Oropharynx is clear and moist.  Normal bilateral ear canals and tympanic membranes  Eyes: Conjunctivae and EOM are normal.  Neck: Neck supple. No tracheal deviation present. No thyromegaly present.  No carotid bruit  Cardiovascular: Normal rate, regular rhythm and normal heart sounds.  No murmur heard.  Pulmonary/Chest: Effort normal and breath sounds normal. No respiratory distress. She has no wheezes. She has no rales.  Breast: tenderness with palpation right lateral breast, but no palpable lump, no skin change or axillary lymphadenopathy. Left breast exam normal  Abdominal: Soft. She exhibits no distension. minimal tenderness in region between epigastric region and umbilical. No guarding or rebound. No skin changes. ? Ventral hernia.  Musculoskeletal: She exhibits no edema.  Lymphadenopathy:  She has no cervical adenopathy.  Skin: Skin is warm and dry. She is not diaphoretic.  Psychiatric: She has a normal mood and affect. Her behavior is normal.         Assessment & Plan:   Physical exam: Screening blood work ordered immnizations up to date Colonoscopy up to date dexa up to date 68 - has not seen one in about 5 years Eye exam Sees derm at least once a year given her history of skin cancer Exercising minimally-advised increasing her exercise Work on  weight loss Discussed depression. She has some mild depression which she feels is mostly related to her husband's depression and not feeling well. She does not feel she needs meds now    See problem list for assessment and plan an acute chronic problems  Follow-up at least once a year

## 2015-08-05 NOTE — Assessment & Plan Note (Addendum)
She takes trazodone only as needed - typically if she wakes up early in the night and can not go back to sleep It does not always work -- continue as needed - take 50-100 mg nightly as needed

## 2015-08-05 NOTE — Assessment & Plan Note (Signed)
BP Readings from Last 3 Encounters:  08/05/15 138/80  08/01/14 144/60  07/30/14 130/50   bp controlled Continue current medication

## 2015-08-05 NOTE — Assessment & Plan Note (Signed)
Follows with orthopedics Has done injections Takes tramadol daily

## 2015-08-05 NOTE — Patient Instructions (Addendum)
We have reviewed your prior records including labs and tests today.  Test(s) ordered today. Your results will be released to [place] (or called to you) after review, usually within 72hours after test completion. If any changes need to be made, you will be notified at that same time.  All other Health Maintenance issues reviewed.   All recommended immunizations and age-appropriate screenings are up-to-date.  No immunizations administered today.   Medications reviewed and updated.  No changes recommended at this time.  Your prescription(s) have been submitted to your pharmacy. Please take as directed and contact our office if you believe you are having problem(s) with the medication(s).   Health Maintenance, Female Adopting a healthy lifestyle and getting preventive care can go a long way to promote health and wellness. Talk with your health care provider about what schedule of regular examinations is right for you. This is a good chance for you to check in with your provider about disease prevention and staying healthy. In between checkups, there are plenty of things you can do on your own. Experts have done a lot of research about which lifestyle changes and preventive measures are most likely to keep you healthy. Ask your health care provider for more information. WEIGHT AND DIET  Eat a healthy diet  Be sure to include plenty of vegetables, fruits, low-fat dairy products, and lean protein.  Do not eat a lot of foods high in solid fats, added sugars, or salt.  Get regular exercise. This is one of the most important things you can do for your health.  Most adults should exercise for at least 150 minutes each week. The exercise should increase your heart rate and make you sweat (moderate-intensity exercise).  Most adults should also do strengthening exercises at least twice a week. This is in addition to the moderate-intensity exercise.  Maintain a healthy weight  Body mass index (BMI)  is a measurement that can be used to identify possible weight problems. It estimates body fat based on height and weight. Your health care provider can help determine your BMI and help you achieve or maintain a healthy weight.  For females 72 years of age and older:   A BMI below 18.5 is considered underweight.  A BMI of 18.5 to 24.9 is normal.  A BMI of 25 to 29.9 is considered overweight.  A BMI of 30 and above is considered obese.  Watch levels of cholesterol and blood lipids  You should start having your blood tested for lipids and cholesterol at 73 years of age, then have this test every 5 years.  You may need to have your cholesterol levels checked more often if:  Your lipid or cholesterol levels are high.  You are older than 73 years of age.  You are at high risk for heart disease.  CANCER SCREENING   Lung Cancer  Lung cancer screening is recommended for adults 74-67 years old who are at high risk for lung cancer because of a history of smoking.  A yearly low-dose CT scan of the lungs is recommended for people who:  Currently smoke.  Have quit within the past 15 years.  Have at least a 30-pack-year history of smoking. A pack year is smoking an average of one pack of cigarettes a day for 1 year.  Yearly screening should continue until it has been 15 years since you quit.  Yearly screening should stop if you develop a health problem that would prevent you from having lung cancer treatment.  Breast Cancer  Practice breast self-awareness. This means understanding how your breasts normally appear and feel.  It also means doing regular breast self-exams. Let your health care provider know about any changes, no matter how small.  If you are in your 20s or 30s, you should have a clinical breast exam (CBE) by a health care provider every 1-3 years as part of a regular health exam.  If you are 42 or older, have a CBE every year. Also consider having a breast X-ray  (mammogram) every year.  If you have a family history of breast cancer, talk to your health care provider about genetic screening.  If you are at high risk for breast cancer, talk to your health care provider about having an MRI and a mammogram every year.  Breast cancer gene (BRCA) assessment is recommended for women who have family members with BRCA-related cancers. BRCA-related cancers include:  Breast.  Ovarian.  Tubal.  Peritoneal cancers.  Results of the assessment will determine the need for genetic counseling and BRCA1 and BRCA2 testing. Cervical Cancer Your health care provider may recommend that you be screened regularly for cancer of the pelvic organs (ovaries, uterus, and vagina). This screening involves a pelvic examination, including checking for microscopic changes to the surface of your cervix (Pap test). You may be encouraged to have this screening done every 3 years, beginning at age 58.  For women ages 23-65, health care providers may recommend pelvic exams and Pap testing every 3 years, or they may recommend the Pap and pelvic exam, combined with testing for human papilloma virus (HPV), every 5 years. Some types of HPV increase your risk of cervical cancer. Testing for HPV may also be done on women of any age with unclear Pap test results.  Other health care providers may not recommend any screening for nonpregnant women who are considered low risk for pelvic cancer and who do not have symptoms. Ask your health care provider if a screening pelvic exam is right for you.  If you have had past treatment for cervical cancer or a condition that could lead to cancer, you need Pap tests and screening for cancer for at least 20 years after your treatment. If Pap tests have been discontinued, your risk factors (such as having a new sexual partner) need to be reassessed to determine if screening should resume. Some women have medical problems that increase the chance of getting  cervical cancer. In these cases, your health care provider may recommend more frequent screening and Pap tests. Colorectal Cancer  This type of cancer can be detected and often prevented.  Routine colorectal cancer screening usually begins at 73 years of age and continues through 73 years of age.  Your health care provider may recommend screening at an earlier age if you have risk factors for colon cancer.  Your health care provider may also recommend using home test kits to check for hidden blood in the stool.  A small camera at the end of a tube can be used to examine your colon directly (sigmoidoscopy or colonoscopy). This is done to check for the earliest forms of colorectal cancer.  Routine screening usually begins at age 77.  Direct examination of the colon should be repeated every 5-10 years through 73 years of age. However, you may need to be screened more often if early forms of precancerous polyps or small growths are found. Skin Cancer  Check your skin from head to toe regularly.  Tell your health care  provider about any new moles or changes in moles, especially if there is a change in a mole's shape or color.  Also tell your health care provider if you have a mole that is larger than the size of a pencil eraser.  Always use sunscreen. Apply sunscreen liberally and repeatedly throughout the day.  Protect yourself by wearing long sleeves, pants, a wide-brimmed hat, and sunglasses whenever you are outside. HEART DISEASE, DIABETES, AND HIGH BLOOD PRESSURE   High blood pressure causes heart disease and increases the risk of stroke. High blood pressure is more likely to develop in:  People who have blood pressure in the high end of the normal range (130-139/85-89 mm Hg).  People who are overweight or obese.  People who are African American.  If you are 20-79 years of age, have your blood pressure checked every 3-5 years. If you are 34 years of age or older, have your blood  pressure checked every year. You should have your blood pressure measured twice--once when you are at a hospital or clinic, and once when you are not at a hospital or clinic. Record the average of the two measurements. To check your blood pressure when you are not at a hospital or clinic, you can use:  An automated blood pressure machine at a pharmacy.  A home blood pressure monitor.  If you are between 73 years and 54 years old, ask your health care provider if you should take aspirin to prevent strokes.  Have regular diabetes screenings. This involves taking a blood sample to check your fasting blood sugar level.  If you are at a normal weight and have a low risk for diabetes, have this test once every three years after 73 years of age.  If you are overweight and have a high risk for diabetes, consider being tested at a younger age or more often. PREVENTING INFECTION  Hepatitis B  If you have a higher risk for hepatitis B, you should be screened for this virus. You are considered at high risk for hepatitis B if:  You were born in a country where hepatitis B is common. Ask your health care provider which countries are considered high risk.  Your parents were born in a high-risk country, and you have not been immunized against hepatitis B (hepatitis B vaccine).  You have HIV or AIDS.  You use needles to inject street drugs.  You live with someone who has hepatitis B.  You have had sex with someone who has hepatitis B.  You get hemodialysis treatment.  You take certain medicines for conditions, including cancer, organ transplantation, and autoimmune conditions. Hepatitis C  Blood testing is recommended for:  Everyone born from [birth year] through 1964.  Anyone with known risk factors for hepatitis C. Sexually transmitted infections (STIs)  You should be screened for sexually transmitted infections (STIs) including gonorrhea and chlamydia if:  You are sexually active and are  younger than 73 years of age.  You are older than 73 years of age and your health care provider tells you that you are at risk for this type of infection.  Your sexual activity has changed since you were last screened and you are at an increased risk for chlamydia or gonorrhea. Ask your health care provider if you are at risk.  If you do not have HIV, but are at risk, it may be recommended that you take a prescription medicine daily to prevent HIV infection. This is called pre-exposure prophylaxis (PrEP). You are  considered at risk if:  You are sexually active and do not regularly use condoms or know the HIV status of your partner(s).  You take drugs by injection.  You are sexually active with a partner who has HIV. Talk with your health care provider about whether you are at high risk of being infected with HIV. If you choose to begin PrEP, you should first be tested for HIV. You should then be tested every 3 months for as long as you are taking PrEP.  PREGNANCY   If you are premenopausal and you may become pregnant, ask your health care provider about preconception counseling.  If you may become pregnant, take 400 to 800 micrograms (mcg) of folic acid every day.  If you want to prevent pregnancy, talk to your health care provider about birth control (contraception). OSTEOPOROSIS AND MENOPAUSE   Osteoporosis is a disease in which the bones lose minerals and strength with aging. This can result in serious bone fractures. Your risk for osteoporosis can be identified using a bone density scan.  If you are 8 years of age or older, or if you are at risk for osteoporosis and fractures, ask your health care provider if you should be screened.  Ask your health care provider whether you should take a calcium or vitamin D supplement to lower your risk for osteoporosis.  Menopause may have certain physical symptoms and risks.  Hormone replacement therapy may reduce some of these symptoms and  risks. Talk to your health care provider about whether hormone replacement therapy is right for you.  HOME CARE INSTRUCTIONS   Schedule regular health, dental, and eye exams.  Stay current with your immunizations.   Do not use any tobacco products including cigarettes, chewing tobacco, or electronic cigarettes.  If you are pregnant, do not drink alcohol.  If you are breastfeeding, limit how much and how often you drink alcohol.  Limit alcohol intake to no more than 1 drink per day for nonpregnant women. One drink equals 12 ounces of beer, 5 ounces of wine, or 1 ounces of hard liquor.  Do not use street drugs.  Do not share needles.  Ask your health care provider for help if you need support or information about quitting drugs.  Tell your health care provider if you often feel depressed.  Tell your health care provider if you have ever been abused or do not feel safe at home.   This information is not intended to replace advice given to you by your health care provider. Make sure you discuss any questions you have with your health care provider.   Document Released: 02/15/2011 Document Revised: 08/23/2014 Document Reviewed: 07/04/2013 Elsevier Interactive Patient Education [employer].

## 2015-08-05 NOTE — Assessment & Plan Note (Signed)
Controlled. Continue daily omeprazole

## 2015-08-05 NOTE — Progress Notes (Signed)
Pre visit review using our clinic review tool, if applicable. No additional management support is needed unless otherwise documented below in the visit note. 

## 2015-08-20 DIAGNOSIS — C44622 Squamous cell carcinoma of skin of right upper limb, including shoulder: Secondary | ICD-10-CM | POA: Diagnosis not present

## 2015-09-09 ENCOUNTER — Other Ambulatory Visit: Payer: Self-pay

## 2015-09-09 DIAGNOSIS — Z1231 Encounter for screening mammogram for malignant neoplasm of breast: Secondary | ICD-10-CM

## 2015-09-25 ENCOUNTER — Other Ambulatory Visit: Payer: Self-pay | Admitting: Internal Medicine

## 2015-10-07 ENCOUNTER — Other Ambulatory Visit: Payer: Self-pay | Admitting: Internal Medicine

## 2015-10-07 DIAGNOSIS — G43809 Other migraine, not intractable, without status migrainosus: Secondary | ICD-10-CM

## 2015-10-07 NOTE — Telephone Encounter (Signed)
Has not been filled since 2015. Pts last appt was 12/16

## 2015-10-07 NOTE — Telephone Encounter (Signed)
Ok sent!

## 2015-10-14 ENCOUNTER — Ambulatory Visit: Payer: Medicare Other

## 2015-10-15 ENCOUNTER — Emergency Department (HOSPITAL_COMMUNITY)
Admission: EM | Admit: 2015-10-15 | Discharge: 2015-10-15 | Disposition: A | Discharge status: Home / Self Care | Payer: Medicare Other | Source: Home / Self Care | Attending: Emergency Medicine | Admitting: Emergency Medicine

## 2015-10-15 ENCOUNTER — Encounter (HOSPITAL_COMMUNITY): Payer: Self-pay | Admitting: Emergency Medicine

## 2015-10-15 DIAGNOSIS — L255 Unspecified contact dermatitis due to plants, except food: Secondary | ICD-10-CM | POA: Diagnosis not present

## 2015-10-15 MED ORDER — PREDNISONE 20 MG PO TABS: ORAL_TABLET | ORAL | Status: DC

## 2015-10-15 MED ORDER — TRIAMCINOLONE ACETONIDE 0.1 % EX CREA: 1.0000 "application " | TOPICAL_CREAM | Freq: Two times a day (BID) | CUTANEOUS | Status: DC

## 2015-10-15 NOTE — Discharge Instructions (Signed)
Contact Dermatitis Dermatitis is redness, soreness, and swelling (inflammation) of the skin. Contact dermatitis is a reaction to certain substances that touch the skin. There are two types of contact dermatitis:   Irritant contact dermatitis. This type is caused by something that irritates your skin, such as dry hands from washing them too much. This type does not require previous exposure to the substance for a reaction to occur. This type is more common.  Allergic contact dermatitis. This type is caused by a substance that you are allergic to, such as a nickel allergy or poison ivy. This type only occurs if you have been exposed to the substance (allergen) before. Upon a repeat exposure, your body reacts to the substance. This type is less common. CAUSES  Many different substances can cause contact dermatitis. Irritant contact dermatitis is most commonly caused by exposure to:   Makeup.   Soaps.   Detergents.   Bleaches.   Acids.   Metal salts, such as nickel.  Allergic contact dermatitis is most commonly caused by exposure to:   Poisonous plants.   Chemicals.   Jewelry.   Latex.   Medicines.   Preservatives in products, such as clothing.  RISK FACTORS This condition is more likely to develop in:   People who have jobs that expose them to irritants or allergens.  People who have certain medical conditions, such as asthma or eczema.  SYMPTOMS  Symptoms of this condition may occur anywhere on your body where the irritant has touched you or is touched by you. Symptoms include:  Dryness or flaking.   Redness.   Cracks.   Itching.   Pain or a burning feeling.   Blisters.  Drainage of small amounts of blood or clear fluid from skin cracks. With allergic contact dermatitis, there may also be swelling in areas such as the eyelids, mouth, or genitals.  DIAGNOSIS  This condition is diagnosed with a medical history and physical exam. A patch skin test  may be performed to help determine the cause. If the condition is related to your job, you may need to see an occupational medicine specialist. TREATMENT Treatment for this condition includes figuring out what caused the reaction and protecting your skin from further contact. Treatment may also include:   Steroid creams or ointments. Oral steroid medicines may be needed in more severe cases.  Antibiotics or antibacterial ointments, if a skin infection is present.  Antihistamine lotion or an antihistamine taken by mouth to ease itching.  A bandage (dressing). HOME CARE INSTRUCTIONS Skin Care  Moisturize your skin as needed.   Apply cool compresses to the affected areas.  Try taking a bath with:  Epsom salts. Follow the instructions on the packaging. You can get these at your local pharmacy or grocery store.  Baking soda. Pour a small amount into the bath as directed by your health care provider.  Colloidal oatmeal. Follow the instructions on the packaging. You can get this at your local pharmacy or grocery store.  Try applying baking soda paste to your skin. Stir water into baking soda until it reaches a paste-like consistency.  Do not scratch your skin.  Bathe less frequently, such as every other day.  Bathe in lukewarm water. Avoid using hot water. Medicines  Take or apply over-the-counter and prescription medicines only as told by your health care provider.   If you were prescribed an antibiotic medicine, take or apply your antibiotic as told by your health care provider. Do not stop using the  antibiotic even if your condition starts to improve. General Instructions  Keep all follow-up visits as told by your health care provider. This is important.  Avoid the substance that caused your reaction. If you do not know what caused it, keep a journal to try to track what caused it. Write down:  What you eat.  What cosmetic products you use.  What you drink.  What  you wear in the affected area. This includes jewelry.  If you were given a dressing, take care of it as told by your health care provider. This includes when to change and remove it. SEEK MEDICAL CARE IF:   Your condition does not improve with treatment.  Your condition gets worse.  You have signs of infection such as swelling, tenderness, redness, soreness, or warmth in the affected area.  You have a fever.  You have new symptoms. SEEK IMMEDIATE MEDICAL CARE IF:   You have a severe headache, neck pain, or neck stiffness.  You vomit.  You feel very sleepy.  You notice red streaks coming from the affected area.  Your bone or joint underneath the affected area becomes painful after the skin has healed.  The affected area turns darker.  You have difficulty breathing.   This information is not intended to replace advice given to you by your health care provider. Make sure you discuss any questions you have with your health care provider.   Document Released: 07/30/2000 Document Revised: 04/23/2015 Document Reviewed: 12/18/2014 Elsevier Interactive Patient Education 2016 [place] ivy is a rash caused by touching the leaves of the poison ivy plant. The rash often shows up 48 hours later. You might just have bumps, redness, and itching. Sometimes, blisters appear and break open. Your eyes may get puffy (swollen). Poison ivy often heals in 2 to 3 weeks without treatment. HOME CARE  If you touch poison ivy:  Wash your skin with soap and water right away. Wash under your fingernails. Do not rub the skin very hard.  Wash any clothes you were wearing.  Avoid poison ivy in the future. Poison ivy has 3 leaves on a stem.  Use medicine to help with itching as told by your doctor. Do not drive when you take this medicine.  Keep open sores dry, clean, and covered with a bandage and medicated cream, if needed.  Ask your doctor about medicine for children. GET  HELP RIGHT AWAY IF:  You have open sores.  Redness spreads beyond the area of the rash.  There is yellowish white fluid (pus) coming from the rash.  Pain gets worse.  You have a temperature by mouth above 102 F (38.9 C), not controlled by medicine. MAKE SURE YOU:  Understand these instructions.  Will watch your condition.  Will get help right away if you are not doing well or get worse.   This information is not intended to replace advice given to you by your health care provider. Make sure you discuss any questions you have with your health care provider.   Document Released: 09/04/2010 Document Revised: 10/25/2011 Document Reviewed: 01/08/2015 Elsevier Interactive Patient Education [employer].

## 2015-10-15 NOTE — ED Notes (Signed)
Pt has a blistery rash on her right forearm, upper abdomen and on her lower shins bilaterally.  She has had the rash since Saturday.

## 2015-10-15 NOTE — ED Provider Notes (Signed)
CSN: [id number]     Arrival date & time 10/15/15  1309 History   First MD Initiated Contact with Patient 10/15/15 1418     Chief Complaint  Patient presents with  . Poison Ivy   (Consider location/radiation/quality/duration/timing/severity/associated sxs/prior Treatment) HPI Comments: 75 year old female states that she was working in the garden approximate 5 days ago pulling weeds and other plans. The following day she noticed a rash to her hands wrists and forearms. Over the ensuing days she developed a rash to her trunk and lower extremities. It is pruritic. There are various lesions, some papular vesicular and some papular and most of these are erythematous to some extent. She is using calamine lotion and hydrocortisone cream with partial relief. The face is spared.  Patient is a 75 y.o. female presenting with poison ivy.  Poison Ivy    Past Medical History  Diagnosis Date  . MIGRAINE HEADACHE   . DIVERTICULOSIS, COLON   . SPINAL STENOSIS   . PLANTAR FASCIITIS   . NONSPECIFIC ABNORMAL ELECTROCARDIOGRAM 09/15/2010    normal stress test 09/2010  . DEPRESSION   . GERD   . DYSLIPIDEMIA   . HYPERTENSION   . Multinodular goiter    Past Surgical History  Procedure Laterality Date  . Tonsillectomy    . Child birth      x's 2 80 & 68  . Cataract extraction Left 07/05/13    [facility]  . Cataract extraction  winter 2015   Family History  Problem Relation Age of Onset  . Arthritis Mother   . Arthritis Father   . Hyperlipidemia Other     parents  . Heart disease Other     parent, grandparents   Social History  Substance Use Topics  . Smoking status: Never Smoker   . Smokeless tobacco: None     Comment: Married, lives with spouse and 2 sons involved with social activites with spouse at [employer] and church  . Alcohol Use: No   OB History    No data available     Review of Systems  Constitutional: Negative.  Negative for fever.  HENT: Negative.   Respiratory: Negative.    Skin: Positive for rash.  Neurological: Negative.   All other systems reviewed and are negative.   Allergies  Review of patient's allergies indicates no known allergies.  Home Medications   Prior to Admission medications   Medication Sig Start Date End Date Taking? Authorizing Provider  Calcium Citrate-Vitamin D (CITRACAL PETITES/VITAMIN D) 200-250 MG-UNIT TABS Take by mouth 2 (two) times daily.     Yes Historical Provider, MD  lovastatin (MEVACOR) 40 MG tablet TAKE 1 TABLET (40 MG TOTAL) BY MOUTH AT BEDTIME. 08/05/15  Yes [name], MD  omeprazole (PRILOSEC) 20 MG capsule TAKE 1 CAPSULE (20 MG TOTAL) BY MOUTH DAILY. 08/05/15  Yes [name], MD  topiramate (TOPAMAX) 100 MG tablet Take 1 tablet (100 mg total) by mouth daily. 08/05/15  Yes [name], MD  traMADol (ULTRAM) 50 MG tablet Take 1 tablet (50 mg total) by mouth every 8 (eight) hours. 08/05/15  Yes [name], MD  Cholecalciferol (VITAMIN D3) 1000 UNITS CAPS Take by mouth daily.      Historical Provider, MD  gabapentin (NEURONTIN) 300 MG capsule Take 300 mg by mouth daily. Take 2 tablets by mouth daily.    Historical Provider, MD  losartan (COZAAR) 50 MG tablet Take 1 tablet (50 mg total) by mouth daily. 08/05/15   Marzetta Board  Lorretta Harp, MD  Multiple Vitamin (MULTIVITAMIN) tablet Take 1 tablet by mouth daily.      Historical Provider, MD  predniSONE (DELTASONE) 20 MG tablet 3 Tabs PO Days 1-3, then 2 tabs PO Days 4-6, then 1 tab PO Day 7-9, then Half Tab PO Day 10-12 10/15/15   Janne Napoleon, NP  SUMAtriptan (IMITREX) 100 MG tablet TAKE 1 TABLET (100 MG TOTAL) BY MOUTH EVERY 2 (TWO) HOURS AS NEEDED. 10/07/15   [name], MD  traZODone (DESYREL) 50 MG tablet TAKE 1 OR 2 TABLETS BY MOUTH AT BEDTIME AS NEEDED 09/25/15   [name], MD  triamcinolone cream (KENALOG) 0.1 % Apply 1 application topically 2 (two) times daily. 10/15/15   Janne Napoleon, NP  valACYclovir (VALTREX) 1000 MG tablet TAKE 2 TABLETS BY MOUTH TWICE A DAY AS SOON AS  SYMPTOMS START 07/14/15   Historical Provider, MD   Meds Ordered and Administered this Visit  Medications - No data to display  BP 151/77 mmHg  Pulse 72  Temp(Src) 97.4 F (36.3 C) (Oral)  Resp 18  SpO2 98% No data found.   Physical Exam  Constitutional: She appears well-developed and well-nourished. No distress.  Eyes: EOM are normal.  Neck: Normal range of motion. Neck supple.  Cardiovascular: Normal rate and regular rhythm.   Pulmonary/Chest: Effort normal. No respiratory distress.  Neurological: She is alert. She exhibits normal muscle tone.  Skin: Skin is warm and dry. Rash noted.  As per history of present illness there are several areas of a papular vesicular type rash as well as red papules scattered about the extremities and the trunk. The face is spared. There is calamine lotion on some of the lesions. So far no lesions are draining. No bleeding. No signs of infection.  Psychiatric: She has a normal mood and affect.  Nursing note and vitals reviewed.   ED Course  Procedures (including critical care time)  Labs Review Labs Reviewed - No data to display  Imaging Review No results found.   Visual Acuity Review  Right Eye Distance:   Left Eye Distance:   Bilateral Distance:    Right Eye Near:   Left Eye Near:    Bilateral Near:         MDM   1. Contact dermatitis due to plant    Meds ordered this encounter  Medications  . triamcinolone cream (KENALOG) 0.1 %    Sig: Apply 1 application topically 2 (two) times daily.    Dispense:  30 g    Refill:  0    Order Specific Question:  Supervising Provider    Answer:  Billy Fischer [phone]  . predniSONE (DELTASONE) 20 MG tablet    Sig: 3 Tabs PO Days 1-3, then 2 tabs PO Days 4-6, then 1 tab PO Day 7-9, then Half Tab PO Day 10-12    Dispense:  20 tablet    Refill:  0    Order Specific Question:  Supervising Provider    Answer:  Billy Fischer [id number]   Continue calamine lotion    Janne Napoleon,  NP 10/15/15 1458

## 2015-11-15 ENCOUNTER — Other Ambulatory Visit: Payer: Self-pay | Admitting: Internal Medicine

## 2015-11-19 ENCOUNTER — Ambulatory Visit: Payer: Medicare Other

## 2015-12-04 ENCOUNTER — Other Ambulatory Visit: Payer: Self-pay | Admitting: Emergency Medicine

## 2015-12-04 MED ORDER — TRAMADOL HCL 50 MG PO TABS: 50.0000 mg | ORAL_TABLET | Freq: Three times a day (TID) | ORAL | Status: DC

## 2015-12-04 MED ORDER — LOVASTATIN 40 MG PO TABS: ORAL_TABLET | ORAL | Status: DC

## 2015-12-04 MED ORDER — LOSARTAN POTASSIUM 50 MG PO TABS: 50.0000 mg | ORAL_TABLET | Freq: Every day | ORAL | Status: DC

## 2015-12-04 MED ORDER — TOPIRAMATE 100 MG PO TABS: 100.0000 mg | ORAL_TABLET | Freq: Every day | ORAL | Status: DC

## 2015-12-04 MED ORDER — OMEPRAZOLE 20 MG PO CPDR: DELAYED_RELEASE_CAPSULE | ORAL | Status: DC

## 2015-12-04 NOTE — Telephone Encounter (Signed)
Please advise on the Refill for Tramadol, last refill 08/05/15 #270 with 1 refill. Okay to send to mail order?

## 2015-12-05 NOTE — Telephone Encounter (Signed)
RX faxed to mail order 

## 2015-12-23 ENCOUNTER — Other Ambulatory Visit: Payer: Self-pay

## 2015-12-23 MED ORDER — SUMATRIPTAN SUCCINATE 100 MG PO TABS: ORAL_TABLET | ORAL | Status: DC

## 2015-12-30 ENCOUNTER — Ambulatory Visit
Admission: RE | Admit: 2015-12-30 | Discharge: 2015-12-30 | Disposition: A | Discharge status: Home / Self Care | Payer: Medicare Other | Source: Ambulatory Visit

## 2015-12-30 DIAGNOSIS — Z1231 Encounter for screening mammogram for malignant neoplasm of breast: Secondary | ICD-10-CM

## 2016-02-03 ENCOUNTER — Encounter: Payer: Self-pay | Admitting: Gastroenterology

## 2016-02-24 ENCOUNTER — Telehealth: Payer: Self-pay | Admitting: *Deleted

## 2016-02-24 MED ORDER — TRAMADOL HCL 50 MG PO TABS: 50.0000 mg | ORAL_TABLET | Freq: Three times a day (TID) | ORAL | Status: DC

## 2016-02-24 NOTE — Telephone Encounter (Signed)
printed

## 2016-02-24 NOTE — Telephone Encounter (Signed)
Receive fax pt requesting regill on her Tramadol 90 day supply need rx fax to [facility]...Johny Chess

## 2016-02-25 NOTE — Telephone Encounter (Signed)
Faxed script back to [facility]...Johny Chess

## 2016-05-13 DIAGNOSIS — M542 Cervicalgia: Secondary | ICD-10-CM | POA: Diagnosis not present

## 2016-05-13 DIAGNOSIS — M5126 Other intervertebral disc displacement, lumbar region: Secondary | ICD-10-CM | POA: Diagnosis not present

## 2016-05-13 DIAGNOSIS — M545 Low back pain: Secondary | ICD-10-CM | POA: Diagnosis not present

## 2016-05-13 DIAGNOSIS — M4722 Other spondylosis with radiculopathy, cervical region: Secondary | ICD-10-CM | POA: Diagnosis not present

## 2016-05-13 DIAGNOSIS — M47816 Spondylosis without myelopathy or radiculopathy, lumbar region: Secondary | ICD-10-CM | POA: Diagnosis not present

## 2016-05-15 ENCOUNTER — Other Ambulatory Visit: Payer: Self-pay | Admitting: Emergency Medicine

## 2016-05-15 MED ORDER — TRAMADOL HCL 50 MG PO TABS
50.0000 mg | ORAL_TABLET | Freq: Three times a day (TID) | ORAL | 0 refills | Status: DC
Start: 2016-05-15 — End: 2016-08-06

## 2016-05-17 ENCOUNTER — Other Ambulatory Visit: Payer: Self-pay | Admitting: Rehabilitation

## 2016-05-17 DIAGNOSIS — M542 Cervicalgia: Secondary | ICD-10-CM

## 2016-05-17 NOTE — Telephone Encounter (Signed)
RX faxed to [facility] 

## 2016-05-28 ENCOUNTER — Ambulatory Visit
Admission: RE | Admit: 2016-05-28 | Discharge: 2016-05-28 | Disposition: A | Discharge status: Home / Self Care | Payer: Medicare Other | Source: Ambulatory Visit | Attending: Rehabilitation | Admitting: Rehabilitation

## 2016-05-28 DIAGNOSIS — M542 Cervicalgia: Secondary | ICD-10-CM

## 2016-05-28 DIAGNOSIS — M4802 Spinal stenosis, cervical region: Secondary | ICD-10-CM | POA: Diagnosis not present

## 2016-06-01 DIAGNOSIS — M47816 Spondylosis without myelopathy or radiculopathy, lumbar region: Secondary | ICD-10-CM | POA: Diagnosis not present

## 2016-06-14 DIAGNOSIS — M47816 Spondylosis without myelopathy or radiculopathy, lumbar region: Secondary | ICD-10-CM | POA: Diagnosis not present

## 2016-07-01 ENCOUNTER — Ambulatory Visit (INDEPENDENT_AMBULATORY_CARE_PROVIDER_SITE_OTHER): Payer: Medicare Other

## 2016-07-01 DIAGNOSIS — Z23 Encounter for immunization: Secondary | ICD-10-CM

## 2016-08-05 NOTE — Progress Notes (Signed)
Subjective:    Patient ID: [name], female    DOB: Sep 28, 1940, 75 y.o.   MRN: [id number]  HPI Here for medicare wellness exam an annual physical exam.   She has felt a little depressed recently.  Her husband has nit picking more and has been more difficult. She sometimes just goes into another room and cries and feels better.  She did bring it up to him and he does not realize what he is doing.  He has been this way for a long time, but is worse.   Back pain: she still has some back pain.  She has had some injections, but they did not help.  She takes gabapentin.  She also takes two tramadol in the morning and one in the afternoon.  The tramadol helps.  I have personally reviewed and have noted 1.The patient's medical and social history 2.Their use of alcohol, tobacco or illicit drugs 3.Their current medications and supplements 4.The patient's functional ability including ADL's, fall risks, home safety                 risks and hearing or visual impairment. 5.Diet and physical activities 6.Evidence for depression or mood disorders 7.Care team reviewed - orthopedics, eye - Dr Bing Plume   Are there smokers in your home (other than you)? No  Risk Factors Exercise: walking with dog in morning, sometimes in afternoon Dietary issues discussed: tries to eat low sugar, some fried foods - mostly grilled food  Cardiac risk factors: advanced age, hypertension, hyperlipidemia  Depression Screen  Have you felt down, depressed or hopeless? Yes, mild due to relationship with husband - mild, controlled  Have you felt little interest or pleasure in doing things?  No  Activities of Daily Living In your present state of health, do you have any difficulty performing the following activities?:  Driving? No Managing money?  No Feeding yourself? No Getting from bed to chair? No Climbing a flight of stairs? No Preparing food and  eating?: No Bathing or showering? No Getting dressed: No Getting to/using the toilet? No Moving around from place to place: No In the past year have you fallen or had a near fall?: No   Are you sexually active?  No  Do you have more than one partner?  N/A  Hearing Difficulties:  Yes - turning tv up Do you often ask people to speak up or repeat themselves? No Do you experience ringing or noises in your ears? No Do you have difficulty understanding soft or whispered voices? yes Vision:              Any change in vision:  no              Up to date with eye exam: yes  Memory:  Do you feel that you have a problem with memory? No, occasionally                 mild forgetful  Do you often misplace items? No  Do you feel safe at home?  Yes  Cognitive Testing  Alert, Orientated? Yes  Normal Appearance? Yes  Recall of three objects?  Yes  Can perform simple calculations? Yes  Displays appropriate judgment? Yes  Can read the correct time from a watch face? Yes   Advanced Directives have been discussed with the patient? Yes - discussed   Medications and allergies reviewed with patient and updated if appropriate.  Patient Active Problem List   Diagnosis  Date Noted  . Squamous cell cancer of skin of hand 08/05/2015  . Difficulty sleeping 08/05/2015  . Breast pain, right 08/05/2015  . Abdominal pain 08/05/2015  . NONSPECIFIC ABNORMAL ELECTROCARDIOGRAM 09/15/2010  . GERD 01/09/2010  . DIVERTICULOSIS, COLON 01/09/2010  . Spinal stenosis of lumbar region 01/09/2010  . PLANTAR FASCIITIS 01/09/2010  . Hyperlipidemia 12/29/2009  . DEPRESSION 12/29/2009  . Migraine headache 12/29/2009  . Essential hypertension 12/29/2009    Current Outpatient Prescriptions on File Prior to Visit  Medication Sig Dispense Refill  . Calcium Citrate-Vitamin D (CITRACAL PETITES/VITAMIN D) 200-250 MG-UNIT TABS Take by mouth 2 (two) times daily.      . Cholecalciferol (VITAMIN D3) 1000 UNITS CAPS Take by  mouth daily.      Marland Kitchen gabapentin (NEURONTIN) 300 MG capsule Take 300 mg by mouth daily. Take 2 tablets by mouth daily.    Marland Kitchen losartan (COZAAR) 50 MG tablet Take 1 tablet (50 mg total) by mouth daily. 90 tablet 3  . lovastatin (MEVACOR) 40 MG tablet TAKE 1 TABLET (40 MG TOTAL) BY MOUTH AT BEDTIME. 90 tablet 3  . Multiple Vitamin (MULTIVITAMIN) tablet Take 1 tablet by mouth daily.      Marland Kitchen omeprazole (PRILOSEC) 20 MG capsule TAKE 1 CAPSULE (20 MG TOTAL) BY MOUTH DAILY. 90 capsule 3  . SUMAtriptan (IMITREX) 100 MG tablet TAKE 1 TABLET (100 MG TOTAL) BY MOUTH EVERY 2 (TWO) HOURS AS NEEDED. 10 tablet 3  . topiramate (TOPAMAX) 100 MG tablet Take 1 tablet (100 mg total) by mouth daily. 90 tablet 3  . traZODone (DESYREL) 50 MG tablet TAKE 1 OR 2 TABLETS BY MOUTH AT BEDTIME AS NEEDED 60 tablet 0  . valACYclovir (VALTREX) 1000 MG tablet TAKE 2 TABLETS BY MOUTH TWICE A DAY AS SOON AS SYMPTOMS START  2   No current facility-administered medications on file prior to visit.     Past Medical History:  Diagnosis Date  . DEPRESSION   . DIVERTICULOSIS, COLON   . DYSLIPIDEMIA   . GERD   . HYPERTENSION   . MIGRAINE HEADACHE   . Multinodular goiter   . NONSPECIFIC ABNORMAL ELECTROCARDIOGRAM 09/15/2010   normal stress test 09/2010  . PLANTAR FASCIITIS   . SPINAL STENOSIS     Past Surgical History:  Procedure Laterality Date  . CATARACT EXTRACTION Left 07/05/13   [facility]  . CATARACT EXTRACTION  winter 2015  . child birth     x's 2 15 & 41  . TONSILLECTOMY      Social History   Social History  . Marital status: Married    Spouse name: N/A  . Number of children: N/A  . Years of education: N/A   Social History Main Topics  . Smoking status: Never Smoker  . Smokeless tobacco: None     Comment: Married, lives with spouse and 2 sons involved with social activites with spouse at [employer] and church  . Alcohol use No  . Drug use: No  . Sexual activity: Not Asked   Other Topics Concern  . None    Social History Narrative   Exercise; walking dog only    Family History  Problem Relation Age of Onset  . Arthritis Mother   . Arthritis Father   . Hyperlipidemia Other     parents  . Heart disease Other     parent, grandparents    Review of Systems  Constitutional: Negative for chills and fever.  HENT: Positive for hearing loss (mild). Negative for tinnitus.  Eyes: Negative for visual disturbance.  Respiratory: Negative for cough, shortness of breath and wheezing.   Cardiovascular: Positive for chest pain (rare, odd pain that is transient, had it several times). Negative for palpitations and leg swelling.  Gastrointestinal: Negative for abdominal pain, blood in stool, constipation and diarrhea.  Genitourinary: Negative for dysuria and hematuria.  Musculoskeletal: Positive for back pain.  Skin: Negative for color change and rash.  Neurological: Negative for light-headedness and headaches.  Psychiatric/Behavioral: Positive for dysphoric mood (mild, controlled). The patient is nervous/anxious (mild).        Objective:   Vitals:   08/06/16 1049  BP: (!) 150/80  Pulse: 75  Resp: 16  Temp: 97.6 F (36.4 C)   Filed Weights   08/06/16 1049  Weight: 156 lb (70.8 kg)   Body mass index is 27.63 kg/m.   Physical Exam Constitutional: She appears well-developed and well-nourished. No distress.  HENT:  Head: Normocephalic and atraumatic.  Right Ear: External ear normal. Normal ear canal and TM Left Ear: External ear normal.  Normal ear canal and TM Mouth/Throat: Oropharynx is clear and moist.  Eyes: Conjunctivae and EOM are normal.  Neck: Neck supple. No tracheal deviation present. No thyromegaly present.  No carotid bruit  Cardiovascular: Normal rate, regular rhythm and normal heart sounds.   No murmur heard.  No edema. Pulmonary/Chest: Effort normal and breath sounds normal. No respiratory distress. She has no wheezes. She has no rales.  Breast: deferred to  Gyn Abdominal: Soft. She exhibits no distension. There is no tenderness.  Lymphadenopathy: She has no cervical adenopathy.  Skin: Skin is warm and dry. She is not diaphoretic.  Psychiatric: She has a normal mood and affect. Her behavior is normal.         Assessment & Plan:   Wellness Exam: Immunizations  Up to date  Colonoscopy  Due  - last done 2007 Mammogram  Up to date  Dexa  Done 2013 - normal, will repeat  Eye exam     yes Hearing loss  Mild, not bothersome enough to have it evaluated Memory concerns/difficulties Independent of ADLs  Fully  Stressed the importance of regular exercise   Patient received copy of preventative screening tests/immunizations recommended for the next 5-10 years.   Physical exam: Screening blood work ordered Immunizations  Up to date  Colonoscopy  Due  - last done 2007 Mammogram  Up to date  Dexa  Done 2013 - normal, will repeat  Gyn  - no longer seeing Eye exams  Up to date  Exercise - yes, walking dog -- stressed to continue and increase if possible Weight - will work on weight loss Skin - will schedule with derm Substance abuse  -none  See Problem List for Assessment and Plan of chronic medical problems.   F/u in 6 months, sooner if needed

## 2016-08-06 ENCOUNTER — Encounter: Payer: Self-pay | Admitting: Internal Medicine

## 2016-08-06 ENCOUNTER — Other Ambulatory Visit (INDEPENDENT_AMBULATORY_CARE_PROVIDER_SITE_OTHER): Payer: Medicare Other

## 2016-08-06 ENCOUNTER — Ambulatory Visit (INDEPENDENT_AMBULATORY_CARE_PROVIDER_SITE_OTHER): Payer: Medicare Other | Admitting: Internal Medicine

## 2016-08-06 VITALS — BP 150/80 | HR 75 | Temp 97.6°F | Resp 16 | Wt 156.0 lb

## 2016-08-06 DIAGNOSIS — E7800 Pure hypercholesterolemia, unspecified: Secondary | ICD-10-CM | POA: Diagnosis not present

## 2016-08-06 DIAGNOSIS — K219 Gastro-esophageal reflux disease without esophagitis: Secondary | ICD-10-CM

## 2016-08-06 DIAGNOSIS — R739 Hyperglycemia, unspecified: Secondary | ICD-10-CM | POA: Diagnosis not present

## 2016-08-06 DIAGNOSIS — Z0000 Encounter for general adult medical examination without abnormal findings: Secondary | ICD-10-CM

## 2016-08-06 DIAGNOSIS — F32A Depression, unspecified: Secondary | ICD-10-CM

## 2016-08-06 DIAGNOSIS — M48061 Spinal stenosis, lumbar region without neurogenic claudication: Secondary | ICD-10-CM

## 2016-08-06 DIAGNOSIS — I10 Essential (primary) hypertension: Secondary | ICD-10-CM

## 2016-08-06 DIAGNOSIS — G43809 Other migraine, not intractable, without status migrainosus: Secondary | ICD-10-CM

## 2016-08-06 DIAGNOSIS — C44622 Squamous cell carcinoma of skin of right upper limb, including shoulder: Secondary | ICD-10-CM

## 2016-08-06 DIAGNOSIS — G479 Sleep disorder, unspecified: Secondary | ICD-10-CM | POA: Diagnosis not present

## 2016-08-06 DIAGNOSIS — F329 Major depressive disorder, single episode, unspecified: Secondary | ICD-10-CM

## 2016-08-06 LAB — COMPREHENSIVE METABOLIC PANEL
ALT: 26 U/L (ref 0–35)
AST: 25 U/L (ref 0–37)
Albumin: 4.8 g/dL (ref 3.5–5.2)
Alkaline Phosphatase: 100 U/L (ref 39–117)
BUN: 19 mg/dL (ref 6–23)
CHLORIDE: 108 meq/L (ref 96–112)
CO2: 27 meq/L (ref 19–32)
Calcium: 9.6 mg/dL (ref 8.4–10.5)
Creatinine, Ser: 0.91 mg/dL (ref 0.40–1.20)
GFR: 64.15 mL/min (ref >60.00)
Glucose, Bld: 101 mg/dL — ABNORMAL HIGH (ref 70–99)
Potassium: 4.3 mEq/L (ref 3.5–5.1)
SODIUM: 143 meq/L (ref 135–145)
Total Bilirubin: 0.4 mg/dL (ref 0.2–1.2)
Total Protein: 7.8 g/dL (ref 6.0–8.3)

## 2016-08-06 LAB — CBC WITH DIFFERENTIAL/PLATELET
BASOS PCT: 0.5 % (ref 0.0–3.0)
Basophils Absolute: 0 10*3/uL (ref 0.0–0.1)
EOS ABS: 0.2 10*3/uL (ref 0.0–0.7)
Eosinophils Relative: 2.7 % (ref 0.0–5.0)
HCT: 42.5 % (ref 36.0–46.0)
Hemoglobin: 14.7 g/dL (ref 12.0–15.0)
LYMPHS ABS: 2.7 10*3/uL (ref 0.7–4.0)
Lymphocytes Relative: 30.5 % (ref 12.0–46.0)
MCHC: 34.6 g/dL (ref 30.0–36.0)
MCV: 87.8 fl (ref 78.0–100.0)
MONO ABS: 0.4 10*3/uL (ref 0.1–1.0)
Monocytes Relative: 4.2 % (ref 3.0–12.0)
NEUTROS ABS: 5.6 10*3/uL (ref 1.4–7.7)
NEUTROS PCT: 62.1 % (ref 43.0–77.0)
PLATELETS: 382 10*3/uL (ref 150.0–400.0)
RBC: 4.84 Mil/uL (ref 3.87–5.11)
RDW: 13.4 % (ref 11.5–15.5)
WBC: 9 10*3/uL (ref 4.0–10.5)

## 2016-08-06 LAB — HEMOGLOBIN A1C: HEMOGLOBIN A1C: 5.5 % (ref 4.6–6.5)

## 2016-08-06 LAB — LIPID PANEL
CHOL/HDL RATIO: 5
Cholesterol: 254 mg/dL — ABNORMAL HIGH (ref 0–200)
HDL: 46.3 mg/dL (ref >39.00)
LDL CALC: 175 mg/dL — AB (ref 0–99)
NonHDL: 207.5
Triglycerides: 161 mg/dL — ABNORMAL HIGH (ref 0.0–149.0)
VLDL: 32.2 mg/dL (ref 0.0–40.0)

## 2016-08-06 LAB — TSH: TSH: 1.13 u[IU]/mL (ref 0.35–4.50)

## 2016-08-06 MED ORDER — TRAMADOL HCL 50 MG PO TABS: ORAL_TABLET | ORAL | 0 refills | Status: DC

## 2016-08-06 NOTE — Assessment & Plan Note (Signed)
Follows with orthopedics Takes gabapentin twice daily Taking tramadol 2 tabs in am and 1 tab in PM Taking tramadol as prescribed - will continue current dose Continue regular walking

## 2016-08-06 NOTE — Patient Instructions (Signed)
[name] , Thank you for taking time to come for your Medicare Wellness Visit. I appreciate your ongoing commitment to your health goals. Please review the following plan we discussed and let me know if I can assist you in the future.   These are the goals we discussed: Goals    Monitor your blood pressure and if it is elevated return for a visit      This is a list of the screening recommended for you and due dates:  Health Maintenance  Topic Date Due  . Colon Cancer Screening  01/15/2016  . Mammogram  12/29/2017  . Tetanus Vaccine  03/13/2020  . Flu Shot  Addressed  . DEXA scan (bone density measurement)  Completed  . Shingles Vaccine  Completed  . Pneumonia vaccines  Completed     Test(s) ordered today. Your results will be released to [facility] (or called to you) after review, usually within 72hours after test completion. If any changes need to be made, you will be notified at that same time.  All other Health Maintenance issues reviewed.   All recommended immunizations and age-appropriate screenings are up-to-date or discussed.  No immunizations administered today.   Medications reviewed and updated.  No changes recommended at this time.   Please followup in 6 months   Health Maintenance, Female Introduction Adopting a healthy lifestyle and getting preventive care can go a long way to promote health and wellness. Talk with your health care provider about what schedule of regular examinations is right for you. This is a good chance for you to check in with your provider about disease prevention and staying healthy. In between checkups, there are plenty of things you can do on your own. Experts have done a lot of research about which lifestyle changes and preventive measures are most likely to keep you healthy. Ask your health care provider for more information. Weight and diet Eat a healthy diet  Be sure to include plenty of vegetables, fruits, low-fat dairy products,  and lean protein.  Do not eat a lot of foods high in solid fats, added sugars, or salt.  Get regular exercise. This is one of the most important things you can do for your health.  Most adults should exercise for at least 150 minutes each week. The exercise should increase your heart rate and make you sweat (moderate-intensity exercise).  Most adults should also do strengthening exercises at least twice a week. This is in addition to the moderate-intensity exercise. Maintain a healthy weight  Body mass index (BMI) is a measurement that can be used to identify possible weight problems. It estimates body fat based on height and weight. Your health care provider can help determine your BMI and help you achieve or maintain a healthy weight.  For females 52 years of age and older:  A BMI below 18.5 is considered underweight.  A BMI of 18.5 to 24.9 is normal.  A BMI of 25 to 29.9 is considered overweight.  A BMI of 30 and above is considered obese. Watch levels of cholesterol and blood lipids  You should start having your blood tested for lipids and cholesterol at 74 years of age, then have this test every 5 years.  You may need to have your cholesterol levels checked more often if:  Your lipid or cholesterol levels are high.  You are older than 74 years of age.  You are at high risk for heart disease. Cancer screening Lung Cancer  Lung cancer screening  is recommended for adults 49-52 years old who are at high risk for lung cancer because of a history of smoking.  A yearly low-dose CT scan of the lungs is recommended for people who:  Currently smoke.  Have quit within the past 15 years.  Have at least a 30-pack-year history of smoking. A pack year is smoking an average of one pack of cigarettes a day for 1 year.  Yearly screening should continue until it has been 15 years since you quit.  Yearly screening should stop if you develop a health problem that would prevent you  from having lung cancer treatment. Breast Cancer  Practice breast self-awareness. This means understanding how your breasts normally appear and feel.  It also means doing regular breast self-exams. Let your health care provider know about any changes, no matter how small.  If you are in your 20s or 30s, you should have a clinical breast exam (CBE) by a health care provider every 1-3 years as part of a regular health exam.  If you are 44 or older, have a CBE every year. Also consider having a breast X-ray (mammogram) every year.  If you have a family history of breast cancer, talk to your health care provider about genetic screening.  If you are at high risk for breast cancer, talk to your health care provider about having an MRI and a mammogram every year.  Breast cancer gene (BRCA) assessment is recommended for women who have family members with BRCA-related cancers. BRCA-related cancers include:  Breast.  Ovarian.  Tubal.  Peritoneal cancers.  Results of the assessment will determine the need for genetic counseling and BRCA1 and BRCA2 testing. Cervical Cancer  Your health care provider may recommend that you be screened regularly for cancer of the pelvic organs (ovaries, uterus, and vagina). This screening involves a pelvic examination, including checking for microscopic changes to the surface of your cervix (Pap test). You may be encouraged to have this screening done every 3 years, beginning at age 72.  For women ages 59-65, health care providers may recommend pelvic exams and Pap testing every 3 years, or they may recommend the Pap and pelvic exam, combined with testing for human papilloma virus (HPV), every 5 years. Some types of HPV increase your risk of cervical cancer. Testing for HPV may also be done on women of any age with unclear Pap test results.  Other health care providers may not recommend any screening for nonpregnant women who are considered low risk for pelvic  cancer and who do not have symptoms. Ask your health care provider if a screening pelvic exam is right for you.  If you have had past treatment for cervical cancer or a condition that could lead to cancer, you need Pap tests and screening for cancer for at least 20 years after your treatment. If Pap tests have been discontinued, your risk factors (such as having a new sexual partner) need to be reassessed to determine if screening should resume. Some women have medical problems that increase the chance of getting cervical cancer. In these cases, your health care provider may recommend more frequent screening and Pap tests. Colorectal Cancer  This type of cancer can be detected and often prevented.  Routine colorectal cancer screening usually begins at 74 years of age and continues through 74 years of age.  Your health care provider may recommend screening at an earlier age if you have risk factors for colon cancer.  Your health care provider may also  recommend using home test kits to check for hidden blood in the stool.  A small camera at the end of a tube can be used to examine your colon directly (sigmoidoscopy or colonoscopy). This is done to check for the earliest forms of colorectal cancer.  Routine screening usually begins at age 7.  Direct examination of the colon should be repeated every 5-10 years through 74 years of age. However, you may need to be screened more often if early forms of precancerous polyps or small growths are found. Skin Cancer  Check your skin from head to toe regularly.  Tell your health care provider about any new moles or changes in moles, especially if there is a change in a mole's shape or color.  Also tell your health care provider if you have a mole that is larger than the size of a pencil eraser.  Always use sunscreen. Apply sunscreen liberally and repeatedly throughout the day.  Protect yourself by wearing long sleeves, pants, a wide-brimmed hat, and  sunglasses whenever you are outside. Heart disease, diabetes, and high blood pressure  High blood pressure causes heart disease and increases the risk of stroke. High blood pressure is more likely to develop in:  People who have blood pressure in the high end of the normal range (130-139/85-89 mm Hg).  People who are overweight or obese.  People who are African American.  If you are 68-56 years of age, have your blood pressure checked every 3-5 years. If you are 7 years of age or older, have your blood pressure checked every year. You should have your blood pressure measured twice-once when you are at a hospital or clinic, and once when you are not at a hospital or clinic. Record the average of the two measurements. To check your blood pressure when you are not at a hospital or clinic, you can use:  An automated blood pressure machine at a pharmacy.  A home blood pressure monitor.  If you are between 50 years and 74 years old, ask your health care provider if you should take aspirin to prevent strokes.  Have regular diabetes screenings. This involves taking a blood sample to check your fasting blood sugar level.  If you are at a normal weight and have a low risk for diabetes, have this test once every three years after 74 years of age.  If you are overweight and have a high risk for diabetes, consider being tested at a younger age or more often. Preventing infection Hepatitis B  If you have a higher risk for hepatitis B, you should be screened for this virus. You are considered at high risk for hepatitis B if:  You were born in a country where hepatitis B is common. Ask your health care provider which countries are considered high risk.  Your parents were born in a high-risk country, and you have not been immunized against hepatitis B (hepatitis B vaccine).  You have HIV or AIDS.  You use needles to inject street drugs.  You live with someone who has hepatitis B.  You have had  sex with someone who has hepatitis B.  You get hemodialysis treatment.  You take certain medicines for conditions, including cancer, organ transplantation, and autoimmune conditions. Hepatitis C  Blood testing is recommended for:  Everyone born from [birth year] through 1964.  Anyone with known risk factors for hepatitis C. Sexually transmitted infections (STIs)  You should be screened for sexually transmitted infections (STIs) including gonorrhea and chlamydia if:  You are sexually active and are younger than 74 years of age.  You are older than 74 years of age and your health care provider tells you that you are at risk for this type of infection.  Your sexual activity has changed since you were last screened and you are at an increased risk for chlamydia or gonorrhea. Ask your health care provider if you are at risk.  If you do not have HIV, but are at risk, it may be recommended that you take a prescription medicine daily to prevent HIV infection. This is called pre-exposure prophylaxis (PrEP). You are considered at risk if:  You are sexually active and do not regularly use condoms or know the HIV status of your partner(s).  You take drugs by injection.  You are sexually active with a partner who has HIV. Talk with your health care provider about whether you are at high risk of being infected with HIV. If you choose to begin PrEP, you should first be tested for HIV. You should then be tested every 3 months for as long as you are taking PrEP. Pregnancy  If you are premenopausal and you may become pregnant, ask your health care provider about preconception counseling.  If you may become pregnant, take 400 to 800 micrograms (mcg) of folic acid every day.  If you want to prevent pregnancy, talk to your health care provider about birth control (contraception). Osteoporosis and menopause  Osteoporosis is a disease in which the bones lose minerals and strength with aging. This can result  in serious bone fractures. Your risk for osteoporosis can be identified using a bone density scan.  If you are 61 years of age or older, or if you are at risk for osteoporosis and fractures, ask your health care provider if you should be screened.  Ask your health care provider whether you should take a calcium or vitamin D supplement to lower your risk for osteoporosis.  Menopause may have certain physical symptoms and risks.  Hormone replacement therapy may reduce some of these symptoms and risks. Talk to your health care provider about whether hormone replacement therapy is right for you. Follow these instructions at home:  Schedule regular health, dental, and eye exams.  Stay current with your immunizations.  Do not use any tobacco products including cigarettes, chewing tobacco, or electronic cigarettes.  If you are pregnant, do not drink alcohol.  If you are breastfeeding, limit how much and how often you drink alcohol.  Limit alcohol intake to no more than 1 drink per day for nonpregnant women. One drink equals 12 ounces of beer, 5 ounces of wine, or 1 ounces of hard liquor.  Do not use street drugs.  Do not share needles.  Ask your health care provider for help if you need support or information about quitting drugs.  Tell your health care provider if you often feel depressed.  Tell your health care provider if you have ever been abused or do not feel safe at home. This information is not intended to replace advice given to you by your health care provider. Make sure you discuss any questions you have with your health care provider. Document Released: 02/15/2011 Document Revised: 01/08/2016 Document Reviewed: 05/06/2015  2017 Elsevier

## 2016-08-06 NOTE — Assessment & Plan Note (Signed)
GERD controlled Continue daily medication  

## 2016-08-06 NOTE — Assessment & Plan Note (Signed)
Check lipid panel  Continue daily statin Regular exercise and healthy diet encouraged  

## 2016-08-06 NOTE — Progress Notes (Signed)
Pre visit review using our clinic review tool, if applicable. No additional management support is needed unless otherwise documented below in the visit note. 

## 2016-08-06 NOTE — Assessment & Plan Note (Signed)
Elevated here today - feels it is related to frustration at home Start monitoring at home Low sodium diet F/u in 6 months, sooner if BP elevated at home cmp
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
## 2016-08-06 NOTE — Assessment & Plan Note (Addendum)
No headache in one year Taking 100 mg topamax nightly - may consider taking 50 mg to see if she still needs it -taper slowly and call with questions imitrex as needed

## 2016-08-06 NOTE — Assessment & Plan Note (Signed)
Goes to sleep well, wakes up 4-5 hrs later, then can sometimes not go back to seelp Takes the trazodone occasionally

## 2016-08-06 NOTE — Assessment & Plan Note (Signed)
Sees dermatology - saw her one year ago  Will schedule

## 2016-08-10 ENCOUNTER — Other Ambulatory Visit: Payer: Self-pay | Admitting: Internal Medicine

## 2016-08-10 MED ORDER — ATORVASTATIN CALCIUM 20 MG PO TABS: 20.0000 mg | ORAL_TABLET | Freq: Every day | ORAL | 3 refills | Status: DC

## 2016-10-14 ENCOUNTER — Telehealth: Payer: Self-pay | Admitting: *Deleted

## 2016-10-14 NOTE — Telephone Encounter (Signed)
Rec'd fax pt requesting 90 day script on her Ultram. Med is not on med list pls advise...Johny Chess

## 2016-10-15 MED ORDER — TRAMADOL HCL 50 MG PO TABS: ORAL_TABLET | ORAL | 0 refills | Status: DC

## 2016-10-15 NOTE — Telephone Encounter (Signed)
Called refill into [facility] spoke w/pharmacist gave MD approval.../lmb

## 2016-10-15 NOTE — Telephone Encounter (Signed)
[place] controlled substance database checked.  Ok to fill medication --Tramadol.

## 2016-11-05 ENCOUNTER — Encounter (HOSPITAL_COMMUNITY): Payer: Self-pay

## 2016-11-05 ENCOUNTER — Ambulatory Visit (HOSPITAL_COMMUNITY)
Admission: EM | Admit: 2016-11-05 | Discharge: 2016-11-05 | Disposition: A | Discharge status: Home / Self Care | Payer: Medicare Other | Attending: Internal Medicine | Admitting: Internal Medicine

## 2016-11-05 DIAGNOSIS — B029 Zoster without complications: Secondary | ICD-10-CM | POA: Diagnosis not present

## 2016-11-05 MED ORDER — VALACYCLOVIR HCL 1 G PO TABS: 1000.0000 mg | ORAL_TABLET | Freq: Three times a day (TID) | ORAL | 0 refills | Status: DC

## 2016-11-05 MED ORDER — HYDROCODONE-ACETAMINOPHEN 5-325 MG PO TABS: 1.0000 | ORAL_TABLET | ORAL | 0 refills | Status: DC | PRN

## 2016-11-05 NOTE — Discharge Instructions (Signed)
You are given a prescription for shingles. It is an anti-viral medication. You are also given a prescription for pain medicine. He may want to hold this until you have more severe pain. If you have tramadol at home he may try to take that first the do not take them together. It may cause drowsiness. If this rash goes to your face and particularly close to the eyes you must see your primary care doctor promptly. It can be very dangerous  if the rash gets close to the eye.

## 2016-11-05 NOTE — ED Provider Notes (Signed)
CSN: [id number]     Arrival date & time 11/05/16  1608 History   First MD Initiated Contact with Patient 11/05/16 1636     Chief Complaint  Patient presents with  . Rash   (Consider location/radiation/quality/duration/timing/severity/associated sxs/prior Treatment) 76 year old female presents with a rash on the right scalp. It started approximately 3 days ago with soreness to the scalp. The following day she was able to palpate small bumps in the area. She states today she has noticed some tender areas to the occiput.      Past Medical History:  Diagnosis Date  . DEPRESSION   . DIVERTICULOSIS, COLON   . DYSLIPIDEMIA   . GERD   . HYPERTENSION   . MIGRAINE HEADACHE   . Multinodular goiter   . NONSPECIFIC ABNORMAL ELECTROCARDIOGRAM 09/15/2010   normal stress test 09/2010  . PLANTAR FASCIITIS   . SPINAL STENOSIS    Past Surgical History:  Procedure Laterality Date  . CATARACT EXTRACTION Left 07/05/13   [facility]  . CATARACT EXTRACTION  winter 2015  . child birth     x's 2 73 & 45  . TONSILLECTOMY     Family History  Problem Relation Age of Onset  . Arthritis Mother   . Arthritis Father   . Hyperlipidemia Other     parents  . Heart disease Other     parent, grandparents   Social History  Substance Use Topics  . Smoking status: Never Smoker  . Smokeless tobacco: Never Used     Comment: Married, lives with spouse and 2 sons involved with social activites with spouse at [employer] and church  . Alcohol use No   OB History    No data available     Review of Systems  Constitutional: Negative.   HENT: Negative.        No facial involvement of the rash.  Eyes: Negative.   Gastrointestinal: Negative.   Musculoskeletal: Negative.   Skin: Positive for rash.  Neurological: Negative.   All other systems reviewed and are negative.   Allergies  Patient has no known allergies.  Home Medications   Prior to Admission medications   Medication Sig Start Date End Date  Taking? Authorizing Provider  atorvastatin (LIPITOR) 20 MG tablet Take 1 tablet (20 mg total) by mouth daily. 08/10/16  Yes [name], MD  Calcium Citrate-Vitamin D (CITRACAL PETITES/VITAMIN D) 200-250 MG-UNIT TABS Take by mouth 2 (two) times daily.     Yes Historical Provider, MD  Cholecalciferol (VITAMIN D3) 1000 UNITS CAPS Take by mouth daily.     Yes Historical Provider, MD  gabapentin (NEURONTIN) 300 MG capsule Take 300 mg by mouth daily. Take 2 tablets by mouth daily.   Yes Historical Provider, MD  losartan (COZAAR) 50 MG tablet Take 1 tablet (50 mg total) by mouth daily. 12/04/15  Yes [name], MD  lovastatin (MEVACOR) 40 MG tablet TAKE 1 TABLET (40 MG TOTAL) BY MOUTH AT BEDTIME. 12/04/15  Yes [name], MD  Multiple Vitamin (MULTIVITAMIN) tablet Take 1 tablet by mouth daily.     Yes Historical Provider, MD  omeprazole (PRILOSEC) 20 MG capsule TAKE 1 CAPSULE (20 MG TOTAL) BY MOUTH DAILY. 12/04/15  Yes [name], MD  SUMAtriptan (IMITREX) 100 MG tablet TAKE 1 TABLET (100 MG TOTAL) BY MOUTH EVERY 2 (TWO) HOURS AS NEEDED. 12/23/15  Yes [name], MD  topiramate (TOPAMAX) 100 MG tablet Take 1 tablet (100 mg total) by mouth daily. 12/04/15  Yes [name], MD  traMADol Veatrice Bourbon) 50 MG tablet Take two tabs in the morning and one tab in the afternoon as needed 10/15/16  Yes [name], MD  HYDROcodone-acetaminophen (NORCO/VICODIN) 5-325 MG tablet Take 1 tablet by mouth every 4 (four) hours as needed. 11/05/16   Janne Napoleon, NP  traZODone (DESYREL) 50 MG tablet TAKE 1 OR 2 TABLETS BY MOUTH AT BEDTIME AS NEEDED 11/17/15   [name], MD  valACYclovir (VALTREX) 1000 MG tablet Take 1 tablet (1,000 mg total) by mouth 3 (three) times daily. 11/05/16   Janne Napoleon, NP   Meds Ordered and Administered this Visit  Medications - No data to display  BP 106/62 (BP Location: Left Arm)   Temp 98.1 F (36.7 C) (Oral)   Resp 20   SpO2 97%  No data found.   Physical Exam  Constitutional: She  is oriented to person, place, and time. She appears well-developed and well-nourished. No distress.  HENT:  Head: Normocephalic and atraumatic.  Right Ear: External ear normal.  Left Ear: External ear normal.  Nose: Nose normal.  Eyes: EOM are normal.  No peri- Orbital color changes, rash or swelling.  Cardiovascular: Normal rate.   Pulmonary/Chest: Effort normal.  Musculoskeletal: Normal range of motion. She exhibits no edema.  Neurological: She is alert and oriented to person, place, and time. No cranial nerve deficit.  Skin: Skin is warm and dry. Rash noted.  There are small vesicular like lesions overlying a red base to the right parietal scalp that is beginning to track posteriorly toward the right occipital area. These lesions are tender. C2,C3 dermatone.  There is also tenderness to a right posterior cervical muscle. There are no lesions or discolorations. This is a separate condition.  Psychiatric: She has a normal mood and affect. Her behavior is normal.  Nursing note and vitals reviewed.   Urgent Care Course     Procedures (including critical care time)  Labs Review Labs Reviewed - No data to display  Imaging Review No results found.   Visual Acuity Review  Right Eye Distance:   Left Eye Distance:   Bilateral Distance:    Right Eye Near:   Left Eye Near:    Bilateral Near:         MDM   1. Herpes zoster without complication    You are given a prescription for shingles. It is an anti-viral medication. You are also given a prescription for pain medicine. He may want to hold this until you have more severe pain. If you have tramadol at home he may try to take that first the do not take them together. It may cause drowsiness. If this rash goes to your face and particularly close to the eyes you must see your primary care doctor promptly. It can be very dangerous  if the rash gets close to the eye. Meds ordered this encounter  Medications  . valACYclovir  (VALTREX) 1000 MG tablet    Sig: Take 1 tablet (1,000 mg total) by mouth 3 (three) times daily.    Dispense:  21 tablet    Refill:  0    Order Specific Question:   Supervising Provider    Answer:   Sherlene Shams [[id number]]  . HYDROcodone-acetaminophen (NORCO/VICODIN) 5-325 MG tablet    Sig: Take 1 tablet by mouth every 4 (four) hours as needed.    Dispense:  20 tablet    Refill:  0    Order Specific Question:  Supervising Provider    Answer:   Sherlene Shams [[id number]]      Janne Napoleon, NP 11/05/16 1658    Janne Napoleon, NP 11/05/16 [phone]

## 2016-11-05 NOTE — ED Triage Notes (Signed)
Pt noticed rash and bumps on her scalp since Tuesday and is getting worse. Also is spreading down her head. Also causing tightness in the right side of her neck.

## 2016-11-29 ENCOUNTER — Other Ambulatory Visit: Payer: Self-pay | Admitting: Internal Medicine

## 2016-11-29 DIAGNOSIS — Z1231 Encounter for screening mammogram for malignant neoplasm of breast: Secondary | ICD-10-CM

## 2016-12-08 ENCOUNTER — Other Ambulatory Visit: Payer: Self-pay | Admitting: Internal Medicine

## 2016-12-30 ENCOUNTER — Ambulatory Visit
Admission: RE | Admit: 2016-12-30 | Discharge: 2016-12-30 | Disposition: A | Discharge status: Home / Self Care | Payer: Medicare Other | Source: Ambulatory Visit | Attending: Internal Medicine | Admitting: Internal Medicine

## 2016-12-30 DIAGNOSIS — Z1231 Encounter for screening mammogram for malignant neoplasm of breast: Secondary | ICD-10-CM

## 2017-02-08 ENCOUNTER — Ambulatory Visit (INDEPENDENT_AMBULATORY_CARE_PROVIDER_SITE_OTHER): Payer: Medicare Other | Admitting: Internal Medicine

## 2017-02-08 ENCOUNTER — Encounter: Payer: Self-pay | Admitting: Internal Medicine

## 2017-02-08 ENCOUNTER — Other Ambulatory Visit (INDEPENDENT_AMBULATORY_CARE_PROVIDER_SITE_OTHER): Payer: Medicare Other

## 2017-02-08 VITALS — BP 162/94 | HR 76 | Temp 97.8°F | Resp 16 | Ht 63.0 in | Wt 161.0 lb

## 2017-02-08 DIAGNOSIS — K219 Gastro-esophageal reflux disease without esophagitis: Secondary | ICD-10-CM

## 2017-02-08 DIAGNOSIS — I10 Essential (primary) hypertension: Secondary | ICD-10-CM | POA: Diagnosis not present

## 2017-02-08 DIAGNOSIS — E7800 Pure hypercholesterolemia, unspecified: Secondary | ICD-10-CM

## 2017-02-08 DIAGNOSIS — H9191 Unspecified hearing loss, right ear: Secondary | ICD-10-CM | POA: Insufficient documentation

## 2017-02-08 DIAGNOSIS — G479 Sleep disorder, unspecified: Secondary | ICD-10-CM

## 2017-02-08 DIAGNOSIS — G43809 Other migraine, not intractable, without status migrainosus: Secondary | ICD-10-CM | POA: Diagnosis not present

## 2017-02-08 DIAGNOSIS — M48061 Spinal stenosis, lumbar region without neurogenic claudication: Secondary | ICD-10-CM

## 2017-02-08 LAB — COMPREHENSIVE METABOLIC PANEL
ALBUMIN: 4.5 g/dL (ref 3.5–5.2)
ALK PHOS: 102 U/L (ref 39–117)
ALT: 25 U/L (ref 0–35)
AST: 21 U/L (ref 0–37)
BILIRUBIN TOTAL: 0.5 mg/dL (ref 0.2–1.2)
BUN: 18 mg/dL (ref 6–23)
CALCIUM: 9.8 mg/dL (ref 8.4–10.5)
CO2: 27 mEq/L (ref 19–32)
Chloride: 106 mEq/L (ref 96–112)
Creatinine, Ser: 0.95 mg/dL (ref 0.40–1.20)
GFR: 60.96 mL/min (ref >60.00)
GLUCOSE: 99 mg/dL (ref 70–99)
Potassium: 4.4 mEq/L (ref 3.5–5.1)
Sodium: 140 mEq/L (ref 135–145)
TOTAL PROTEIN: 7.1 g/dL (ref 6.0–8.3)

## 2017-02-08 LAB — LIPID PANEL
CHOLESTEROL: 211 mg/dL — AB (ref 0–200)
HDL: 40.9 mg/dL (ref >39.00)
NonHDL: 169.95
Total CHOL/HDL Ratio: 5
Triglycerides: 233 mg/dL — ABNORMAL HIGH (ref 0.0–149.0)
VLDL: 46.6 mg/dL — ABNORMAL HIGH (ref 0.0–40.0)

## 2017-02-08 LAB — LDL CHOLESTEROL, DIRECT: LDL DIRECT: 120 mg/dL

## 2017-02-08 MED ORDER — LOSARTAN POTASSIUM 50 MG PO TABS: 50.0000 mg | ORAL_TABLET | Freq: Every day | ORAL | 3 refills | Status: DC

## 2017-02-08 MED ORDER — RANITIDINE HCL 150 MG PO TABS: 150.0000 mg | ORAL_TABLET | Freq: Every day | ORAL | 3 refills | Status: DC

## 2017-02-08 NOTE — Assessment & Plan Note (Addendum)
GERD controlled Continue daily medication - try other natural supplements  Zantac sent to pharmacy, stop omeprazole Call with questions

## 2017-02-08 NOTE — Assessment & Plan Note (Addendum)
Taking topamax daily Has not had any migraines  Has imitrex if needed Continue above

## 2017-02-08 NOTE — Assessment & Plan Note (Addendum)
Pain relatively controlled Continue gabapentin twice daily Continue tramadol 2 tablets in the morning, 1 in the evening if needed Walking regularly for exercise Continue current medications

## 2017-02-08 NOTE — Assessment & Plan Note (Signed)
Taking trazodone as needed only - has not taken it in a while Ok to use as needed

## 2017-02-08 NOTE — Assessment & Plan Note (Addendum)
BP Readings from Last 3 Encounters:  02/08/17 (!) 162/94  11/05/16 106/62  08/06/16 (!) 150/80   Elevated here today Should monitor at home cmp

## 2017-02-08 NOTE — Assessment & Plan Note (Signed)
Check lipid panel  Continue daily statin Regular exercise and healthy diet encouraged  

## 2017-02-08 NOTE — Assessment & Plan Note (Signed)
Exam normal ? Decreased hearing Will refer to audiology May need to see ENT

## 2017-02-08 NOTE — Progress Notes (Signed)
Subjective:    Patient ID: [name], female    DOB: Jan 21, 1941, 76 y.o.   MRN: [id number]  HPI She is here for follow up.  Hypertension: She is taking her medication daily. She is compliant with a low sodium diet.  She has occasional palpitations. She denies chest pain, edema, shortness of breath and regular headaches. She is exercising regularly - some walking.  She does not monitor her blood pressure at home.    Hyperlipidemia: She is taking her medication daily. She is compliant with a low fat/cholesterol diet. She is exercising regularly -some walking. She denies myalgias.   GERD:  She is taking her medication daily as prescribed.  She denies any GERD symptoms and feels her GERD is well controlled.   Migraine headaches: She is taking Topamax nightly. She uses Imitrex only as needed, but has not needed them because she has not had any recent migraines.  Spinal stenosis of lumbar region, chronic back pain: She does follow with orthopedics, but has not needed to see them recently. She takes tramadol daily and gabapentin twice daily.  She feels her pain is fairly controled - the medication takes the edge off.    Sleep difficulties: She takes trazodone as needed.  She has not taken it in a while.   Decreased hearing:  Her right ear has decreased hearing since her cold a few months ago.  She denies ear pain or pressure.  She denies other cold symptoms or fever.    Medications and allergies reviewed with patient and updated if appropriate.  Patient Active Problem List   Diagnosis Date Noted  . Squamous cell cancer of skin of hand 08/05/2015  . Difficulty sleeping 08/05/2015  . Abdominal pain 08/05/2015  . NONSPECIFIC ABNORMAL ELECTROCARDIOGRAM 09/15/2010  . GERD 01/09/2010  . DIVERTICULOSIS, COLON 01/09/2010  . Spinal stenosis of lumbar region 01/09/2010  . PLANTAR FASCIITIS 01/09/2010  . Hyperlipidemia 12/29/2009  . Depression 12/29/2009  . Migraine headache 12/29/2009  .  Essential hypertension 12/29/2009    Current Outpatient Prescriptions on File Prior to Visit  Medication Sig Dispense Refill  . atorvastatin (LIPITOR) 20 MG tablet Take 1 tablet (20 mg total) by mouth daily. 90 tablet 3  . Calcium Citrate-Vitamin D (CITRACAL PETITES/VITAMIN D) 200-250 MG-UNIT TABS Take by mouth 2 (two) times daily.      . Cholecalciferol (VITAMIN D3) 1000 UNITS CAPS Take by mouth daily.      Marland Kitchen gabapentin (NEURONTIN) 300 MG capsule Take 300 mg by mouth daily. Take 2 tablets by mouth daily.    . Multiple Vitamin (MULTIVITAMIN) tablet Take 1 tablet by mouth daily.      . SUMAtriptan (IMITREX) 100 MG tablet TAKE 1 TABLET (100 MG TOTAL) BY MOUTH EVERY 2 (TWO) HOURS AS NEEDED. 10 tablet 3  . topiramate (TOPAMAX) 100 MG tablet TAKE 1 TABLET BY MOUTH  DAILY 90 tablet 0  . traMADol (ULTRAM) 50 MG tablet Take two tabs in the morning and one tab in the afternoon as needed 270 tablet 0  . traZODone (DESYREL) 50 MG tablet TAKE 1 OR 2 TABLETS BY MOUTH AT BEDTIME AS NEEDED 60 tablet 0   No current facility-administered medications on file prior to visit.     Past Medical History:  Diagnosis Date  . DEPRESSION   . DIVERTICULOSIS, COLON   . DYSLIPIDEMIA   . GERD   . HYPERTENSION   . MIGRAINE HEADACHE   . Multinodular goiter   . NONSPECIFIC ABNORMAL ELECTROCARDIOGRAM  09/15/2010   normal stress test 09/2010  . PLANTAR FASCIITIS   . SPINAL STENOSIS     Past Surgical History:  Procedure Laterality Date  . CATARACT EXTRACTION Left 07/05/13   [facility]  . CATARACT EXTRACTION  winter 2015  . child birth     x's 2 8 & 46  . TONSILLECTOMY      Social History   Social History  . Marital status: Married    Spouse name: N/A  . Number of children: N/A  . Years of education: N/A   Social History Main Topics  . Smoking status: Never Smoker  . Smokeless tobacco: Never Used     Comment: Married, lives with spouse and 2 sons involved with social activites with spouse at [employer]  and church  . Alcohol use No  . Drug use: No  . Sexual activity: Not on file   Other Topics Concern  . Not on file   Social History Narrative   Exercise; walking dog only    Family History  Problem Relation Age of Onset  . Arthritis Mother   . Arthritis Father   . Hyperlipidemia Other        parents  . Heart disease Other        parent, grandparents  . Breast cancer Maternal Aunt     Review of Systems  Constitutional: Negative for chills and fever.  HENT: Positive for hearing loss (right ear). Negative for congestion, ear pain, sinus pain and sinus pressure.   Respiratory: Negative for cough, shortness of breath and wheezing.   Cardiovascular: Positive for palpitations (occ, transient). Negative for chest pain and leg swelling.  Neurological: Positive for headaches (occasional). Negative for dizziness and light-headedness.       Objective:   Vitals:   02/08/17 0948  BP: (!) 162/94  Pulse: 76  Resp: 16  Temp: 97.8 F (36.6 C)   Filed Weights   02/08/17 0948  Weight: 161 lb (73 kg)   Body mass index is 28.52 kg/m.  Wt Readings from Last 3 Encounters:  02/08/17 161 lb (73 kg)  08/06/16 156 lb (70.8 kg)  08/05/15 166 lb (75.3 kg)     Physical Exam GENERAL APPEARANCE: Appears stated age, well appearing, NAD EYES: conjunctiva clear, no icterus HEENT: bilateral tympanic membranes and ear canals normal, oropharynx with no erythema, no thyromegaly, trachea midline, no cervical or supraclavicular lymphadenopathy LUNGS: Clear to auscultation without wheeze or crackles, unlabored breathing, good air entry bilaterally HEART: Normal S1,S2 without murmurs EXTREMITIES: Without clubbing, cyanosis, or edema      Assessment & Plan:   See Problem List for Assessment and Plan of chronic medical problems.

## 2017-02-08 NOTE — Patient Instructions (Addendum)
  Test(s) ordered today. Your results will be released to [place] (or called to you) after review, usually within 72hours after test completion. If any changes need to be made, you will be notified at that same time.  Medications reviewed and updated.  Changes includes stopping omeprazole and starting zantac at bedtime.    Your prescription(s) have been submitted to your pharmacy. Please take as directed and contact our office if you believe you are having problem(s) with the medication(s).  A referral was ordered for Audiology.  Please followup in 6 months

## 2017-02-15 ENCOUNTER — Other Ambulatory Visit: Payer: Self-pay | Admitting: Emergency Medicine

## 2017-02-15 MED ORDER — TRAMADOL HCL 50 MG PO TABS
ORAL_TABLET | ORAL | 0 refills | Status: DC
Start: 2017-02-15 — End: 2017-06-08

## 2017-02-15 NOTE — Telephone Encounter (Signed)
RX faxed to [facility].  

## 2017-02-15 NOTE — Telephone Encounter (Signed)
[place] Controlled Substance Database checked. Okay to fill RX. Last filled on 10/15/16 for 90 day supply

## 2017-04-02 ENCOUNTER — Other Ambulatory Visit: Payer: Self-pay | Admitting: Internal Medicine

## 2017-06-08 ENCOUNTER — Telehealth: Payer: Self-pay | Admitting: Emergency Medicine

## 2017-06-08 MED ORDER — TRAMADOL HCL 50 MG PO TABS: ORAL_TABLET | ORAL | 0 refills | Status: DC

## 2017-06-08 NOTE — Telephone Encounter (Signed)
Refill request from [facility] for Tramadol. Per chart last fill 02/15/17 #90, not in controlled database. Please advise.

## 2017-06-08 NOTE — Telephone Encounter (Signed)
Ok to fill 

## 2017-06-09 NOTE — Telephone Encounter (Signed)
RX faxed to [facility].  

## 2017-06-22 ENCOUNTER — Ambulatory Visit (INDEPENDENT_AMBULATORY_CARE_PROVIDER_SITE_OTHER): Payer: Medicare Other

## 2017-06-22 DIAGNOSIS — Z23 Encounter for immunization: Secondary | ICD-10-CM | POA: Diagnosis not present

## 2017-06-23 ENCOUNTER — Other Ambulatory Visit: Payer: Self-pay | Admitting: Internal Medicine

## 2017-07-31 NOTE — Progress Notes (Signed)
Subjective:    Patient ID: [name], female    DOB: 18-Apr-1941, 76 y.o.   MRN: [id number]  HPI The patient is here for follow up.  She has had a scratchy throat the past few days.  She has some PND and nasal congestion at night.  This morning she coughed up some phlegm, but she denies cough during the day.  She denies any wheeze, shortness of breath, fever or chills.  She is not experience any sinus pain or ear pain.  Hypertension: She is taking her medication daily. She is compliant with a low sodium diet.  She denies chest pain, palpitations, edema, shortness of breath and regular headaches. She is exercising regularly.      Hyperlipidemia: She is taking her medication daily. She is compliant with a low fat/cholesterol diet. She is exercising regularly. She denies myalgias.   Migraines: she denies any recent migraines.    She feels her migraines are controlled and she is happy with her current medication.  GERD:  She is taking her medication daily as prescribed.  She denies any GERD symptoms and feels her GERD is well controlled.  The Zantac does not work as good as the omeprazole, but she knows she needs to do better with avoiding certain foods.  Spinal stenosis of lumbar region:  She is taking gabapentin and tramadol.   This does help her pain.  Her back specialist was prescribing the gabapentin, but she would prefer to have me prescribe it so she does not have to go back to see him.  She feels the combination of both medications was working fairly well.  Several months of left medial calf pain - localized.  No bruise. Nothing makes it worse.  No swelling.  She does not have any pain with walking.  She wondered if it was a possible varicose vein.  The pain does not bother her enough to have that evaluated further.  Insomnia;  She takes trazodone as needed.  She does not take it on a nightly basis.  Upper inner thigh skin changes.  The skin in her upper thighs bilaterally in the  medial aspect of her legs has become darker.  She is unsure how long this was there-possibly a few months.  She denies any itching, pain or rash.  She is wearing different pants around the house.  She is unsure if there is any friction.   Medications and allergies reviewed with patient and updated if appropriate.  Patient Active Problem List   Diagnosis Date Noted  . 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 Squamous cell cancer of skin of hand 08/05/2015  . Difficulty sleeping 08/05/2015  . Abdominal pain 08/05/2015  . NONSPECIFIC ABNORMAL ELECTROCARDIOGRAM 09/15/2010  . GERD 01/09/2010  . DIVERTICULOSIS, COLON 01/09/2010  . Spinal stenosis of lumbar region 01/09/2010  . PLANTAR FASCIITIS 01/09/2010  . Hyperlipidemia 12/29/2009  . Depression 12/29/2009  . Migraine headache 12/29/2009  . Essential hypertension 12/29/2009    Current Outpatient Medications on File Prior to Visit  Medication Sig Dispense Refill  . atorvastatin (LIPITOR) 20 MG tablet Take 1 tablet (20 mg total) by mouth daily. 90 tablet 3  . Calcium Citrate-Vitamin D (CITRACAL PETITES/VITAMIN D) 200-250 MG-UNIT TABS Take by mouth 2 (two) times daily.      . Cholecalciferol (VITAMIN D3) 1000 UNITS CAPS Take by mouth daily.      . Coenzyme Q10 (CO Q 10 PO) Take by mouth daily.    Marland Kitchen  gabapentin (NEURONTIN) 300 MG capsule Take 300 mg by mouth daily. Take 2 tablets by mouth daily.    Marland Kitchen losartan (COZAAR) 50 MG tablet Take 1 tablet (50 mg total) by mouth daily. 90 tablet 3  . Multiple Vitamin (MULTIVITAMIN) tablet Take 1 tablet by mouth daily.      . ranitidine (ZANTAC) 150 MG tablet Take 1 tablet (150 mg total) by mouth at bedtime. 90 tablet 3  . SUMAtriptan (IMITREX) 100 MG tablet TAKE 1 TABLET (100 MG TOTAL) BY MOUTH EVERY 2 (TWO) HOURS AS NEEDED. 10 tablet 3  . topiramate (TOPAMAX) 100 MG tablet TAKE 1 TABLET BY MOUTH  DAILY 90 tablet 0  . traMADol (ULTRAM) 50 MG tablet Take two tabs in the morning and one tab in the  afternoon as needed 270 tablet 0  . traZODone (DESYREL) 50 MG tablet TAKE 1 OR 2 TABLETS BY MOUTH AT BEDTIME AS NEEDED 60 tablet 0   No current facility-administered medications on file prior to visit.     Past Medical History:  Diagnosis Date  . DEPRESSION   . DIVERTICULOSIS, COLON   . DYSLIPIDEMIA   . GERD   . HYPERTENSION   . MIGRAINE HEADACHE   . Multinodular goiter   . NONSPECIFIC ABNORMAL ELECTROCARDIOGRAM 09/15/2010   normal stress test 09/2010  . PLANTAR FASCIITIS   . SPINAL STENOSIS     Past Surgical History:  Procedure Laterality Date  . CATARACT EXTRACTION Left 07/05/13   [facility]  . CATARACT EXTRACTION  winter 2015  . child birth     x's 2 79 & 73  . TONSILLECTOMY      Social History   Socioeconomic History  . Marital status: Married    Spouse name: None  . Number of children: None  . Years of education: None  . Highest education level: None  Social Needs  . Financial resource strain: None  . Food insecurity - worry: None  . Food insecurity - inability: None  . Transportation needs - medical: None  . Transportation needs - non-medical: None  Occupational History  . None  Tobacco Use  . Smoking status: Never Smoker  . Smokeless tobacco: Never Used  . Tobacco comment: Married, lives with spouse and 2 sons involved with social activites with spouse at [employer] and church  Substance and Sexual Activity  . Alcohol use: No  . Drug use: No  . Sexual activity: None  Other Topics Concern  . None  Social History Narrative   Exercise; walking dog only    Family History  Problem Relation Age of Onset  . Arthritis Mother   . Arthritis Father   . Hyperlipidemia Other        parents  . Heart disease Other        parent, grandparents  . Breast cancer Maternal Aunt     Review of Systems  Constitutional: Negative for chills and fever.  HENT: Positive for congestion (mild) and postnasal drip. Negative for ear pain, sinus pressure, sneezing and sore  throat (scratchy throat ).   Respiratory: Negative for cough, shortness of breath and wheezing.   Cardiovascular: Negative for chest pain, palpitations and leg swelling.  Neurological: Negative for dizziness, light-headedness and headaches.       Objective:   Vitals:   08/02/17 1126  BP: (!) 148/80  Pulse: 85  Resp: 16  Temp: 98.6 F (37 C)  SpO2: 98%   Wt Readings from Last 3 Encounters:  08/02/17 156 lb (70.8  kg)  02/08/17 161 lb (73 kg)  08/06/16 156 lb (70.8 kg)   Body mass index is 27.63 kg/m.   Physical Exam    Constitutional: Appears well-developed and well-nourished. No distress.  HENT:  Head: Normocephalic and atraumatic.  Neck: Neck supple. No tracheal deviation present. No thyromegaly present.  No cervical lymphadenopathy.  Bilateral ear canals and tympanic membranes normal.  No oropharynx erythema. Cardiovascular: Normal rate, regular rhythm and normal heart sounds.   No murmur heard. No carotid bruit .  No edema Pulmonary/Chest: Effort normal and breath sounds normal. No respiratory distress. No has no wheezes. No rales.  Skin: Skin is warm and dry. Not diaphoretic.  Psychiatric: Normal mood and affect. Behavior is normal.      Assessment & Plan:    See Problem List for Assessment and Plan of chronic medical problems.

## 2017-07-31 NOTE — Patient Instructions (Addendum)

## 2017-08-02 ENCOUNTER — Encounter: Payer: Self-pay | Admitting: Internal Medicine

## 2017-08-02 ENCOUNTER — Other Ambulatory Visit (INDEPENDENT_AMBULATORY_CARE_PROVIDER_SITE_OTHER): Payer: Medicare Other

## 2017-08-02 ENCOUNTER — Ambulatory Visit (INDEPENDENT_AMBULATORY_CARE_PROVIDER_SITE_OTHER): Payer: Medicare Other | Admitting: Internal Medicine

## 2017-08-02 VITALS — BP 148/80 | HR 85 | Temp 98.6°F | Resp 16 | Wt 156.0 lb

## 2017-08-02 DIAGNOSIS — E7849 Other hyperlipidemia: Secondary | ICD-10-CM

## 2017-08-02 DIAGNOSIS — G479 Sleep disorder, unspecified: Secondary | ICD-10-CM

## 2017-08-02 DIAGNOSIS — K219 Gastro-esophageal reflux disease without esophagitis: Secondary | ICD-10-CM | POA: Diagnosis not present

## 2017-08-02 DIAGNOSIS — I10 Essential (primary) hypertension: Secondary | ICD-10-CM | POA: Diagnosis not present

## 2017-08-02 DIAGNOSIS — G43809 Other migraine, not intractable, without status migrainosus: Secondary | ICD-10-CM

## 2017-08-02 DIAGNOSIS — R0981 Nasal congestion: Secondary | ICD-10-CM

## 2017-08-02 DIAGNOSIS — M48061 Spinal stenosis, lumbar region without neurogenic claudication: Secondary | ICD-10-CM | POA: Diagnosis not present

## 2017-08-02 LAB — LIPID PANEL
CHOL/HDL RATIO: 4
Cholesterol: 169 mg/dL (ref 0–200)
HDL: 41.6 mg/dL (ref >39.00)
NONHDL: 127.1
Triglycerides: 203 mg/dL — ABNORMAL HIGH (ref 0.0–149.0)
VLDL: 40.6 mg/dL — ABNORMAL HIGH (ref 0.0–40.0)

## 2017-08-02 LAB — CBC WITH DIFFERENTIAL/PLATELET
BASOS PCT: 0.5 % (ref 0.0–3.0)
Basophils Absolute: 0 10*3/uL (ref 0.0–0.1)
EOS PCT: 2.3 % (ref 0.0–5.0)
Eosinophils Absolute: 0.2 10*3/uL (ref 0.0–0.7)
HCT: 43.9 % (ref 36.0–46.0)
Hemoglobin: 14.6 g/dL (ref 12.0–15.0)
LYMPHS ABS: 2.6 10*3/uL (ref 0.7–4.0)
Lymphocytes Relative: 27.7 % (ref 12.0–46.0)
MCHC: 33.2 g/dL (ref 30.0–36.0)
MCV: 91.2 fl (ref 78.0–100.0)
MONOS PCT: 6.1 % (ref 3.0–12.0)
Monocytes Absolute: 0.6 10*3/uL (ref 0.1–1.0)
NEUTROS PCT: 63.4 % (ref 43.0–77.0)
Neutro Abs: 5.9 10*3/uL (ref 1.4–7.7)
Platelets: 304 10*3/uL (ref 150.0–400.0)
RBC: 4.81 Mil/uL (ref 3.87–5.11)
RDW: 13.4 % (ref 11.5–15.5)
WBC: 9.3 10*3/uL (ref 4.0–10.5)

## 2017-08-02 LAB — COMPREHENSIVE METABOLIC PANEL
ALT: 22 U/L (ref 0–35)
AST: 20 U/L (ref 0–37)
Albumin: 4.6 g/dL (ref 3.5–5.2)
Alkaline Phosphatase: 99 U/L (ref 39–117)
BUN: 15 mg/dL (ref 6–23)
CHLORIDE: 104 meq/L (ref 96–112)
CO2: 27 meq/L (ref 19–32)
Calcium: 9.4 mg/dL (ref 8.4–10.5)
Creatinine, Ser: 0.9 mg/dL (ref 0.40–1.20)
GFR: 64.8 mL/min (ref >60.00)
GLUCOSE: 93 mg/dL (ref 70–99)
POTASSIUM: 3.7 meq/L (ref 3.5–5.1)
SODIUM: 140 meq/L (ref 135–145)
Total Bilirubin: 0.6 mg/dL (ref 0.2–1.2)
Total Protein: 7.7 g/dL (ref 6.0–8.3)

## 2017-08-02 LAB — LDL CHOLESTEROL, DIRECT: Direct LDL: 113 mg/dL

## 2017-08-02 MED ORDER — GABAPENTIN 300 MG PO CAPS: 300.0000 mg | ORAL_CAPSULE | Freq: Every day | ORAL | 3 refills | Status: DC

## 2017-08-02 MED ORDER — ATORVASTATIN CALCIUM 20 MG PO TABS: 20.0000 mg | ORAL_TABLET | Freq: Every day | ORAL | 3 refills | Status: DC

## 2017-08-02 MED ORDER — TOPIRAMATE 100 MG PO TABS: 100.0000 mg | ORAL_TABLET | Freq: Every day | ORAL | 3 refills | Status: DC

## 2017-08-02 MED ORDER — TRAMADOL HCL 50 MG PO TABS: ORAL_TABLET | ORAL | 0 refills | Status: DC

## 2017-08-02 NOTE — Assessment & Plan Note (Signed)
Taking trazodone only as needed Continue as needed only

## 2017-08-02 NOTE — Assessment & Plan Note (Signed)
BP Readings from Last 3 Encounters:  08/02/17 (!) 148/80  02/08/17 (!) 162/94  11/05/16 106/62   Blood pressure minimally elevated here today We will continue to monitor on current medication Check CMP

## 2017-08-02 NOTE — Assessment & Plan Note (Signed)
Mild nasal congestion and PND Likely viral in nature Symptomatic treatment Call if no improvement

## 2017-08-02 NOTE — Assessment & Plan Note (Signed)
GERD fairly controlled, but she is still having some symptoms Her symptoms tend to occur when she eats food she should not Discussed that we can increase the Zantac to twice daily Stressed compliance with her diet

## 2017-08-02 NOTE — Assessment & Plan Note (Signed)
Check lipid panel  Continue daily statin Regular exercise and healthy diet encouraged  

## 2017-08-02 NOTE — Assessment & Plan Note (Signed)
Overall migraines controlled Continue Topamax once daily Imitrex as needed, which she has not needed recently

## 2017-08-02 NOTE — Assessment & Plan Note (Signed)
Chronic pain, fairly controlled with gabapentin and tramadol Will continue regular exercise Continue current medications Both medications refilled today

## 2017-10-19 ENCOUNTER — Other Ambulatory Visit: Payer: Self-pay | Admitting: Internal Medicine

## 2017-10-19 MED ORDER — TRAMADOL HCL 50 MG PO TABS: ORAL_TABLET | ORAL | 0 refills | Status: DC

## 2017-10-19 NOTE — Telephone Encounter (Signed)
[place] Controlled Substance Database checked. Last filled on 06/09/17 

## 2017-10-19 NOTE — Telephone Encounter (Signed)
Copied from [place] [id number]. Topic: Quick Communication - See Telephone Encounter >> Oct 19, 2017 11:38 AM Percell Belt A wrote: CRM for notification. See Telephone encounter for: pt called in and said that they could only fill a week of her  traMADol (ULTRAM) 50 MG tablet [[id number] and would need a PA for the rest   [facility] [address]   10/19/17.

## 2017-12-01 ENCOUNTER — Other Ambulatory Visit: Payer: Self-pay | Admitting: Internal Medicine

## 2017-12-01 DIAGNOSIS — Z139 Encounter for screening, unspecified: Secondary | ICD-10-CM

## 2018-01-02 ENCOUNTER — Ambulatory Visit
Admission: RE | Admit: 2018-01-02 | Discharge: 2018-01-02 | Disposition: A | Discharge status: Home / Self Care | Payer: Medicare Other | Source: Ambulatory Visit | Attending: Internal Medicine | Admitting: Internal Medicine

## 2018-01-02 DIAGNOSIS — Z139 Encounter for screening, unspecified: Secondary | ICD-10-CM

## 2018-01-30 NOTE — Progress Notes (Signed)
Subjective:    Patient ID: [name], female    DOB: 1941/05/29, 77 y.o.   MRN: [id number]  HPI The patient is here for follow up.  Patient Active Problem List   Diagnosis Date Noted  . Nasal congestion 08/02/2017  . 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 Squamous cell cancer of skin of hand 08/05/2015  . Difficulty sleeping 08/05/2015  . NONSPECIFIC ABNORMAL ELECTROCARDIOGRAM 09/15/2010  . GERD 01/09/2010  . DIVERTICULOSIS, COLON 01/09/2010  . Spinal stenosis of lumbar region 01/09/2010  . PLANTAR FASCIITIS 01/09/2010  . Hyperlipidemia 12/29/2009  . Depression 12/29/2009  . Migraine headache 12/29/2009  . Essential hypertension 12/29/2009    Current Outpatient Medications on File Prior to Visit  Medication Sig Dispense Refill  . atorvastatin (LIPITOR) 20 MG tablet Take 1 tablet (20 mg total) by mouth daily. 90 tablet 3  . Calcium Citrate-Vitamin D (CITRACAL PETITES/VITAMIN D) 200-250 MG-UNIT TABS Take by mouth 2 (two) times daily.      . Cholecalciferol (VITAMIN D3) 1000 UNITS CAPS Take by mouth daily.      . Coenzyme Q10 (CO Q 10 PO) Take by mouth daily.    Marland Kitchen gabapentin (NEURONTIN) 300 MG capsule Take 1 capsule (300 mg total) by mouth daily. 90 capsule 3  . losartan (COZAAR) 50 MG tablet Take 1 tablet (50 mg total) by mouth daily. 90 tablet 3  . Multiple Vitamin (MULTIVITAMIN) tablet Take 1 tablet by mouth daily.      . ranitidine (ZANTAC) 150 MG tablet Take 1 tablet (150 mg total) by mouth at bedtime. 90 tablet 3  . SUMAtriptan (IMITREX) 100 MG tablet TAKE 1 TABLET (100 MG TOTAL) BY MOUTH EVERY 2 (TWO) HOURS AS NEEDED. 10 tablet 3  . topiramate (TOPAMAX) 100 MG tablet Take 1 tablet (100 mg total) by mouth daily. 90 tablet 3  . traMADol (ULTRAM) 50 MG tablet Take two tabs in the morning and one tab in the afternoon as needed 270 tablet 0  . traZODone (DESYREL) 50 MG tablet TAKE 1 OR 2 TABLETS BY MOUTH AT BEDTIME AS NEEDED 60 tablet 0   No current  facility-administered medications on file prior to visit.     Past Medical History:  Diagnosis Date  . DEPRESSION   . DIVERTICULOSIS, COLON   . DYSLIPIDEMIA   . GERD   . HYPERTENSION   . MIGRAINE HEADACHE   . Multinodular goiter   . NONSPECIFIC ABNORMAL ELECTROCARDIOGRAM 09/15/2010   normal stress test 09/2010  . PLANTAR FASCIITIS   . SPINAL STENOSIS     Past Surgical History:  Procedure Laterality Date  . CATARACT EXTRACTION Left 07/05/13   [facility]  . CATARACT EXTRACTION  winter 2015  . child birth     x's 2 11 & 23  . TONSILLECTOMY      Social History   Socioeconomic History  . Marital status: Married    Spouse name: Not on file  . Number of children: Not on file  . Years of education: Not on file  . Highest education level: Not on file  Occupational History  . Not on file  Social Needs  . Financial resource strain: Not on file  . Food insecurity:    Worry: Not on file    Inability: Not on file  . Transportation needs:    Medical: Not on file    Non-medical: Not on file  Tobacco Use  . Smoking status: Never Smoker  . Smokeless tobacco: Never Used  .  Tobacco comment: Married, lives with spouse and 2 sons involved with social activites with spouse at [employer] and church  Substance and Sexual Activity  . Alcohol use: No  . Drug use: No  . Sexual activity: Not on file  Lifestyle  . Physical activity:    Days per week: Not on file    Minutes per session: Not on file  . Stress: Not on file  Relationships  . Social connections:    Talks on phone: Not on file    Gets together: Not on file    Attends religious service: Not on file    Active member of club or organization: Not on file    Attends meetings of clubs or organizations: Not on file    Relationship status: Not on file  Other Topics Concern  . Not on file  Social History Narrative   Exercise; walking dog only    Family History  Problem Relation Age of Onset  . Arthritis Mother   .  Arthritis Father   . Hyperlipidemia Other        parents  . Heart disease Other        parent, grandparents  . Breast cancer Maternal Aunt     Review of Systems     Objective:  There were no vitals filed for this visit. BP Readings from Last 3 Encounters:  08/02/17 (!) 148/80  02/08/17 (!) 162/94  11/05/16 106/62   Wt Readings from Last 3 Encounters:  08/02/17 156 lb (70.8 kg)  02/08/17 161 lb (73 kg)  08/06/16 156 lb (70.8 kg)   There is no height or weight on file to calculate BMI.   Physical Exam          Assessment & Plan:    See Problem List for Assessment and Plan of chronic medical problems.   This encounter was created in error - please disregard.

## 2018-01-30 NOTE — Patient Instructions (Signed)
  Test(s) ordered today. Your results will be released to MyChart (or called to you) after review, usually within 72hours after test completion. If any changes need to be made, you will be notified at that same time.  All other Health Maintenance issues reviewed.   All recommended immunizations and age-appropriate screenings are up-to-date or discussed.  No immunizations administered today.   Medications reviewed and updated.  Changes include  /  No changes recommended at this time.  Your prescription(s) have been submitted to your pharmacy. Please take as directed and contact our office if you believe you are having problem(s) with the medication(s).  A referral was ordered for   Please followup in 6 months   

## 2018-01-31 ENCOUNTER — Encounter: Payer: Medicare Other | Admitting: Internal Medicine

## 2018-01-31 DIAGNOSIS — Z0289 Encounter for other administrative examinations: Secondary | ICD-10-CM

## 2018-02-24 ENCOUNTER — Other Ambulatory Visit: Payer: Self-pay | Admitting: Internal Medicine

## 2018-02-24 NOTE — Telephone Encounter (Signed)
Copied from [place] [id number]. Topic: Quick Communication - Rx Refill/Question >> Feb 24, 2018  2:13 PM Boyd Kerbs wrote: Medication:   traMADol (ULTRAM) 50 MG tablet [facility] that [facility] won't give her more that 1 week at a time.  Dr. Lynnda Child need to approve this for insurance to pay ([facility] )   Has the patient contacted their pharmacy? Yes.   (Agent: If no, request that the patient contact the pharmacy for the refill.) (Agent: If yes, when and what did the pharmacy advise?)  Preferred Pharmacy (with phone number or street name):   [id number] [address], [address] [place] [address] Phone: [phone] Fax: [phone]    Agent: Please be advised that RX refills may take up to 3 business days. We ask that you follow-up with your pharmacy.

## 2018-02-24 NOTE — Telephone Encounter (Signed)
traMADol (ULTRAM) 50 MG tablet [facility] reports pt's insurance will fill for only 1 week at a time.  PCP would need to approve this for insurance to pay.  LOV 01/31/18  Dr. Quay Burow  [facility] 10/19/17  #[phone]  0 refills    Preferred Pharmacy (with phone number or street name):   [id number] [place], [address] [place] [address]

## 2018-02-24 NOTE — Telephone Encounter (Signed)
Spoke with Pharmacy. States PA will need to be completed in order for insurance to cover. Pt last filled on 02/22/18 for 6 days

## 2018-03-02 NOTE — Telephone Encounter (Signed)
PA completed. [facility]

## 2018-03-06 NOTE — Telephone Encounter (Signed)
PA approved. Spoke with pharmacy. Will need a new RX for 90 day script.   [place] Controlled Substance Database checked. Last filled on 02/22/18 for 6 days

## 2018-03-07 MED ORDER — TRAMADOL HCL 50 MG PO TABS: ORAL_TABLET | ORAL | 0 refills | Status: DC

## 2018-03-16 NOTE — Progress Notes (Signed)
Subjective:    Patient ID: [name], female    DOB: 1941-03-28, 77 y.o.   MRN: [id number]  HPI The patient is here for follow up.  Mild swelling and soreness in her lateral elbow. It hurt the other day when she was driving.  It started about a couple of months.  No pain with flexing her elbow.  Hurts with certain movements and when she pushes in the area.  She has upper back and right shoulder pain.  She thinks this is arthritis.   Darker skin:  She continues to have darker skin in her groin area and armpits. It does not itch.    Hypertension: She is taking her medication daily. She is compliant with a low sodium diet.  She denies chest pain, palpitations, edema, shortness of breath and regular headaches. She is exercising regularly, but not enough- walking dog.  She does not monitor her blood pressure at home.    Hyperlipidemia: She is taking her medication daily. She is compliant with a low fat/cholesterol diet. She is exercising some. She denies myalgias.   GERD:  She is taking her medication daily as prescribed.  She has GERD symptoms almost daily.  She thinks it is her coffee.    Lumbar Spinal stenosis: She is no longer following with her back specialist.  I am prescribing her gabapentin and tramadol.  This does control her pain.  Insomnia: She takes trazodone only as needed - not often.  She does not take it nightly.she gets up at 4:30-5 in the morning.  She does nap in the afternoon.    Migraine headaches: She is taking Topamax daily.  She does have Imitrex that she can use as needed, but has not used it in a very long time.  She feels her migraines are controlled.  Increased irritation, anxiety, depression: Her husband has dementia and other medical problems and she finds herself getting more irritated and anxious taking care of him.  She has mild depression.  She wonders if something would help this.   Medications and allergies reviewed with patient and updated if  appropriate.  Patient Active Problem List   Diagnosis Date Noted  . Skin abnormality 03/17/2018  . 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 Squamous cell cancer of skin of hand 08/05/2015  . Difficulty sleeping 08/05/2015  . NONSPECIFIC ABNORMAL ELECTROCARDIOGRAM 09/15/2010  . GERD 01/09/2010  . DIVERTICULOSIS, COLON 01/09/2010  . Spinal stenosis of lumbar region 01/09/2010  . PLANTAR FASCIITIS 01/09/2010  . Hyperlipidemia 12/29/2009  . Depression 12/29/2009  . Migraine headache 12/29/2009  . Essential hypertension 12/29/2009    Current Outpatient Medications on File Prior to Visit  Medication Sig Dispense Refill  . atorvastatin (LIPITOR) 20 MG tablet Take 1 tablet (20 mg total) by mouth daily. 90 tablet 3  . Calcium Citrate-Vitamin D (CITRACAL PETITES/VITAMIN D) 200-250 MG-UNIT TABS Take by mouth 2 (two) times daily.      . Cholecalciferol (VITAMIN D3) 1000 UNITS CAPS Take by mouth daily.      . Coenzyme Q10 (CO Q 10 PO) Take by mouth daily.    Marland Kitchen gabapentin (NEURONTIN) 300 MG capsule Take 1 capsule (300 mg total) by mouth daily. 90 capsule 3  . losartan (COZAAR) 50 MG tablet Take 1 tablet (50 mg total) by mouth daily. 90 tablet 3  . Multiple Vitamin (MULTIVITAMIN) tablet Take 1 tablet by mouth daily.      . ranitidine (ZANTAC) 150 MG tablet Take 1 tablet (  150 mg total) by mouth at bedtime. 90 tablet 3  . SUMAtriptan (IMITREX) 100 MG tablet TAKE 1 TABLET (100 MG TOTAL) BY MOUTH EVERY 2 (TWO) HOURS AS NEEDED. 10 tablet 3  . topiramate (TOPAMAX) 100 MG tablet Take 1 tablet (100 mg total) by mouth daily. 90 tablet 3  . traMADol (ULTRAM) 50 MG tablet Take two tabs in the morning and one tab in the afternoon as needed 270 tablet 0  . traZODone (DESYREL) 50 MG tablet TAKE 1 OR 2 TABLETS BY MOUTH AT BEDTIME AS NEEDED 60 tablet 0   No current facility-administered medications on file prior to visit.     Past Medical History:  Diagnosis Date  . DEPRESSION   . DIVERTICULOSIS, COLON   .  DYSLIPIDEMIA   . GERD   . HYPERTENSION   . MIGRAINE HEADACHE   . Multinodular goiter   . NONSPECIFIC ABNORMAL ELECTROCARDIOGRAM 09/15/2010   normal stress test 09/2010  . PLANTAR FASCIITIS   . SPINAL STENOSIS     Past Surgical History:  Procedure Laterality Date  . CATARACT EXTRACTION Left 07/05/13   [facility]  . CATARACT EXTRACTION  winter 2015  . child birth     x's 2 67 & 19  . TONSILLECTOMY      Social History   Socioeconomic History  . Marital status: Married    Spouse name: Not on file  . Number of children: Not on file  . Years of education: Not on file  . Highest education level: Not on file  Occupational History  . Not on file  Social Needs  . Financial resource strain: Not on file  . Food insecurity:    Worry: Not on file    Inability: Not on file  . Transportation needs:    Medical: Not on file    Non-medical: Not on file  Tobacco Use  . Smoking status: Never Smoker  . Smokeless tobacco: Never Used  . Tobacco comment: Married, lives with spouse and 2 sons involved with social activites with spouse at [employer] and church  Substance and Sexual Activity  . Alcohol use: No  . Drug use: No  . Sexual activity: Not on file  Lifestyle  . Physical activity:    Days per week: Not on file    Minutes per session: Not on file  . Stress: Not on file  Relationships  . Social connections:    Talks on phone: Not on file    Gets together: Not on file    Attends religious service: Not on file    Active member of club or organization: Not on file    Attends meetings of clubs or organizations: Not on file    Relationship status: Not on file  Other Topics Concern  . Not on file  Social History Narrative   Exercise; walking dog only    Family History  Problem Relation Age of Onset  . Arthritis Mother   . Arthritis Father   . Hyperlipidemia Other        parents  . Heart disease Other        parent, grandparents  . Breast cancer Maternal Aunt     Review  of Systems  Constitutional: Negative for chills and fever.  HENT: Positive for voice change (hoarse at times, clears throat occ).   Respiratory: Negative for cough, shortness of breath and wheezing.   Cardiovascular: Negative for chest pain, palpitations and leg swelling.  Skin: Positive for rash.  Neurological: Negative  for light-headedness and headaches.       Objective:   Vitals:   03/17/18 1327  BP: (!) 150/74  Pulse: 71  SpO2: 95%   BP Readings from Last 3 Encounters:  03/17/18 (!) 150/74  08/02/17 (!) 148/80  02/08/17 (!) 162/94   Wt Readings from Last 3 Encounters:  03/17/18 159 lb (72.1 kg)  08/02/17 156 lb (70.8 kg)  02/08/17 161 lb (73 kg)   Body mass index is 28.17 kg/m.   Physical Exam    Constitutional: Appears well-developed and well-nourished. No distress.  HENT:  Head: Normocephalic and atraumatic.  Neck: Neck supple. No tracheal deviation present. No thyromegaly present.  No cervical lymphadenopathy Cardiovascular: Normal rate, regular rhythm and normal heart sounds.   No murmur heard. No carotid bruit .  No edema Pulmonary/Chest: Effort normal and breath sounds normal. No respiratory distress. No has no wheezes. No rales.  Skin: Skin is warm and dry. Not diaphoretic.  Psychiatric: Normal mood and affect. Behavior is normal.      Assessment & Plan:    See Problem List for Assessment and Plan of chronic medical problems.

## 2018-03-17 ENCOUNTER — Encounter: Payer: Self-pay | Admitting: Internal Medicine

## 2018-03-17 ENCOUNTER — Ambulatory Visit (INDEPENDENT_AMBULATORY_CARE_PROVIDER_SITE_OTHER): Payer: Medicare Other | Admitting: Internal Medicine

## 2018-03-17 ENCOUNTER — Other Ambulatory Visit (INDEPENDENT_AMBULATORY_CARE_PROVIDER_SITE_OTHER): Payer: Medicare Other

## 2018-03-17 VITALS — BP 150/74 | HR 71 | Ht 63.0 in | Wt 159.0 lb

## 2018-03-17 DIAGNOSIS — M48061 Spinal stenosis, lumbar region without neurogenic claudication: Secondary | ICD-10-CM | POA: Diagnosis not present

## 2018-03-17 DIAGNOSIS — F329 Major depressive disorder, single episode, unspecified: Secondary | ICD-10-CM

## 2018-03-17 DIAGNOSIS — G43809 Other migraine, not intractable, without status migrainosus: Secondary | ICD-10-CM

## 2018-03-17 DIAGNOSIS — L989 Disorder of the skin and subcutaneous tissue, unspecified: Secondary | ICD-10-CM | POA: Insufficient documentation

## 2018-03-17 DIAGNOSIS — M25529 Pain in unspecified elbow: Secondary | ICD-10-CM | POA: Insufficient documentation

## 2018-03-17 DIAGNOSIS — K219 Gastro-esophageal reflux disease without esophagitis: Secondary | ICD-10-CM | POA: Diagnosis not present

## 2018-03-17 DIAGNOSIS — G479 Sleep disorder, unspecified: Secondary | ICD-10-CM

## 2018-03-17 DIAGNOSIS — F32A Depression, unspecified: Secondary | ICD-10-CM

## 2018-03-17 DIAGNOSIS — I10 Essential (primary) hypertension: Secondary | ICD-10-CM

## 2018-03-17 DIAGNOSIS — M25521 Pain in right elbow: Secondary | ICD-10-CM

## 2018-03-17 DIAGNOSIS — E7849 Other hyperlipidemia: Secondary | ICD-10-CM | POA: Diagnosis not present

## 2018-03-17 DIAGNOSIS — F419 Anxiety disorder, unspecified: Secondary | ICD-10-CM

## 2018-03-17 LAB — COMPREHENSIVE METABOLIC PANEL
ALT: 22 U/L (ref 0–35)
AST: 17 U/L (ref 0–37)
Albumin: 4.4 g/dL (ref 3.5–5.2)
Alkaline Phosphatase: 94 U/L (ref 39–117)
BILIRUBIN TOTAL: 0.3 mg/dL (ref 0.2–1.2)
BUN: 19 mg/dL (ref 6–23)
CALCIUM: 9.4 mg/dL (ref 8.4–10.5)
CO2: 25 meq/L (ref 19–32)
Chloride: 107 mEq/L (ref 96–112)
Creatinine, Ser: 0.97 mg/dL (ref 0.40–1.20)
GFR: 59.33 mL/min — AB (ref >60.00)
GLUCOSE: 100 mg/dL — AB (ref 70–99)
POTASSIUM: 4 meq/L (ref 3.5–5.1)
Sodium: 141 mEq/L (ref 135–145)
Total Protein: 7 g/dL (ref 6.0–8.3)

## 2018-03-17 MED ORDER — CLONAZEPAM 0.25 MG PO TBDP: 0.2500 mg | ORAL_TABLET | Freq: Two times a day (BID) | ORAL | 0 refills | Status: DC

## 2018-03-17 MED ORDER — NYSTATIN 100000 UNIT/GM EX CREA: 1.0000 "application " | TOPICAL_CREAM | Freq: Two times a day (BID) | CUTANEOUS | 1 refills | Status: DC

## 2018-03-17 MED ORDER — RANITIDINE HCL 150 MG PO TABS: 150.0000 mg | ORAL_TABLET | Freq: Two times a day (BID) | ORAL | 3 refills | Status: DC

## 2018-03-17 MED ORDER — LOSARTAN POTASSIUM 100 MG PO TABS: 100.0000 mg | ORAL_TABLET | Freq: Every day | ORAL | 3 refills | Status: DC

## 2018-03-17 NOTE — Assessment & Plan Note (Signed)
Skin is darker in b/l groin region and axilla No itch ? Mild yeast infection Trial of nystatin

## 2018-03-17 NOTE — Assessment & Plan Note (Signed)
Continue statin. 

## 2018-03-17 NOTE — Assessment & Plan Note (Signed)
She states more anxiety and increased irritation than true depression This is related to caring for her husband who has memory issues She is taking tramadol on a regular basis and should not ideally take an SSRI We will do a trial of low-dose clonazepam twice daily to see if that helps with her anxiety and irritation Continue regular walking/exercise Follow-up in 1 month

## 2018-03-17 NOTE — Assessment & Plan Note (Signed)
Blood pressure not ideally controlled-it has been elevated the last 3 time she was here She does not monitor her regularly Will try increasing losartan to 100 mg daily Can recheck in 1 month Ideally asked her to check home blood pressure CMP

## 2018-03-17 NOTE — Assessment & Plan Note (Signed)
Chronic pain Fairly controlled with gabapentin and tramadol Continue above

## 2018-03-17 NOTE — Assessment & Plan Note (Signed)
Takes trazodone rarely-only as needed Can continue She denies feeling fatigued throughout the day

## 2018-03-17 NOTE — Assessment & Plan Note (Addendum)
Not controlled Increase zantac to 150 mg BID or 300 mg QD Consider decaf coffee or switching coffee brands

## 2018-03-17 NOTE — Assessment & Plan Note (Signed)
Likely tendinitis Breast, avoid activities that cause pain Ice Can refer to sports medicine if needed

## 2018-03-17 NOTE — Assessment & Plan Note (Signed)
Migraines controlled Continue topamax daily imitrex as needed - has not needed imitrex

## 2018-03-17 NOTE — Patient Instructions (Addendum)
  Test(s) ordered today. Your results will be released to [facility] (or called to you) after review, usually within 72hours after test completion. If any changes need to be made, you will be notified at that same time.  Medications reviewed and updated.  Changes include increasing zantac to 150 mg twice daily or 300 mg daily.  We will also increase the losartan to 100 mg daily.  We will try a cream for your rash.  We will start clonazepam twice daily for anxiety.    Your prescription(s) have been submitted to your pharmacy. Please take as directed and contact our office if you believe you are having problem(s) with the medication(s).    Please followup in 1 month

## 2018-04-11 ENCOUNTER — Other Ambulatory Visit: Payer: Self-pay | Admitting: Internal Medicine

## 2018-04-19 NOTE — Progress Notes (Signed)
Subjective:    Patient ID: [name], female    DOB: 02-26-1941, 77 y.o.   MRN: [id number]  HPI The patient is here for follow up.  She is here for follow-up of anxiety, depression and increased irritability.  Her husband has dementia and she is his primary caregiver.  She was experiencing mild depression and increased anxiety.  We started her on clonazepam 0.25 mg twice daily.  She denies side effects.  She feels it has helped and wants to continue the medication.    Hypertension: We increased her losartan to 100 mg 1 month ago.  She is taking her medication daily as prescribed. She denies cp, palps, SOB and headaches.    GERD: We increased her Zantac to 150 mg twice daily or 300 mg daily due to uncontrolled GERD.  Her GERD is better.  She will occasionally get GERD now.  She has not changed her coffee yet, but she will.    Lumbar spinal stenosis:  She has chronic lower back pain.  In the late afternoon and at night she has left buttock and left leg pain.  This is not a chronic pain - it started a few weeks ago.  It does not bother her during the day - only when she lays down on her left side.  It is better if she lays on the right side.  She takes gabapentin every morning.  She wondered if she could take gabapentin at night as well.  Medications and allergies reviewed with patient and updated if appropriate.  Patient Active Problem List   Diagnosis Date Noted  . Skin abnormality 03/17/2018  . Elbow pain 03/17/2018  . 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 Squamous cell cancer of skin of hand 08/05/2015  . Difficulty sleeping 08/05/2015  . NONSPECIFIC ABNORMAL ELECTROCARDIOGRAM 09/15/2010  . GERD 01/09/2010  . DIVERTICULOSIS, COLON 01/09/2010  . Spinal stenosis of lumbar region 01/09/2010  . PLANTAR FASCIITIS 01/09/2010  . Hyperlipidemia 12/29/2009  . Anxiety and depression 12/29/2009  . Migraine headache 12/29/2009  . Essential hypertension 12/29/2009    Current  Outpatient Medications on File Prior to Visit  Medication Sig Dispense Refill  . atorvastatin (LIPITOR) 20 MG tablet Take 1 tablet (20 mg total) by mouth daily. 90 tablet 3  . Calcium Citrate-Vitamin D (CITRACAL PETITES/VITAMIN D) 200-250 MG-UNIT TABS Take by mouth 2 (two) times daily.      . Cholecalciferol (VITAMIN D3) 1000 UNITS CAPS Take by mouth daily.      . Coenzyme Q10 (CO Q 10 PO) Take by mouth daily.    Marland Kitchen losartan (COZAAR) 100 MG tablet Take 1 tablet (100 mg total) by mouth daily. 90 tablet 3  . Multiple Vitamin (MULTIVITAMIN) tablet Take 1 tablet by mouth daily.      Marland Kitchen nystatin cream (MYCOSTATIN) Apply 1 application topically 2 (two) times daily. 30 g 1  . ranitidine (ZANTAC) 150 MG tablet Take 1 tablet (150 mg total) by mouth 2 (two) times daily. 180 tablet 3  . SUMAtriptan (IMITREX) 100 MG tablet TAKE 1 TABLET (100 MG TOTAL) BY MOUTH EVERY 2 (TWO) HOURS AS NEEDED. 10 tablet 3  . topiramate (TOPAMAX) 100 MG tablet Take 1 tablet (100 mg total) by mouth daily. 90 tablet 3  . traMADol (ULTRAM) 50 MG tablet Take two tabs in the morning and one tab in the afternoon as needed 270 tablet 0  . traZODone (DESYREL) 50 MG tablet TAKE 1 OR 2 TABLETS BY MOUTH  AT BEDTIME AS NEEDED 60 tablet 0   No current facility-administered medications on file prior to visit.     Past Medical History:  Diagnosis Date  . DEPRESSION   . DIVERTICULOSIS, COLON   . DYSLIPIDEMIA   . GERD   . HYPERTENSION   . MIGRAINE HEADACHE   . Multinodular goiter   . NONSPECIFIC ABNORMAL ELECTROCARDIOGRAM 09/15/2010   normal stress test 09/2010  . PLANTAR FASCIITIS   . SPINAL STENOSIS     Past Surgical History:  Procedure Laterality Date  . CATARACT EXTRACTION Left 07/05/13   [facility]  . CATARACT EXTRACTION  winter 2015  . child birth     x's 2 29 & 79  . TONSILLECTOMY      Social History   Socioeconomic History  . Marital status: Married    Spouse name: Not on file  . Number of children: Not on file  .  Years of education: Not on file  . Highest education level: Not on file  Occupational History  . Not on file  Social Needs  . Financial resource strain: Not on file  . Food insecurity:    Worry: Not on file    Inability: Not on file  . Transportation needs:    Medical: Not on file    Non-medical: Not on file  Tobacco Use  . Smoking status: Never Smoker  . Smokeless tobacco: Never Used  . Tobacco comment: Married, lives with spouse and 2 sons involved with social activites with spouse at [employer] and church  Substance and Sexual Activity  . Alcohol use: No  . Drug use: No  . Sexual activity: Not on file  Lifestyle  . Physical activity:    Days per week: Not on file    Minutes per session: Not on file  . Stress: Not on file  Relationships  . Social connections:    Talks on phone: Not on file    Gets together: Not on file    Attends religious service: Not on file    Active member of club or organization: Not on file    Attends meetings of clubs or organizations: Not on file    Relationship status: Not on file  Other Topics Concern  . Not on file  Social History Narrative   Exercise; walking dog only    Family History  Problem Relation Age of Onset  . Arthritis Mother   . Arthritis Father   . Hyperlipidemia Other        parents  . Heart disease Other        parent, grandparents  . Breast cancer Maternal Aunt     Review of Systems  Constitutional: Negative for fever.  Respiratory: Negative for shortness of breath.   Cardiovascular: Negative for chest pain, palpitations and leg swelling.  Musculoskeletal: Positive for back pain.       Pain down left leg to mid thigh  Neurological: Negative for weakness, light-headedness, numbness and headaches.       Objective:   Vitals:   04/20/18 1104  BP: (!) 142/80  Pulse: 68  Resp: 16  Temp: 98.2 F (36.8 C)  SpO2: 97%   BP Readings from Last 3 Encounters:  04/20/18 (!) 142/80  03/17/18 (!) 150/74    08/02/17 (!) 148/80   Wt Readings from Last 3 Encounters:  04/20/18 159 lb (72.1 kg)  03/17/18 159 lb (72.1 kg)  08/02/17 156 lb (70.8 kg)   Body mass index is 28.17 kg/m.  Physical Exam    Constitutional: Appears well-developed and well-nourished. No distress.  HENT:  Head: Normocephalic and atraumatic.  Neck: Neck supple. No tracheal deviation present. No thyromegaly present.  No cervical lymphadenopathy Cardiovascular: Normal rate, regular rhythm and normal heart sounds.   No murmur heard. No carotid bruit .  No edema Pulmonary/Chest: Effort normal and breath sounds normal. No respiratory distress. No has no wheezes. No rales.  MSK: Neuro:   Skin: Skin is warm and dry. Not diaphoretic.  Psychiatric: Normal mood and affect. Behavior is normal.      Assessment & Plan:    See Problem List for Assessment and Plan of chronic medical problems.

## 2018-04-19 NOTE — Patient Instructions (Addendum)
  Medications reviewed and updated.  Changes include increasing gabapentin to 300 mg twice daily.  Continue your other medications.    Your prescription(s) have been submitted to your pharmacy. Please take as directed and contact our office if you believe you are having problem(s) with the medication(s).   Please followup in 6 months, sooner if needed

## 2018-04-20 ENCOUNTER — Ambulatory Visit (INDEPENDENT_AMBULATORY_CARE_PROVIDER_SITE_OTHER): Payer: Medicare Other | Admitting: Internal Medicine

## 2018-04-20 ENCOUNTER — Encounter: Payer: Self-pay | Admitting: Internal Medicine

## 2018-04-20 VITALS — BP 142/80 | HR 68 | Temp 98.2°F | Resp 16 | Ht 63.0 in | Wt 159.0 lb

## 2018-04-20 DIAGNOSIS — F419 Anxiety disorder, unspecified: Secondary | ICD-10-CM | POA: Diagnosis not present

## 2018-04-20 DIAGNOSIS — Z23 Encounter for immunization: Secondary | ICD-10-CM

## 2018-04-20 DIAGNOSIS — K219 Gastro-esophageal reflux disease without esophagitis: Secondary | ICD-10-CM | POA: Diagnosis not present

## 2018-04-20 DIAGNOSIS — M48061 Spinal stenosis, lumbar region without neurogenic claudication: Secondary | ICD-10-CM

## 2018-04-20 DIAGNOSIS — I10 Essential (primary) hypertension: Secondary | ICD-10-CM | POA: Diagnosis not present

## 2018-04-20 DIAGNOSIS — F329 Major depressive disorder, single episode, unspecified: Secondary | ICD-10-CM

## 2018-04-20 MED ORDER — GABAPENTIN 300 MG PO CAPS: 300.0000 mg | ORAL_CAPSULE | Freq: Two times a day (BID) | ORAL | 3 refills | Status: DC

## 2018-04-20 MED ORDER — CLONAZEPAM 0.25 MG PO TBDP: 0.2500 mg | ORAL_TABLET | Freq: Two times a day (BID) | ORAL | 1 refills | Status: DC

## 2018-04-20 NOTE — Assessment & Plan Note (Signed)
Her chronic pain is controlled with gabapentin and tramadol The past few weeks she started having additional pain consistent with radiculopathy-pain down her left leg posteriorly.  This may be related to her spinal stenosis or a separate issue Continue tramadol as needed Increase gabapentin to 300 mg twice daily If no improvement will refer to sports medicine, she deferred today Avoid bending, twisting and lifting

## 2018-04-20 NOTE — Assessment & Plan Note (Signed)
Improved We will continue current medications at current doses Follow-up in 6 months, sooner if needed

## 2018-04-20 NOTE — Assessment & Plan Note (Signed)
GERD much improved Continue Zantac When she finishes the coffee she has she will switch to decaf

## 2018-04-20 NOTE — Assessment & Plan Note (Signed)
Anxiety and irritability improved Tolerating clonazepam twice daily without side effects Will continue clonazepam 0.25 mg twice daily

## 2018-06-14 ENCOUNTER — Other Ambulatory Visit: Payer: Self-pay | Admitting: Internal Medicine

## 2018-06-15 ENCOUNTER — Other Ambulatory Visit: Payer: Self-pay | Admitting: Internal Medicine

## 2018-06-15 NOTE — Telephone Encounter (Signed)
Check [facility] registry last filled 05/10/2018../lmb  

## 2018-06-22 DIAGNOSIS — M549 Dorsalgia, unspecified: Secondary | ICD-10-CM | POA: Diagnosis not present

## 2018-06-22 DIAGNOSIS — M47816 Spondylosis without myelopathy or radiculopathy, lumbar region: Secondary | ICD-10-CM | POA: Diagnosis not present

## 2018-06-22 DIAGNOSIS — M5126 Other intervertebral disc displacement, lumbar region: Secondary | ICD-10-CM | POA: Diagnosis not present

## 2018-06-22 DIAGNOSIS — M7062 Trochanteric bursitis, left hip: Secondary | ICD-10-CM | POA: Diagnosis not present

## 2018-07-03 DIAGNOSIS — L438 Other lichen planus: Secondary | ICD-10-CM | POA: Diagnosis not present

## 2018-07-25 DIAGNOSIS — M5126 Other intervertebral disc displacement, lumbar region: Secondary | ICD-10-CM | POA: Diagnosis not present

## 2018-07-25 DIAGNOSIS — M7062 Trochanteric bursitis, left hip: Secondary | ICD-10-CM | POA: Diagnosis not present

## 2018-07-25 DIAGNOSIS — M47816 Spondylosis without myelopathy or radiculopathy, lumbar region: Secondary | ICD-10-CM | POA: Diagnosis not present

## 2018-08-02 ENCOUNTER — Telehealth: Payer: Self-pay | Admitting: Internal Medicine

## 2018-08-02 MED ORDER — SUMATRIPTAN SUCCINATE 100 MG PO TABS: ORAL_TABLET | ORAL | 1 refills | Status: DC

## 2018-08-02 NOTE — Telephone Encounter (Signed)
Reviewed chart pt is up-to-date sent refills to requested pharmacy.../lmb  

## 2018-08-02 NOTE — Telephone Encounter (Signed)
Copied from [place] [id number]. Topic: Quick Communication - Rx Refill/Question >> Aug 02, 2018  8:37 AM Bea Graff, NT wrote: Medication: SUMAtriptan (IMITREX) 100 MG tablet   Has the patient contacted their pharmacy? Yes.   (Agent: If no, request that the patient contact the pharmacy for the refill.) (Agent: If yes, when and what did the pharmacy advise?)  Preferred Pharmacy (with phone number or street name): [place], [place] [phone] (Phone) [phone] (Fax)    Agent: Please be advised that RX refills may take up to 3 business days. We ask that you follow-up with your pharmacy.

## 2018-08-02 NOTE — Telephone Encounter (Signed)
Requested medication (s) are due for refill today - old Rx  Requested medication (s) are on the active medication list - yes  Future visit scheduled -yes  Last refill: 12/23/2015  Notes to clinic: Patient is requesting a refill on Rx written 2017- sent for provider review. Failed BP protocol.  Requested Prescriptions  Pending Prescriptions Disp Refills   SUMAtriptan (IMITREX) 100 MG tablet 10 tablet 3    Sig: TAKE 1 TABLET (100 MG TOTAL) BY MOUTH EVERY 2 (TWO) HOURS AS NEEDED.     Neurology:  Migraine Therapy - Triptan Failed - 08/02/2018  9:05 AM      Failed - Last BP in normal range    BP Readings from Last 1 Encounters:  04/20/18 (!) 142/80         Passed - Valid encounter within last 12 months    Recent Outpatient Visits          3 months ago Essential hypertension   [place], Claudina Lick, MD   4 months ago Essential hypertension   [place], Claudina Lick, MD   6 months ago Essential hypertension   [place], Claudina Lick, MD   1 year ago Essential hypertension   [place], Claudina Lick, MD   1 year ago Essential hypertension   [place], Stacy J, MD      Future Appointments            In 1 month [place], Claudina Lick, MD [place], [facility]   In 2 months [place], Claudina Lick, MD [place], [facility]            Requested Prescriptions  Pending Prescriptions Disp Refills   SUMAtriptan (IMITREX) 100 MG tablet 10 tablet 3    Sig: TAKE 1 TABLET (100 MG TOTAL) BY MOUTH EVERY 2 (TWO) HOURS AS NEEDED.     Neurology:  Migraine Therapy - Triptan Failed - 08/02/2018  9:05 AM      Failed - Last BP in normal range    BP Readings from Last 1 Encounters:  04/20/18 (!) 142/80         Passed - Valid encounter within last 12 months    Recent Outpatient Visits          3 months ago  Essential hypertension   [place], Claudina Lick, MD   4 months ago Essential hypertension   [place], Claudina Lick, MD   6 months ago Essential hypertension   [place], Claudina Lick, MD   1 year ago Essential hypertension   [place], Claudina Lick, MD   1 year ago Essential hypertension   [place], Claudina Lick, MD      Future Appointments            In 1 month [place], Claudina Lick, MD [place], [place]   In 2 months Burns, Claudina Lick, MD [place], [facility]

## 2018-08-03 ENCOUNTER — Telehealth: Payer: Self-pay

## 2018-08-03 MED ORDER — SUMATRIPTAN SUCCINATE 100 MG PO TABS: 100.0000 mg | ORAL_TABLET | ORAL | 5 refills | Status: AC | PRN

## 2018-08-03 NOTE — Telephone Encounter (Signed)
Please advise about the directions on sumatriptan for pt. Seems as if they have been the same the whole time but [facility] is questioning directions.

## 2018-08-03 NOTE — Telephone Encounter (Signed)
resent

## 2018-08-03 NOTE — Telephone Encounter (Signed)
Copied from [place] [id number]. Topic: Quick Communication - Rx Refill/Question >> Aug 02, 2018  8:37 AM Bea Graff, NT wrote: Medication: SUMAtriptan (IMITREX) 100 MG tablet   Has the patient contacted their pharmacy? Yes.   (Agent: If no, request that the patient contact the pharmacy for the refill.) (Agent: If yes, when and what did the pharmacy advise?)  Preferred Pharmacy (with phone number or street name): [place], [place] [phone] (Phone) [phone] (Fax)    Agent: Please be advised that RX refills may take up to 3 business days. We ask that you follow-up with your pharmacy. >> Aug 03, 2018  3:45 PM Percell Belt A wrote: [facility] called in and needs clarification for this med.  They are stating that the directions are not typical for this med.  Please advise  cb  [phone] Ref# [id number]

## 2018-08-15 NOTE — Telephone Encounter (Signed)
[facility] calling to clarify dosage for SUMAtriptan (IMITREX) 100 MG tablet

## 2018-08-17 NOTE — Telephone Encounter (Signed)
Called to verify directions on rx.

## 2018-09-13 ENCOUNTER — Encounter: Payer: Medicare Other | Admitting: Internal Medicine

## 2018-09-22 DIAGNOSIS — M47816 Spondylosis without myelopathy or radiculopathy, lumbar region: Secondary | ICD-10-CM | POA: Diagnosis not present

## 2018-09-22 DIAGNOSIS — M7062 Trochanteric bursitis, left hip: Secondary | ICD-10-CM | POA: Diagnosis not present

## 2018-10-05 ENCOUNTER — Other Ambulatory Visit: Payer: Self-pay | Admitting: Internal Medicine

## 2018-10-05 NOTE — Telephone Encounter (Signed)
Done erx 

## 2018-10-05 NOTE — Telephone Encounter (Signed)
Md is out of the office this week pls advise.Marland KitchenJohny Chess

## 2018-10-18 ENCOUNTER — Other Ambulatory Visit: Payer: Self-pay | Admitting: Internal Medicine

## 2018-10-18 DIAGNOSIS — R7303 Prediabetes: Secondary | ICD-10-CM | POA: Insufficient documentation

## 2018-10-18 DIAGNOSIS — R739 Hyperglycemia, unspecified: Secondary | ICD-10-CM

## 2018-10-18 NOTE — Patient Instructions (Addendum)
Tests ordered today. Your results will be released to [facility] (or called to you) after review, usually within 72hours after test completion. If any changes need to be made, you will be notified at that same time.  All other Health Maintenance issues reviewed.   All recommended immunizations and age-appropriate screenings are up-to-date or discussed.  No immunizations administered today.   Medications reviewed and updated.  Changes include :  Take omeprazole 30 minutes prior to breakfast.  Take one ranitidine in the evening or at bedtime.     Try melatonin 1-2 mg at night for your sleep  Your prescription(s) have been submitted to your pharmacy. Please take as directed and contact our office if you believe you are having problem(s) with the medication(s).  Schedule your bone density.   Please followup in 6 months   Health Maintenance, Female Adopting a healthy lifestyle and getting preventive care can go a long way to promote health and wellness. Talk with your health care provider about what schedule of regular examinations is right for you. This is a good chance for you to check in with your provider about disease prevention and staying healthy. In between checkups, there are plenty of things you can do on your own. Experts have done a lot of research about which lifestyle changes and preventive measures are most likely to keep you healthy. Ask your health care provider for more information. Weight and diet Eat a healthy diet  Be sure to include plenty of vegetables, fruits, low-fat dairy products, and lean protein.  Do not eat a lot of foods high in solid fats, added sugars, or salt.  Get regular exercise. This is one of the most important things you can do for your health. ? Most adults should exercise for at least 150 minutes each week. The exercise should increase your heart rate and make you sweat (moderate-intensity exercise). ? Most adults should also do strengthening exercises  at least twice a week. This is in addition to the moderate-intensity exercise. Maintain a healthy weight  Body mass index (BMI) is a measurement that can be used to identify possible weight problems. It estimates body fat based on height and weight. Your health care provider can help determine your BMI and help you achieve or maintain a healthy weight.  For females 96 years of age and older: ? A BMI below 18.5 is considered underweight. ? A BMI of 18.5 to 24.9 is normal. ? A BMI of 25 to 29.9 is considered overweight. ? A BMI of 30 and above is considered obese. Watch levels of cholesterol and blood lipids  You should start having your blood tested for lipids and cholesterol at 77 years of age, then have this test every 5 years.  You may need to have your cholesterol levels checked more often if: ? Your lipid or cholesterol levels are high. ? You are older than 77 years of age. ? You are at high risk for heart disease. Cancer screening Lung Cancer  Lung cancer screening is recommended for adults 83-28 years old who are at high risk for lung cancer because of a history of smoking.  A yearly low-dose CT scan of the lungs is recommended for people who: ? Currently smoke. ? Have quit within the past 15 years. ? Have at least a 30-pack-year history of smoking. A pack year is smoking an average of one pack of cigarettes a day for 1 year.  Yearly screening should continue until it has been 15 years  since you quit.  Yearly screening should stop if you develop a health problem that would prevent you from having lung cancer treatment. Breast Cancer  Practice breast self-awareness. This means understanding how your breasts normally appear and feel.  It also means doing regular breast self-exams. Let your health care provider know about any changes, no matter how small.  If you are in your 20s or 30s, you should have a clinical breast exam (CBE) by a health care provider every 1-3 years as  part of a regular health exam.  If you are 14 or older, have a CBE every year. Also consider having a breast X-ray (mammogram) every year.  If you have a family history of breast cancer, talk to your health care provider about genetic screening.  If you are at high risk for breast cancer, talk to your health care provider about having an MRI and a mammogram every year.  Breast cancer gene (BRCA) assessment is recommended for women who have family members with BRCA-related cancers. BRCA-related cancers include: ? Breast. ? Ovarian. ? Tubal. ? Peritoneal cancers.  Results of the assessment will determine the need for genetic counseling and BRCA1 and BRCA2 testing. Cervical Cancer Your health care provider may recommend that you be screened regularly for cancer of the pelvic organs (ovaries, uterus, and vagina). This screening involves a pelvic examination, including checking for microscopic changes to the surface of your cervix (Pap test). You may be encouraged to have this screening done every 3 years, beginning at age 77.  For women ages 22-65, health care providers may recommend pelvic exams and Pap testing every 3 years, or they may recommend the Pap and pelvic exam, combined with testing for human papilloma virus (HPV), every 5 years. Some types of HPV increase your risk of cervical cancer. Testing for HPV may also be done on women of any age with unclear Pap test results.  Other health care providers may not recommend any screening for nonpregnant women who are considered low risk for pelvic cancer and who do not have symptoms. Ask your health care provider if a screening pelvic exam is right for you.  If you have had past treatment for cervical cancer or a condition that could lead to cancer, you need Pap tests and screening for cancer for at least 20 years after your treatment. If Pap tests have been discontinued, your risk factors (such as having a new sexual partner) need to be  reassessed to determine if screening should resume. Some women have medical problems that increase the chance of getting cervical cancer. In these cases, your health care provider may recommend more frequent screening and Pap tests. Colorectal Cancer  This type of cancer can be detected and often prevented.  Routine colorectal cancer screening usually begins at 77 years of age and continues through 77 years of age.  Your health care provider may recommend screening at an earlier age if you have risk factors for colon cancer.  Your health care provider may also recommend using home test kits to check for hidden blood in the stool.  A small camera at the end of a tube can be used to examine your colon directly (sigmoidoscopy or colonoscopy). This is done to check for the earliest forms of colorectal cancer.  Routine screening usually begins at age 79.  Direct examination of the colon should be repeated every 5-10 years through 77 years of age. However, you may need to be screened more often if early forms of  precancerous polyps or small growths are found. Skin Cancer  Check your skin from head to toe regularly.  Tell your health care provider about any new moles or changes in moles, especially if there is a change in a mole's shape or color.  Also tell your health care provider if you have a mole that is larger than the size of a pencil eraser.  Always use sunscreen. Apply sunscreen liberally and repeatedly throughout the day.  Protect yourself by wearing long sleeves, pants, a wide-brimmed hat, and sunglasses whenever you are outside. Heart disease, diabetes, and high blood pressure  High blood pressure causes heart disease and increases the risk of stroke. High blood pressure is more likely to develop in: ? People who have blood pressure in the high end of the normal range (130-139/85-89 mm Hg). ? People who are overweight or obese. ? People who are African American.  If you are  53-47 years of age, have your blood pressure checked every 3-5 years. If you are 31 years of age or older, have your blood pressure checked every year. You should have your blood pressure measured twice-once when you are at a hospital or clinic, and once when you are not at a hospital or clinic. Record the average of the two measurements. To check your blood pressure when you are not at a hospital or clinic, you can use: ? An automated blood pressure machine at a pharmacy. ? A home blood pressure monitor.  If you are between 58 years and 23 years old, ask your health care provider if you should take aspirin to prevent strokes.  Have regular diabetes screenings. This involves taking a blood sample to check your fasting blood sugar level. ? If you are at a normal weight and have a low risk for diabetes, have this test once every three years after 77 years of age. ? If you are overweight and have a high risk for diabetes, consider being tested at a younger age or more often. Preventing infection Hepatitis B  If you have a higher risk for hepatitis B, you should be screened for this virus. You are considered at high risk for hepatitis B if: ? You were born in a country where hepatitis B is common. Ask your health care provider which countries are considered high risk. ? Your parents were born in a high-risk country, and you have not been immunized against hepatitis B (hepatitis B vaccine). ? You have HIV or AIDS. ? You use needles to inject street drugs. ? You live with someone who has hepatitis B. ? You have had sex with someone who has hepatitis B. ? You get hemodialysis treatment. ? You take certain medicines for conditions, including cancer, organ transplantation, and autoimmune conditions. Hepatitis C  Blood testing is recommended for: ? Everyone born from [birth year] through 1964. ? Anyone with known risk factors for hepatitis C. Sexually transmitted infections (STIs)  You should be screened  for sexually transmitted infections (STIs) including gonorrhea and chlamydia if: ? You are sexually active and are younger than 77 years of age. ? You are older than 77 years of age and your health care provider tells you that you are at risk for this type of infection. ? Your sexual activity has changed since you were last screened and you are at an increased risk for chlamydia or gonorrhea. Ask your health care provider if you are at risk.  If you do not have HIV, but are at risk, it may  be recommended that you take a prescription medicine daily to prevent HIV infection. This is called pre-exposure prophylaxis (PrEP). You are considered at risk if: ? You are sexually active and do not regularly use condoms or know the HIV status of your partner(s). ? You take drugs by injection. ? You are sexually active with a partner who has HIV. Talk with your health care provider about whether you are at high risk of being infected with HIV. If you choose to begin PrEP, you should first be tested for HIV. You should then be tested every 3 months for as long as you are taking PrEP. Pregnancy  If you are premenopausal and you may become pregnant, ask your health care provider about preconception counseling.  If you may become pregnant, take 400 to 800 micrograms (mcg) of folic acid every day.  If you want to prevent pregnancy, talk to your health care provider about birth control (contraception). Osteoporosis and menopause  Osteoporosis is a disease in which the bones lose minerals and strength with aging. This can result in serious bone fractures. Your risk for osteoporosis can be identified using a bone density scan.  If you are 33 years of age or older, or if you are at risk for osteoporosis and fractures, ask your health care provider if you should be screened.  Ask your health care provider whether you should take a calcium or vitamin D supplement to lower your risk for osteoporosis.  Menopause may  have certain physical symptoms and risks.  Hormone replacement therapy may reduce some of these symptoms and risks. Talk to your health care provider about whether hormone replacement therapy is right for you. Follow these instructions at home:  Schedule regular health, dental, and eye exams.  Stay current with your immunizations.  Do not use any tobacco products including cigarettes, chewing tobacco, or electronic cigarettes.  If you are pregnant, do not drink alcohol.  If you are breastfeeding, limit how much and how often you drink alcohol.  Limit alcohol intake to no more than 1 drink per day for nonpregnant women. One drink equals 12 ounces of beer, 5 ounces of wine, or 1 ounces of hard liquor.  Do not use street drugs.  Do not share needles.  Ask your health care provider for help if you need support or information about quitting drugs.  Tell your health care provider if you often feel depressed.  Tell your health care provider if you have ever been abused or do not feel safe at home. This information is not intended to replace advice given to you by your health care provider. Make sure you discuss any questions you have with your health care provider. Document Released: 02/15/2011 Document Revised: 01/08/2016 Document Reviewed: 05/06/2015 Elsevier Interactive Patient Education  2019 [employer].

## 2018-10-18 NOTE — Progress Notes (Signed)
Subjective:    Patient ID: [name], female    DOB: [birth year], 78 y.o.   MRN: [id number]  HPI She is here for a physical exam.   She has no concerns.  She still has stress dealing with her husband who has alzheimer's disease.   She has no concerns.    Medications and allergies reviewed with patient and updated if appropriate.  Patient Active Problem List   Diagnosis Date Noted  . Hyperglycemia 10/18/2018  . Skin abnormality 03/17/2018  . Elbow pain 03/17/2018  . 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 Squamous cell cancer of skin of hand 08/05/2015  . Difficulty sleeping 08/05/2015  . NONSPECIFIC ABNORMAL ELECTROCARDIOGRAM 09/15/2010  . GERD 01/09/2010  . DIVERTICULOSIS, COLON 01/09/2010  . Spinal stenosis of lumbar region 01/09/2010  . PLANTAR FASCIITIS 01/09/2010  . Hyperlipidemia 12/29/2009  . Anxiety and depression 12/29/2009  . Migraine headache 12/29/2009  . Essential hypertension 12/29/2009    Current Outpatient Medications on File Prior to Visit  Medication Sig Dispense Refill  . atorvastatin (LIPITOR) 20 MG tablet TAKE 1 TABLET BY MOUTH  DAILY 90 tablet 1  . Calcium Citrate-Vitamin D (CITRACAL PETITES/VITAMIN D) 200-250 MG-UNIT TABS Take by mouth 2 (two) times daily.      . Cholecalciferol (VITAMIN D3) 1000 UNITS CAPS Take by mouth daily.      . clonazePAM (KLONOPIN) 0.25 MG disintegrating tablet Take 1 tablet (0.25 mg total) by mouth 2 (two) times daily. 180 tablet 1  . Coenzyme Q10 (CO Q 10 PO) Take by mouth daily.    Marland Kitchen gabapentin (NEURONTIN) 300 MG capsule Take 1 capsule (300 mg total) by mouth 2 (two) times daily. 180 capsule 3  . losartan (COZAAR) 100 MG tablet Take 1 tablet (100 mg total) by mouth daily. 90 tablet 3  . Multiple Vitamin (MULTIVITAMIN) tablet Take 1 tablet by mouth daily.      Marland Kitchen nystatin cream (MYCOSTATIN) Apply 1 application topically 2 (two) times daily. 30 g 1  . ranitidine (ZANTAC) 150 MG tablet Take 1 tablet (150 mg total) by  mouth 2 (two) times daily. 180 tablet 3  . SUMAtriptan (IMITREX) 100 MG tablet Take 1 tablet (100 mg total) by mouth as needed for migraine. May repeat in 2 hours if headache persists or recurs. 10 tablet 5  . topiramate (TOPAMAX) 100 MG tablet TAKE 1 TABLET BY MOUTH  DAILY 90 tablet 1  . traMADol (ULTRAM) 50 MG tablet TAKE 2 TABS IN THE MORNING AND 1 TAB IN THE AFTERNOON AS NEEDED 90 tablet 0   No current facility-administered medications on file prior to visit.     Past Medical History:  Diagnosis Date  . DEPRESSION   . DIVERTICULOSIS, COLON   . DYSLIPIDEMIA   . GERD   . HYPERTENSION   . MIGRAINE HEADACHE   . Multinodular goiter   . NONSPECIFIC ABNORMAL ELECTROCARDIOGRAM 09/15/2010   normal stress test 09/2010  . PLANTAR FASCIITIS   . SPINAL STENOSIS     Past Surgical History:  Procedure Laterality Date  . CATARACT EXTRACTION Left 07/05/13   [facility]  . CATARACT EXTRACTION  winter 2015  . child birth     x's 2 23 & 67  . TONSILLECTOMY      Social History   Socioeconomic History  . Marital status: Married    Spouse name: Not on file  . Number of children: Not on file  . Years of education: Not on file  .  Highest education level: Not on file  Occupational History  . Not on file  Social Needs  . Financial resource strain: Not on file  . Food insecurity:    Worry: Not on file    Inability: Not on file  . Transportation needs:    Medical: Not on file    Non-medical: Not on file  Tobacco Use  . Smoking status: Never Smoker  . Smokeless tobacco: Never Used  . Tobacco comment: Married, lives with spouse and 2 sons involved with social activites with spouse at [employer] and church  Substance and Sexual Activity  . Alcohol use: No  . Drug use: No  . Sexual activity: Not on file  Lifestyle  . Physical activity:    Days per week: Not on file    Minutes per session: Not on file  . Stress: Not on file  Relationships  . Social connections:    Talks on phone: Not on  file    Gets together: Not on file    Attends religious service: Not on file    Active member of club or organization: Not on file    Attends meetings of clubs or organizations: Not on file    Relationship status: Not on file  Other Topics Concern  . Not on file  Social History Narrative   Exercise; walking dog only    Family History  Problem Relation Age of Onset  . Arthritis Mother   . Arthritis Father   . Hyperlipidemia Other        parents  . Heart disease Other        parent, grandparents  . Breast cancer Maternal Aunt     Review of Systems  Constitutional: Negative for chills and fever.  Eyes: Negative for visual disturbance.  Respiratory: Negative for cough, shortness of breath and wheezing.   Cardiovascular: Positive for chest pain (one epsiode, occurred 3 weeks ago, left-sided, did not last long.  No recurrence.  No associated symptoms). Negative for palpitations and leg swelling.  Gastrointestinal: Positive for abdominal pain (occ in upper abdomen - feels sensitive). Negative for blood in stool (no black stools), constipation, diarrhea and nausea.       Gerd daily  Genitourinary: Negative for dysuria and hematuria.  Musculoskeletal: Positive for arthralgias (left hip, r shoulder).  Skin: Negative for color change and rash (gorin area - chronic - non-itchy).  Neurological: Positive for headaches. Negative for light-headedness.  Psychiatric/Behavioral: Negative for dysphoric mood. The patient is not nervous/anxious.        Objective:   Vitals:   10/19/18 0857  BP: (!) 142/82  Pulse: 76  Resp: 16  Temp: 97.6 F (36.4 C)  SpO2: 97%   Filed Weights   10/19/18 0857  Weight: 161 lb (73 kg)   Body mass index is 28.52 kg/m.  BP Readings from Last 3 Encounters:  10/19/18 (!) 142/82  04/20/18 (!) 142/80  03/17/18 (!) 150/74    Wt Readings from Last 3 Encounters:  10/19/18 161 lb (73 kg)  04/20/18 159 lb (72.1 kg)  03/17/18 159 lb (72.1 kg)      Physical Exam Constitutional: She appears well-developed and well-nourished. No distress.  HENT:  Head: Normocephalic and atraumatic.  Right Ear: External ear normal. Normal ear canal and TM Left Ear: External ear normal.  Normal ear canal and TM Mouth/Throat: Oropharynx is clear and moist.  Eyes: Conjunctivae and EOM are normal.  Neck: Neck supple. No tracheal deviation present. No thyromegaly present.  No carotid bruit  Cardiovascular: Normal rate, regular rhythm and normal heart sounds.   No murmur heard.  No edema. Pulmonary/Chest: Effort normal and breath sounds normal. No respiratory distress. She has no wheezes. She has no rales.  Breast: deferred to Gyn Abdominal: Soft. She exhibits no distension. There is no tenderness.  Lymphadenopathy: She has no cervical adenopathy.  Skin: Skin is warm and dry. She is not diaphoretic.  Psychiatric: She has a normal mood and affect. Her behavior is normal.        Assessment & Plan:   Physical exam: Screening blood work ordered Immunizations  Discussed shingrix, others up to date Colonoscopy  No longer needed due to age 35   Up to date  Dexa   Due - will schedule Eye exams   Due - will schedule  EKG      2015, repeat today due to episode of chest pain Exercise none - will try to start going to gym Weight   Good for age Skin   No concerns Substance abuse  none  See Problem List for Assessment and Plan of chronic medical problems.  FU in 6 months

## 2018-10-19 ENCOUNTER — Ambulatory Visit: Payer: Medicare Other | Admitting: Internal Medicine

## 2018-10-19 ENCOUNTER — Ambulatory Visit (INDEPENDENT_AMBULATORY_CARE_PROVIDER_SITE_OTHER): Payer: Medicare Other | Admitting: Internal Medicine

## 2018-10-19 ENCOUNTER — Other Ambulatory Visit (INDEPENDENT_AMBULATORY_CARE_PROVIDER_SITE_OTHER): Payer: Medicare Other

## 2018-10-19 ENCOUNTER — Ambulatory Visit (INDEPENDENT_AMBULATORY_CARE_PROVIDER_SITE_OTHER)
Admission: RE | Admit: 2018-10-19 | Discharge: 2018-10-19 | Disposition: A | Discharge status: Home / Self Care | Payer: Medicare Other | Source: Ambulatory Visit | Attending: Internal Medicine | Admitting: Internal Medicine

## 2018-10-19 ENCOUNTER — Encounter: Payer: Self-pay | Admitting: Internal Medicine

## 2018-10-19 VITALS — BP 142/82 | HR 76 | Temp 97.6°F | Resp 16 | Ht 63.0 in | Wt 161.0 lb

## 2018-10-19 DIAGNOSIS — M48061 Spinal stenosis, lumbar region without neurogenic claudication: Secondary | ICD-10-CM

## 2018-10-19 DIAGNOSIS — Z0000 Encounter for general adult medical examination without abnormal findings: Secondary | ICD-10-CM

## 2018-10-19 DIAGNOSIS — F419 Anxiety disorder, unspecified: Secondary | ICD-10-CM

## 2018-10-19 DIAGNOSIS — E7849 Other hyperlipidemia: Secondary | ICD-10-CM | POA: Diagnosis not present

## 2018-10-19 DIAGNOSIS — R079 Chest pain, unspecified: Secondary | ICD-10-CM

## 2018-10-19 DIAGNOSIS — R739 Hyperglycemia, unspecified: Secondary | ICD-10-CM

## 2018-10-19 DIAGNOSIS — E2839 Other primary ovarian failure: Secondary | ICD-10-CM

## 2018-10-19 DIAGNOSIS — G43809 Other migraine, not intractable, without status migrainosus: Secondary | ICD-10-CM

## 2018-10-19 DIAGNOSIS — G479 Sleep disorder, unspecified: Secondary | ICD-10-CM

## 2018-10-19 DIAGNOSIS — Z13820 Encounter for screening for osteoporosis: Secondary | ICD-10-CM

## 2018-10-19 DIAGNOSIS — I10 Essential (primary) hypertension: Secondary | ICD-10-CM | POA: Diagnosis not present

## 2018-10-19 DIAGNOSIS — F329 Major depressive disorder, single episode, unspecified: Secondary | ICD-10-CM

## 2018-10-19 DIAGNOSIS — K219 Gastro-esophageal reflux disease without esophagitis: Secondary | ICD-10-CM

## 2018-10-19 LAB — COMPREHENSIVE METABOLIC PANEL
ALT: 51 U/L — ABNORMAL HIGH (ref 0–35)
AST: 31 U/L (ref 0–37)
Albumin: 4.5 g/dL (ref 3.5–5.2)
Alkaline Phosphatase: 110 U/L (ref 39–117)
BUN: 19 mg/dL (ref 6–23)
CO2: 27 mEq/L (ref 19–32)
Calcium: 9.7 mg/dL (ref 8.4–10.5)
Chloride: 105 mEq/L (ref 96–112)
Creatinine, Ser: 0.99 mg/dL (ref 0.40–1.20)
GFR: 54.44 mL/min — AB (ref >60.00)
Glucose, Bld: 90 mg/dL (ref 70–99)
Potassium: 4.2 mEq/L (ref 3.5–5.1)
Sodium: 139 mEq/L (ref 135–145)
Total Bilirubin: 0.5 mg/dL (ref 0.2–1.2)
Total Protein: 6.9 g/dL (ref 6.0–8.3)

## 2018-10-19 LAB — CBC WITH DIFFERENTIAL/PLATELET
Basophils Absolute: 0 10*3/uL (ref 0.0–0.1)
Basophils Relative: 0.5 % (ref 0.0–3.0)
Eosinophils Absolute: 0.2 10*3/uL (ref 0.0–0.7)
Eosinophils Relative: 3 % (ref 0.0–5.0)
HCT: 42.6 % (ref 36.0–46.0)
Hemoglobin: 14.2 g/dL (ref 12.0–15.0)
Lymphocytes Relative: 27.9 % (ref 12.0–46.0)
Lymphs Abs: 2.2 10*3/uL (ref 0.7–4.0)
MCHC: 33.5 g/dL (ref 30.0–36.0)
MCV: 91.2 fl (ref 78.0–100.0)
Monocytes Absolute: 0.5 10*3/uL (ref 0.1–1.0)
Monocytes Relative: 6.7 % (ref 3.0–12.0)
NEUTROS PCT: 61.9 % (ref 43.0–77.0)
Neutro Abs: 4.8 10*3/uL (ref 1.4–7.7)
Platelets: 301 10*3/uL (ref 150.0–400.0)
RBC: 4.67 Mil/uL (ref 3.87–5.11)
RDW: 13.5 % (ref 11.5–15.5)
WBC: 7.8 10*3/uL (ref 4.0–10.5)

## 2018-10-19 LAB — HEMOGLOBIN A1C: HEMOGLOBIN A1C: 5.6 % (ref 4.6–6.5)

## 2018-10-19 LAB — LIPID PANEL
Cholesterol: 182 mg/dL (ref 0–200)
HDL: 51.3 mg/dL (ref >39.00)
LDL Cholesterol: 99 mg/dL (ref 0–99)
NonHDL: 130.84
Total CHOL/HDL Ratio: 4
Triglycerides: 158 mg/dL — ABNORMAL HIGH (ref 0.0–149.0)
VLDL: 31.6 mg/dL (ref 0.0–40.0)

## 2018-10-19 LAB — TSH: TSH: 2.34 u[IU]/mL (ref 0.35–4.50)

## 2018-10-19 MED ORDER — OMEPRAZOLE 20 MG PO CPDR: 20.0000 mg | DELAYED_RELEASE_CAPSULE | Freq: Every day | ORAL | 3 refills | Status: DC

## 2018-10-19 MED ORDER — TRAMADOL HCL 50 MG PO TABS: ORAL_TABLET | ORAL | 0 refills | Status: DC

## 2018-10-19 MED ORDER — RANITIDINE HCL 150 MG PO TABS: 150.0000 mg | ORAL_TABLET | Freq: Every day | ORAL | 3 refills | Status: DC

## 2018-10-19 NOTE — Assessment & Plan Note (Addendum)
Chronic Does not sleep well Try melatonin 1-2 milligrams

## 2018-10-19 NOTE — Assessment & Plan Note (Signed)
Not controlled Zantac not effective - continue at night only Start omeprazole 20 mg daily 30 min prior to breakfast Call if no improvement

## 2018-10-19 NOTE — Assessment & Plan Note (Signed)
She had one episode of left-sided chest pain that occurred 3 weeks ago-no recurrent episodes She states it did not feel like her GERD We will get an EKG ?  Musculoskeletal or atypical GERD since she is having GERD symptoms daily If chest pain recurs we will need to do a stress test/cardiology referral EKG today normal sinus rhythm at 71 bpm, old anterior infarct.  No change compared to prior EKG from 2015

## 2018-10-19 NOTE — Assessment & Plan Note (Signed)
reasonably controlled Continue current medication cmp

## 2018-10-19 NOTE — Assessment & Plan Note (Signed)
a1c

## 2018-10-19 NOTE — Assessment & Plan Note (Signed)
Continue clonazepam  controlled

## 2018-10-19 NOTE — Assessment & Plan Note (Signed)
Taking tramadol twice a day  - wants to increase it to

## 2018-10-19 NOTE — Assessment & Plan Note (Signed)
Check lipid panel  Continue daily statin Regular exercise and healthy diet encouraged  

## 2018-10-19 NOTE — Assessment & Plan Note (Signed)
Taking topamax daily imitrex prn - Rarely takes it Overall controlled  Continue above

## 2018-11-09 ENCOUNTER — Other Ambulatory Visit: Payer: Self-pay | Admitting: Internal Medicine

## 2018-11-09 NOTE — Telephone Encounter (Signed)
Last refill was 10/05/18 Last OV 04/20/2018 Next OV 04/20/19

## 2018-11-30 ENCOUNTER — Other Ambulatory Visit: Payer: Self-pay | Admitting: Internal Medicine

## 2018-11-30 DIAGNOSIS — Z1231 Encounter for screening mammogram for malignant neoplasm of breast: Secondary | ICD-10-CM

## 2018-12-11 ENCOUNTER — Other Ambulatory Visit: Payer: Self-pay | Admitting: Internal Medicine

## 2018-12-11 NOTE — Telephone Encounter (Signed)
[name] registry last filled 11/09/2018.Marland KitchenJohny Norris

## 2019-01-13 ENCOUNTER — Other Ambulatory Visit: Payer: Self-pay | Admitting: Internal Medicine

## 2019-01-15 ENCOUNTER — Other Ambulatory Visit: Payer: Self-pay | Admitting: Internal Medicine

## 2019-01-15 DIAGNOSIS — H26493 Other secondary cataract, bilateral: Secondary | ICD-10-CM | POA: Diagnosis not present

## 2019-01-15 DIAGNOSIS — H35373 Puckering of macula, bilateral: Secondary | ICD-10-CM | POA: Diagnosis not present

## 2019-01-15 NOTE — Telephone Encounter (Signed)
Last OV 10/19/2018 Next OV 04/20/2019  [place] Controlled Substance Database checked. Last filled on 12/11/18

## 2019-02-01 ENCOUNTER — Ambulatory Visit
Admission: RE | Admit: 2019-02-01 | Discharge: 2019-02-01 | Disposition: A | Discharge status: Home / Self Care | Payer: Medicare Other | Source: Ambulatory Visit | Attending: Internal Medicine | Admitting: Internal Medicine

## 2019-02-01 ENCOUNTER — Other Ambulatory Visit: Payer: Self-pay

## 2019-02-01 DIAGNOSIS — Z1231 Encounter for screening mammogram for malignant neoplasm of breast: Secondary | ICD-10-CM | POA: Diagnosis not present

## 2019-02-07 ENCOUNTER — Other Ambulatory Visit: Payer: Self-pay | Admitting: Internal Medicine

## 2019-02-13 ENCOUNTER — Other Ambulatory Visit: Payer: Self-pay | Admitting: Internal Medicine

## 2019-02-13 NOTE — Telephone Encounter (Signed)
[place] Controlled Database Checked Last filled: 01/16/19 # 90 LOV w/you: 10/19/18 Next appt w/you: 04/20/19

## 2019-03-16 ENCOUNTER — Other Ambulatory Visit: Payer: Self-pay | Admitting: Internal Medicine

## 2019-04-06 ENCOUNTER — Other Ambulatory Visit: Payer: Self-pay | Admitting: Internal Medicine

## 2019-04-14 NOTE — Progress Notes (Signed)
Subjective:    Patient ID: [name], female    DOB: 1941/03/22, 78 y.o.   MRN: [id number]  HPI The patient is here for follow up.  She is not exercising regularly.     Migraine headaches:  She takes topamax daily.  She takes imitrex as needed.  Her migraines are very well controlled.  She rarely has a migraine and has not needed to take Imitrex in a very long time.  Anxiety, depression, caregiver stress:  Her husband has dementia.  She is not taking any medication.  She states mostly stress more than anxiety or depression.  Last year we did start clonazepam at a very low dose twice a day, but she states she did not notice much change with this and is been no longer taking it.  Hypertension: She is taking her medication daily. She is compliant with a low sodium diet.  She has had a couple episodes of chest pain.  She is unsure if it was gas or what the cause was.  She states that it occurred at rest and lasted approximately 10 seconds.  She has not had any further episodes.  She denies palpitations, edema, shortness of breath and regular headaches.   Hyperlipidemia: She is taking her medication daily. She is compliant with a low fat/cholesterol diet. She denies myalgias.   hyperglycemia:  She is compliant with a low sugar/carbohydrate diet.  She is not exercising regularly.  GERD:  She is taking her medication daily as prescribed.  She denies any GERD symptoms and feels her GERD is well controlled.   Lumbar spinal stenosis:  She has chronic lower back pain.  The more she does the more pain she has.  She takes gabapentin daily.  She takes tramadol twice daily-2 in the morning and 1 at night.  Her pain has increased.    Left foot pain:  She has soreness on the dorsal aspect near the 4-5th metatarsal and lateral foot.     Medications and allergies reviewed with patient and updated if appropriate.  Patient Active Problem List   Diagnosis Date Noted  . Chest pain 10/19/2018  .  Hyperglycemia 10/18/2018  . Skin abnormality 03/17/2018  . 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 Squamous cell cancer of skin of hand 08/05/2015  . Difficulty sleeping 08/05/2015  . NONSPECIFIC ABNORMAL ELECTROCARDIOGRAM 09/15/2010  . GERD 01/09/2010  . DIVERTICULOSIS, COLON 01/09/2010  . Spinal stenosis of lumbar region 01/09/2010  . PLANTAR FASCIITIS 01/09/2010  . Hyperlipidemia 12/29/2009  . Anxiety and depression 12/29/2009  . Migraine headache 12/29/2009  . Essential hypertension 12/29/2009    Current Outpatient Medications on File Prior to Visit  Medication Sig Dispense Refill  . atorvastatin (LIPITOR) 20 MG tablet TAKE 1 TABLET BY MOUTH  DAILY 90 tablet 1  . Calcium Citrate-Vitamin D (CITRACAL PETITES/VITAMIN D) 200-250 MG-UNIT TABS Take by mouth 2 (two) times daily.      . Cholecalciferol (VITAMIN D3) 1000 UNITS CAPS Take by mouth daily.      . clonazePAM (KLONOPIN) 0.25 MG disintegrating tablet Take 1 tablet (0.25 mg total) by mouth 2 (two) times daily. 180 tablet 1  . Coenzyme Q10 (CO Q 10 PO) Take by mouth daily.    Marland Kitchen gabapentin (NEURONTIN) 300 MG capsule TAKE 1 CAPSULE BY MOUTH  TWICE DAILY 180 capsule 0  . losartan (COZAAR) 100 MG tablet TAKE 1 TABLET BY MOUTH  DAILY 90 tablet 0  . Multiple Vitamin (MULTIVITAMIN) tablet Take 1  tablet by mouth daily.      Marland Kitchen omeprazole (PRILOSEC) 20 MG capsule TAKE 1 CAPSULE BY MOUTH DAILY. TAKE 30 MINUTRES TO BREAKFAST 90 capsule 0  . SUMAtriptan (IMITREX) 100 MG tablet Take 1 tablet (100 mg total) by mouth as needed for migraine. May repeat in 2 hours if headache persists or recurs. 10 tablet 5  . topiramate (TOPAMAX) 100 MG tablet TAKE 1 TABLET BY MOUTH  DAILY 90 tablet 1  . traMADol (ULTRAM) 50 MG tablet TAKE 2 TABLETS BY MOUTH IN THE MORNING AND 1 TAB IN THE AFTERNOON AS NEEDED 90 tablet 0   No current facility-administered medications on file prior to visit.     Past Medical History:  Diagnosis Date  . DEPRESSION   .  DIVERTICULOSIS, COLON   . DYSLIPIDEMIA   . GERD   . HYPERTENSION   . MIGRAINE HEADACHE   . Multinodular goiter   . NONSPECIFIC ABNORMAL ELECTROCARDIOGRAM 09/15/2010   normal stress test 09/2010  . PLANTAR FASCIITIS   . SPINAL STENOSIS     Past Surgical History:  Procedure Laterality Date  . CATARACT EXTRACTION Left 07/05/13   [facility]  . CATARACT EXTRACTION  winter 2015  . child birth     x's 2 61 & 71  . TONSILLECTOMY      Social History   Socioeconomic History  . Marital status: Married    Spouse name: Not on file  . Number of children: Not on file  . Years of education: Not on file  . Highest education level: Not on file  Occupational History  . Not on file  Social Needs  . Financial resource strain: Not on file  . Food insecurity    Worry: Not on file    Inability: Not on file  . Transportation needs    Medical: Not on file    Non-medical: Not on file  Tobacco Use  . Smoking status: Never Smoker  . Smokeless tobacco: Never Used  . Tobacco comment: Married, lives with spouse and 2 sons involved with social activites with spouse at [employer] and church  Substance and Sexual Activity  . Alcohol use: No  . Drug use: No  . Sexual activity: Not on file  Lifestyle  . Physical activity    Days per week: Not on file    Minutes per session: Not on file  . Stress: Not on file  Relationships  . Social Herbalist on phone: Not on file    Gets together: Not on file    Attends religious service: Not on file    Active member of club or organization: Not on file    Attends meetings of clubs or organizations: Not on file    Relationship status: Not on file  Other Topics Concern  . Not on file  Social History Narrative   Exercise; walking dog only    Family History  Problem Relation Age of Onset  . Arthritis Mother   . Arthritis Father   . Hyperlipidemia Other        parents  . Heart disease Other        parent, grandparents  . Breast cancer  Maternal Aunt     Review of Systems  Constitutional: Negative for chills and fever.  Respiratory: Negative for cough, shortness of breath and wheezing.   Cardiovascular: Positive for chest pain (has had a couple of episodes - sitting, lasted 10 seconds). Negative for palpitations and leg swelling.  Neurological:  Positive for headaches (rare headaches). Negative for light-headedness.  Psychiatric/Behavioral: Positive for dysphoric mood (mild) and sleep disturbance (interrupted). The patient is nervous/anxious (mild).        Objective:   Vitals:   04/16/19 1102  BP: 138/78  Pulse: 71  Resp: 16  Temp: 98.1 F (36.7 C)  SpO2: 96%   BP Readings from Last 3 Encounters:  04/16/19 138/78  10/19/18 (!) 142/82  04/20/18 (!) 142/80   Wt Readings from Last 3 Encounters:  04/16/19 163 lb (73.9 kg)  10/19/18 161 lb (73 kg)  04/20/18 159 lb (72.1 kg)   Body mass index is 28.87 kg/m.   Physical Exam    Constitutional: Appears well-developed and well-nourished. No distress.  HENT:  Head: Normocephalic and atraumatic.  Neck: Neck supple. No tracheal deviation present. No thyromegaly present.  No cervical lymphadenopathy Cardiovascular: Normal rate, regular rhythm and normal heart sounds.   No murmur heard. No carotid bruit .  No edema Pulmonary/Chest: Effort normal and breath sounds normal. No respiratory distress. No has no wheezes. No rales. Musculoskeletal: Left foot- no pain on dorsal aspect-lateral aspect of foot with palpation Skin: Skin is warm and dry. Not diaphoretic.  Rash bilateral axilla and the decubital regions, red, peanut sized patch left forehead Psychiatric: Normal mood and affect. Behavior is normal.      Assessment & Plan:    See Problem List for Assessment and Plan of chronic medical problems.

## 2019-04-16 ENCOUNTER — Encounter: Payer: Self-pay | Admitting: Internal Medicine

## 2019-04-16 ENCOUNTER — Other Ambulatory Visit (INDEPENDENT_AMBULATORY_CARE_PROVIDER_SITE_OTHER): Payer: Medicare Other

## 2019-04-16 ENCOUNTER — Ambulatory Visit (INDEPENDENT_AMBULATORY_CARE_PROVIDER_SITE_OTHER): Payer: Medicare Other | Admitting: Internal Medicine

## 2019-04-16 ENCOUNTER — Other Ambulatory Visit: Payer: Self-pay

## 2019-04-16 VITALS — BP 138/78 | HR 71 | Temp 98.1°F | Resp 16 | Ht 63.0 in | Wt 163.0 lb

## 2019-04-16 DIAGNOSIS — F419 Anxiety disorder, unspecified: Secondary | ICD-10-CM

## 2019-04-16 DIAGNOSIS — Z23 Encounter for immunization: Secondary | ICD-10-CM

## 2019-04-16 DIAGNOSIS — M48061 Spinal stenosis, lumbar region without neurogenic claudication: Secondary | ICD-10-CM

## 2019-04-16 DIAGNOSIS — I10 Essential (primary) hypertension: Secondary | ICD-10-CM | POA: Diagnosis not present

## 2019-04-16 DIAGNOSIS — R739 Hyperglycemia, unspecified: Secondary | ICD-10-CM | POA: Diagnosis not present

## 2019-04-16 DIAGNOSIS — E7849 Other hyperlipidemia: Secondary | ICD-10-CM | POA: Diagnosis not present

## 2019-04-16 DIAGNOSIS — G43809 Other migraine, not intractable, without status migrainosus: Secondary | ICD-10-CM

## 2019-04-16 DIAGNOSIS — M79672 Pain in left foot: Secondary | ICD-10-CM

## 2019-04-16 DIAGNOSIS — L989 Disorder of the skin and subcutaneous tissue, unspecified: Secondary | ICD-10-CM

## 2019-04-16 DIAGNOSIS — R079 Chest pain, unspecified: Secondary | ICD-10-CM

## 2019-04-16 DIAGNOSIS — F329 Major depressive disorder, single episode, unspecified: Secondary | ICD-10-CM

## 2019-04-16 DIAGNOSIS — K219 Gastro-esophageal reflux disease without esophagitis: Secondary | ICD-10-CM | POA: Diagnosis not present

## 2019-04-16 LAB — COMPREHENSIVE METABOLIC PANEL
ALT: 21 U/L (ref 0–35)
AST: 20 U/L (ref 0–37)
Albumin: 4.5 g/dL (ref 3.5–5.2)
Alkaline Phosphatase: 107 U/L (ref 39–117)
BUN: 19 mg/dL (ref 6–23)
CO2: 27 mEq/L (ref 19–32)
Calcium: 10 mg/dL (ref 8.4–10.5)
Chloride: 105 mEq/L (ref 96–112)
Creatinine, Ser: 0.86 mg/dL (ref 0.40–1.20)
GFR: 63.96 mL/min (ref >60.00)
Glucose, Bld: 87 mg/dL (ref 70–99)
Potassium: 4.3 mEq/L (ref 3.5–5.1)
Sodium: 140 mEq/L (ref 135–145)
Total Bilirubin: 0.4 mg/dL (ref 0.2–1.2)
Total Protein: 7.4 g/dL (ref 6.0–8.3)

## 2019-04-16 LAB — LDL CHOLESTEROL, DIRECT: Direct LDL: 125 mg/dL

## 2019-04-16 LAB — LIPID PANEL
Cholesterol: 216 mg/dL — ABNORMAL HIGH (ref 0–200)
HDL: 40 mg/dL (ref >39.00)
NonHDL: 175.73
Total CHOL/HDL Ratio: 5
Triglycerides: 326 mg/dL — ABNORMAL HIGH (ref 0.0–149.0)
VLDL: 65.2 mg/dL — ABNORMAL HIGH (ref 0.0–40.0)

## 2019-04-16 LAB — HEMOGLOBIN A1C: Hgb A1c MFr Bld: 5.8 % (ref 4.6–6.5)

## 2019-04-16 MED ORDER — TRAMADOL HCL 50 MG PO TABS: 100.0000 mg | ORAL_TABLET | Freq: Two times a day (BID) | ORAL | 0 refills | Status: DC

## 2019-04-16 MED ORDER — ATORVASTATIN CALCIUM 20 MG PO TABS: 20.0000 mg | ORAL_TABLET | Freq: Every day | ORAL | 1 refills | Status: DC

## 2019-04-16 NOTE — Assessment & Plan Note (Signed)
Pain has increased Currently taking 2 tramadol in the morning and 1 in the evening-we will increase to 2 twice a day Advised her to take Tylenol when she takes the tramadol Continue gabapentin Follow-up in 6 months

## 2019-04-16 NOTE — Assessment & Plan Note (Signed)
Continues to have the rash in her axilla, antecubital regions and inner thighs-has seen dermatology in which she was prescribed was not effective-we will consider returning to dermatology.  Rash is asymptomatic Erythematous patch left forehead-advised seeing dermatology

## 2019-04-16 NOTE — Assessment & Plan Note (Signed)
Nontender with palpation Possibly arthritis versus radiculopathy from her spinal stenosis On gabapentin and tramadol Discussed that she can see a podiatrist-she will make an appointment She can also ask orthopedics regarding referred pain

## 2019-04-16 NOTE — Assessment & Plan Note (Signed)
Has had only a couple of episodes of left-sided chest pain that lasted only 10 seconds at most-not increasing in frequency or intensity and not related to activity Sounds very atypical She will monitor and if she continues to have episodes advised her to let me know so that we can refer her to cardiology

## 2019-04-16 NOTE — Assessment & Plan Note (Signed)
Taking topamax daily Takes imitrex, but rarely

## 2019-04-16 NOTE — Assessment & Plan Note (Signed)
She feels stressed more than anxious and depression She is no longer taking the clonazepam - it did not help Deferred SSRI - will consider one in the future

## 2019-04-16 NOTE — Assessment & Plan Note (Signed)
Check lipid panel, CMP Continue atorvastatin Encourage more regular exercise

## 2019-04-16 NOTE — Assessment & Plan Note (Signed)
A1c

## 2019-04-16 NOTE — Assessment & Plan Note (Signed)
BP controlled Current regimen effective and well tolerated Continue current medications at current doses cmp 

## 2019-04-16 NOTE — Assessment & Plan Note (Signed)
GERD controlled Continue daily medication  

## 2019-04-16 NOTE — Patient Instructions (Addendum)
  Tests ordered today. Your results will be released to [place] (or called to you) after review.  If any changes need to be made, you will be notified at that same time.   Flu immunization administered today.    Medications reviewed and updated.  Changes include :   Increase tramadol to 2 pills twice dail - take tylenol with this.    Your prescription(s) have been submitted to your pharmacy. Please take as directed and contact our office if you believe you are having problem(s) with the medication(s).   Please followup in 6 months    [facility].

## 2019-04-16 NOTE — Addendum Note (Signed)
Addended by: Delice Bison E on: 04/16/2019 12:58 PM   Modules accepted: Orders

## 2019-04-18 ENCOUNTER — Encounter: Payer: Self-pay | Admitting: Internal Medicine

## 2019-04-20 ENCOUNTER — Ambulatory Visit: Payer: Medicare Other | Admitting: Internal Medicine

## 2019-05-10 DIAGNOSIS — H35373 Puckering of macula, bilateral: Secondary | ICD-10-CM | POA: Diagnosis not present

## 2019-05-10 DIAGNOSIS — H26491 Other secondary cataract, right eye: Secondary | ICD-10-CM | POA: Diagnosis not present

## 2019-05-10 DIAGNOSIS — H524 Presbyopia: Secondary | ICD-10-CM | POA: Diagnosis not present

## 2019-05-28 ENCOUNTER — Other Ambulatory Visit: Payer: Self-pay

## 2019-05-28 NOTE — Patient Outreach (Signed)
[facility]) Care Management  05/28/2019  [name] Apr 26, 1941 [id number]   Medication Adherence call to [name] Compliant Voice message left with a call back number. [name] is showing past due on Losartan 100 mg under [place].   [place] Management Direct Dial [phone]  Fax [phone] [email or web address]

## 2019-06-04 ENCOUNTER — Telehealth: Payer: Self-pay | Admitting: Internal Medicine

## 2019-06-04 NOTE — Telephone Encounter (Signed)
[id number] [place], [place] [address]  Phone: [phone] Fax: [phone]   However Patient is needing Dr Quay Burow approval to get shot for a discounted price.    ... call back number [phone]

## 2019-06-04 NOTE — Telephone Encounter (Signed)
LVM for pt to call back in regards.  

## 2019-06-05 NOTE — Telephone Encounter (Addendum)
Spoke with pt, states insurance needs approval from provider to cover shingles vaccine. Will contact [facility] when [facility].

## 2019-06-05 NOTE — Telephone Encounter (Signed)
[facility]oke with [facility], states that the pts copay will be $109. They are not sure why the copay is different from the spouse. Advised to call insurance company. Tried contacting pt but got busy signal with 3 attempts.

## 2019-06-15 DIAGNOSIS — B360 Pityriasis versicolor: Secondary | ICD-10-CM | POA: Diagnosis not present

## 2019-06-15 DIAGNOSIS — L570 Actinic keratosis: Secondary | ICD-10-CM | POA: Diagnosis not present

## 2019-06-15 DIAGNOSIS — L309 Dermatitis, unspecified: Secondary | ICD-10-CM | POA: Diagnosis not present

## 2019-06-15 DIAGNOSIS — Z85828 Personal history of other malignant neoplasm of skin: Secondary | ICD-10-CM | POA: Diagnosis not present

## 2019-06-15 DIAGNOSIS — L920 Granuloma annulare: Secondary | ICD-10-CM | POA: Diagnosis not present

## 2019-06-21 DIAGNOSIS — H26492 Other secondary cataract, left eye: Secondary | ICD-10-CM | POA: Diagnosis not present

## 2019-06-22 DIAGNOSIS — H43813 Vitreous degeneration, bilateral: Secondary | ICD-10-CM | POA: Diagnosis not present

## 2019-06-22 DIAGNOSIS — H35373 Puckering of macula, bilateral: Secondary | ICD-10-CM | POA: Diagnosis not present

## 2019-06-22 DIAGNOSIS — H43393 Other vitreous opacities, bilateral: Secondary | ICD-10-CM | POA: Diagnosis not present

## 2019-07-10 ENCOUNTER — Other Ambulatory Visit: Payer: Self-pay | Admitting: Internal Medicine

## 2019-07-16 ENCOUNTER — Other Ambulatory Visit: Payer: Self-pay | Admitting: Internal Medicine

## 2019-07-16 NOTE — Telephone Encounter (Signed)
[place] Controlled Database Checked Last filled: 06/14/19 # 120 LOV w/you: 04/16/19 Next appt w/you: 06/14/19 # 120

## 2019-07-25 ENCOUNTER — Other Ambulatory Visit: Payer: Self-pay

## 2019-07-25 NOTE — Patient Outreach (Signed)
[facility]) Care Management  07/25/2019  [name] 1940/11/20 [id number]   Medication Adherence call to [name] Compliant Voice message left with a call back number. [name] is showing past due on Atorvastatin 20 mg under [place].   [place] Management Direct Dial [phone]  Fax [phone] [email or web address]

## 2019-07-27 DIAGNOSIS — L920 Granuloma annulare: Secondary | ICD-10-CM | POA: Diagnosis not present

## 2019-07-27 DIAGNOSIS — Z85828 Personal history of other malignant neoplasm of skin: Secondary | ICD-10-CM | POA: Diagnosis not present

## 2019-08-01 ENCOUNTER — Other Ambulatory Visit: Payer: Self-pay

## 2019-08-01 NOTE — Patient Outreach (Signed)
[facility]) Care Management  08/01/2019  [name] [birth year] [id number]   Medication Adherence call to [name] Compliant Voice message left with a call back number. [name] is showing past due on Atorvastatin 20 mg under [place].  [place] Management Direct Dial [phone]  Fax [phone] [email or web address]

## 2019-09-14 ENCOUNTER — Other Ambulatory Visit: Payer: Self-pay | Admitting: Internal Medicine

## 2019-10-01 DIAGNOSIS — M545 Low back pain: Secondary | ICD-10-CM | POA: Diagnosis not present

## 2019-10-01 DIAGNOSIS — M5106 Intervertebral disc disorders with myelopathy, lumbar region: Secondary | ICD-10-CM | POA: Diagnosis not present

## 2019-10-01 DIAGNOSIS — M170 Bilateral primary osteoarthritis of knee: Secondary | ICD-10-CM | POA: Diagnosis not present

## 2019-10-01 DIAGNOSIS — M25561 Pain in right knee: Secondary | ICD-10-CM | POA: Diagnosis not present

## 2019-10-01 DIAGNOSIS — M47816 Spondylosis without myelopathy or radiculopathy, lumbar region: Secondary | ICD-10-CM | POA: Diagnosis not present

## 2019-10-01 DIAGNOSIS — M1711 Unilateral primary osteoarthritis, right knee: Secondary | ICD-10-CM | POA: Diagnosis not present

## 2019-10-15 ENCOUNTER — Other Ambulatory Visit: Payer: Self-pay | Admitting: Internal Medicine

## 2019-10-16 ENCOUNTER — Telehealth: Payer: Self-pay

## 2019-10-16 MED ORDER — TRAMADOL HCL 50 MG PO TABS: 100.0000 mg | ORAL_TABLET | Freq: Two times a day (BID) | ORAL | 2 refills | Status: DC

## 2019-10-16 NOTE — Telephone Encounter (Signed)
1.Medication Requested: traMADol (ULTRAM) 50 MG tablet   2. Pharmacy (Name, Street, [place] [address]   3. On Med List: Yes   4. Last Visit with PCP: 9.24.20   5. Next visit date with PCP:  3.11.2021

## 2019-10-16 NOTE — Telephone Encounter (Signed)
Last OV 04/16/19 Next OV 10/22/19 Last RF 09/14/1919

## 2019-10-21 NOTE — Progress Notes (Signed)
Subjective:    Patient ID: [name], female    DOB: 09/03/1940, 79 y.o.   MRN: [id number]  HPI She is here for a physical exam.   The only change since she was here last when she was placed on hydroxychloroquine by her dermatologist.  She denies other changes.  She has no major concerns.  Her husband has dementia and his health has declined over the past 6 months.  She is still able to leave the home for short periods of time.  Medications and allergies reviewed with patient and updated if appropriate.  Patient Active Problem List   Diagnosis Date Noted  . Lichen planus [id number]  . Left foot pain 04/16/2019  . Chest pain 10/19/2018  . Prediabetes 10/18/2018  . Skin abnormality 03/17/2018  . 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 Squamous cell cancer of skin of hand 08/05/2015  . Difficulty sleeping 08/05/2015  . NONSPECIFIC ABNORMAL ELECTROCARDIOGRAM 09/15/2010  . GERD 01/09/2010  . DIVERTICULOSIS, COLON 01/09/2010  . Spinal stenosis of lumbar region 01/09/2010  . Hyperlipidemia 12/29/2009  . Anxiety and depression 12/29/2009  . Migraine headache 12/29/2009  . Essential hypertension 12/29/2009    Current Outpatient Medications on File Prior to Visit  Medication Sig Dispense Refill  . atorvastatin (LIPITOR) 20 MG tablet Take 1 tablet (20 mg total) by mouth daily. 90 tablet 1  . Calcium Citrate-Vitamin D (CITRACAL PETITES/VITAMIN D) 200-250 MG-UNIT TABS Take by mouth 2 (two) times daily.      . Cholecalciferol (VITAMIN D3) 1000 UNITS CAPS Take by mouth daily.      . Coenzyme Q10 (CO Q 10 PO) Take by mouth daily.    Marland Kitchen gabapentin (NEURONTIN) 300 MG capsule TAKE 1 CAPSULE BY MOUTH  TWICE DAILY 180 capsule 2  . losartan (COZAAR) 100 MG tablet TAKE 1 TABLET BY MOUTH  DAILY 90 tablet 0  . Multiple Vitamin (MULTIVITAMIN) tablet Take 1 tablet by mouth daily.      Marland Kitchen omeprazole (PRILOSEC) 20 MG capsule TAKE 1 CAPSULE BY MOUTH DAILY. TAKE 30 MINUTRES TO BREAKFAST 90 capsule 0   . SUMAtriptan (IMITREX) 100 MG tablet Take 1 tablet (100 mg total) by mouth as needed for migraine. May repeat in 2 hours if headache persists or recurs. 10 tablet 5  . traMADol (ULTRAM) 50 MG tablet Take 2 tablets (100 mg total) by mouth 2 (two) times daily. 120 tablet 2  . hydroxychloroquine (PLAQUENIL) 200 MG tablet Take 200 mg by mouth 2 (two) times daily.     No current facility-administered medications on file prior to visit.    Past Medical History:  Diagnosis Date  . DEPRESSION   . DIVERTICULOSIS, COLON   . DYSLIPIDEMIA   . GERD   . HYPERTENSION   . MIGRAINE HEADACHE   . Multinodular goiter   . NONSPECIFIC ABNORMAL ELECTROCARDIOGRAM 09/15/2010   normal stress test 09/2010  . PLANTAR FASCIITIS   . SPINAL STENOSIS     Past Surgical History:  Procedure Laterality Date  . CATARACT EXTRACTION Left 07/05/13   [facility]  . CATARACT EXTRACTION  winter 2015  . child birth     x's 2 37 & 22  . TONSILLECTOMY      Social History   Socioeconomic History  . Marital status: Married    Spouse name: Not on file  . Number of children: Not on file  . Years of education: Not on file  . Highest education level: Not on file  Occupational  History  . Not on file  Tobacco Use  . Smoking status: Never Smoker  . Smokeless tobacco: Never Used  . Tobacco comment: Married, lives with spouse and 2 sons involved with social activites with spouse at [employer] and church  Substance and Sexual Activity  . Alcohol use: No  . Drug use: No  . Sexual activity: Not on file  Other Topics Concern  . Not on file  Social History Narrative   Exercise; walking dog only   Social Determinants of Health   Financial Resource Strain:   . Difficulty of Paying Living Expenses: Not on file  Food Insecurity:   . Worried About Charity fundraiser in the Last Year: Not on file  . Ran Out of Food in the Last Year: Not on file  Transportation Needs:   . Lack of Transportation (Medical): Not on file  .  Lack of Transportation (Non-Medical): Not on file  Physical Activity:   . Days of Exercise per Week: Not on file  . Minutes of Exercise per Session: Not on file  Stress:   . Feeling of Stress : Not on file  Social Connections:   . Frequency of Communication with Friends and Family: Not on file  . Frequency of Social Gatherings with Friends and Family: Not on file  . Attends Religious Services: Not on file  . Active Member of Clubs or Organizations: Not on file  . Attends Archivist Meetings: Not on file  . Marital Status: Not on file    Family History  Problem Relation Age of Onset  . Arthritis Mother   . Arthritis Father   . Hyperlipidemia Other        parents  . Heart disease Other        parent, grandparents  . Breast cancer Maternal Aunt     Review of Systems  Constitutional: Negative for chills and fever.  Eyes: Negative for visual disturbance.  Respiratory: Negative for cough, shortness of breath and wheezing.   Cardiovascular: Negative for chest pain, palpitations and leg swelling.  Gastrointestinal: Negative for abdominal pain, blood in stool (no black stool), constipation, diarrhea and nausea.       GERD a lot -most days  Genitourinary: Negative for dysuria and hematuria.  Musculoskeletal: Positive for arthralgias (right knee) and back pain.  Skin: Negative for rash.  Neurological: Negative for light-headedness and headaches.  Psychiatric/Behavioral: Positive for dysphoric mood (Some days) and sleep disturbance. The patient is nervous/anxious (some days).        Objective:   Vitals:   10/22/19 1110  BP: (!) 144/72  Pulse: 67  Resp: 16  Temp: 98.3 F (36.8 C)  SpO2: 97%   Filed Weights   10/22/19 1110  Weight: 161 lb 12.8 oz (73.4 kg)   Body mass index is 28.66 kg/m.  BP Readings from Last 3 Encounters:  10/22/19 (!) 144/72  04/16/19 138/78  10/19/18 (!) 142/82    Wt Readings from Last 3 Encounters:  10/22/19 161 lb 12.8 oz (73.4 kg)   04/16/19 163 lb (73.9 kg)  10/19/18 161 lb (73 kg)     Physical Exam Constitutional: She appears well-developed and well-nourished. No distress.  HENT:  Head: Normocephalic and atraumatic.  Right Ear: External ear normal. Normal ear canal and TM Left Ear: External ear normal.  Normal ear canal and TM Mouth/Throat: Oropharynx is clear and moist.  Eyes: Conjunctivae and EOM are normal.  Neck: Neck supple. No tracheal deviation present. No  thyromegaly present. No carotid bruit  Cardiovascular: Normal rate, regular rhythm and normal heart sounds.  No murmur heard.  No edema. Pulmonary/Chest: Effort normal and breath sounds normal. No respiratory distress. She has no wheezes. She has no rales.  Breast: deferred   Abdominal: Soft. She exhibits no distension. There is no tenderness.  Lymphadenopathy: She has no cervical adenopathy.  Skin: Skin is warm and dry. She is not diaphoretic.  Psychiatric: She has a normal mood and affect. Her behavior is normal.        Assessment & Plan:   Physical exam: Screening blood work    ordered Immunizations discussed Shingrix, had Covid vaccines, others up-to-date Colonoscopy-no longer needed due to age Mammogram up-to-date-June 2020 Dexa   up-to-date Eye exams up-to-date Exercise  Active, no regular exercise Weight BMI good for age Substance abuse   none Sees dermatology-visits up-to-date  See Problem List for Assessment and Plan of chronic medical problems.      This visit occurred during the SARS-CoV-2 public health emergency.  Safety protocols were in place, including screening questions prior to the visit, additional usage of staff PPE, and extensive cleaning of exam room while observing appropriate contact time as indicated for disinfecting solutions.

## 2019-10-21 NOTE — Patient Instructions (Addendum)
Blood work was ordered.    All other Health Maintenance issues reviewed.   All recommended immunizations and age-appropriate screenings are up-to-date or discussed.  No immunization administered today.   Medications reviewed and updated.  Changes include :   Stop topamax    Please followup in 6 months    Health Maintenance, Female Adopting a healthy lifestyle and getting preventive care are important in promoting health and wellness. Ask your health care provider about:  The right schedule for you to have regular tests and exams.  Things you can do on your own to prevent diseases and keep yourself healthy. What should I know about diet, weight, and exercise? Eat a healthy diet   Eat a diet that includes plenty of vegetables, fruits, low-fat dairy products, and lean protein.  Do not eat a lot of foods that are high in solid fats, added sugars, or sodium. Maintain a healthy weight Body mass index (BMI) is used to identify weight problems. It estimates body fat based on height and weight. Your health care provider can help determine your BMI and help you achieve or maintain a healthy weight. Get regular exercise Get regular exercise. This is one of the most important things you can do for your health. Most adults should:  Exercise for at least 150 minutes each week. The exercise should increase your heart rate and make you sweat (moderate-intensity exercise).  Do strengthening exercises at least twice a week. This is in addition to the moderate-intensity exercise.  Spend less time sitting. Even light physical activity can be beneficial. Watch cholesterol and blood lipids Have your blood tested for lipids and cholesterol at 78 years of age, then have this test every 5 years. Have your cholesterol levels checked more often if:  Your lipid or cholesterol levels are high.  You are older than 78 years of age.  You are at high risk for heart disease. What should I know about  cancer screening? Depending on your health history and family history, you may need to have cancer screening at various ages. This may include screening for:  Breast cancer.  Cervical cancer.  Colorectal cancer.  Skin cancer.  Lung cancer. What should I know about heart disease, diabetes, and high blood pressure? Blood pressure and heart disease  High blood pressure causes heart disease and increases the risk of stroke. This is more likely to develop in people who have high blood pressure readings, are of African descent, or are overweight.  Have your blood pressure checked: ? Every 3-5 years if you are 73-23 years of age. ? Every year if you are 54 years old or older. Diabetes Have regular diabetes screenings. This checks your fasting blood sugar level. Have the screening done:  Once every three years after age 70 if you are at a normal weight and have a low risk for diabetes.  More often and at a younger age if you are overweight or have a high risk for diabetes. What should I know about preventing infection? Hepatitis B If you have a higher risk for hepatitis B, you should be screened for this virus. Talk with your health care provider to find out if you are at risk for hepatitis B infection. Hepatitis C Testing is recommended for:  Everyone born from [birth year] through 1964.  Anyone with known risk factors for hepatitis C. Sexually transmitted infections (STIs)  Get screened for STIs, including gonorrhea and chlamydia, if: ? You are sexually active and are younger than 78 years  of age. ? You are older than 78 years of age and your health care provider tells you that you are at risk for this type of infection. ? Your sexual activity has changed since you were last screened, and you are at increased risk for chlamydia or gonorrhea. Ask your health care provider if you are at risk.  Ask your health care provider about whether you are at high risk for HIV. Your health care  provider may recommend a prescription medicine to help prevent HIV infection. If you choose to take medicine to prevent HIV, you should first get tested for HIV. You should then be tested every 3 months for as long as you are taking the medicine. Pregnancy  If you are about to stop having your period (premenopausal) and you may become pregnant, seek counseling before you get pregnant.  Take 400 to 800 micrograms (mcg) of folic acid every day if you become pregnant.  Ask for birth control (contraception) if you want to prevent pregnancy. Osteoporosis and menopause Osteoporosis is a disease in which the bones lose minerals and strength with aging. This can result in bone fractures. If you are 81 years old or older, or if you are at risk for osteoporosis and fractures, ask your health care provider if you should:  Be screened for bone loss.  Take a calcium or vitamin D supplement to lower your risk of fractures.  Be given hormone replacement therapy (HRT) to treat symptoms of menopause. Follow these instructions at home: Lifestyle  Do not use any products that contain nicotine or tobacco, such as cigarettes, e-cigarettes, and chewing tobacco. If you need help quitting, ask your health care provider.  Do not use street drugs.  Do not share needles.  Ask your health care provider for help if you need support or information about quitting drugs. Alcohol use  Do not drink alcohol if: ? Your health care provider tells you not to drink. ? You are pregnant, may be pregnant, or are planning to become pregnant.  If you drink alcohol: ? Limit how much you use to 0-1 drink a day. ? Limit intake if you are breastfeeding.  Be aware of how much alcohol is in your drink. In the [place] one drink equals one 12 oz bottle of beer (355 mL), one 5 oz glass of wine (148 mL), or one 1 oz glass of hard liquor (44 mL). General instructions  Schedule regular health, dental, and eye exams.  Stay current  with your vaccines.  Tell your health care provider if: ? You often feel depressed. ? You have ever been abused or do not feel safe at home. Summary  Adopting a healthy lifestyle and getting preventive care are important in promoting health and wellness.  Follow your health care provider's instructions about healthy diet, exercising, and getting tested or screened for diseases.  Follow your health care provider's instructions on monitoring your cholesterol and blood pressure. This information is not intended to replace advice given to you by your health care provider. Make sure you discuss any questions you have with your health care provider. Document Revised: 07/26/2018 Document Reviewed: 07/26/2018 Elsevier Patient Education  2020 [employer].

## 2019-10-22 ENCOUNTER — Other Ambulatory Visit: Payer: Self-pay

## 2019-10-22 ENCOUNTER — Ambulatory Visit (INDEPENDENT_AMBULATORY_CARE_PROVIDER_SITE_OTHER): Payer: Medicare Other | Admitting: Internal Medicine

## 2019-10-22 ENCOUNTER — Encounter: Payer: Self-pay | Admitting: Internal Medicine

## 2019-10-22 VITALS — BP 144/72 | HR 67 | Temp 98.3°F | Resp 16 | Ht 63.0 in | Wt 161.8 lb

## 2019-10-22 DIAGNOSIS — E7849 Other hyperlipidemia: Secondary | ICD-10-CM | POA: Diagnosis not present

## 2019-10-22 DIAGNOSIS — L439 Lichen planus, unspecified: Secondary | ICD-10-CM | POA: Insufficient documentation

## 2019-10-22 DIAGNOSIS — K219 Gastro-esophageal reflux disease without esophagitis: Secondary | ICD-10-CM

## 2019-10-22 DIAGNOSIS — R7303 Prediabetes: Secondary | ICD-10-CM | POA: Diagnosis not present

## 2019-10-22 DIAGNOSIS — I10 Essential (primary) hypertension: Secondary | ICD-10-CM | POA: Diagnosis not present

## 2019-10-22 DIAGNOSIS — Z0000 Encounter for general adult medical examination without abnormal findings: Secondary | ICD-10-CM

## 2019-10-22 DIAGNOSIS — F32A Depression, unspecified: Secondary | ICD-10-CM

## 2019-10-22 DIAGNOSIS — G43809 Other migraine, not intractable, without status migrainosus: Secondary | ICD-10-CM

## 2019-10-22 DIAGNOSIS — F329 Major depressive disorder, single episode, unspecified: Secondary | ICD-10-CM

## 2019-10-22 DIAGNOSIS — M48061 Spinal stenosis, lumbar region without neurogenic claudication: Secondary | ICD-10-CM

## 2019-10-22 DIAGNOSIS — F419 Anxiety disorder, unspecified: Secondary | ICD-10-CM

## 2019-10-22 LAB — CBC WITH DIFFERENTIAL/PLATELET
Basophils Absolute: 0 10*3/uL (ref 0.0–0.1)
Basophils Relative: 0.7 % (ref 0.0–3.0)
Eosinophils Absolute: 0.3 10*3/uL (ref 0.0–0.7)
Eosinophils Relative: 4.4 % (ref 0.0–5.0)
HCT: 40.9 % (ref 36.0–46.0)
Hemoglobin: 13.8 g/dL (ref 12.0–15.0)
Lymphocytes Relative: 31.7 % (ref 12.0–46.0)
Lymphs Abs: 1.9 10*3/uL (ref 0.7–4.0)
MCHC: 33.6 g/dL (ref 30.0–36.0)
MCV: 90.8 fl (ref 78.0–100.0)
Monocytes Absolute: 0.4 10*3/uL (ref 0.1–1.0)
Monocytes Relative: 6.2 % (ref 3.0–12.0)
Neutro Abs: 3.5 10*3/uL (ref 1.4–7.7)
Neutrophils Relative %: 57 % (ref 43.0–77.0)
Platelets: 306 10*3/uL (ref 150.0–400.0)
RBC: 4.51 Mil/uL (ref 3.87–5.11)
RDW: 13.1 % (ref 11.5–15.5)
WBC: 6.1 10*3/uL (ref 4.0–10.5)

## 2019-10-22 LAB — COMPREHENSIVE METABOLIC PANEL
ALT: 19 U/L (ref 0–35)
AST: 17 U/L (ref 0–37)
Albumin: 4.3 g/dL (ref 3.5–5.2)
Alkaline Phosphatase: 102 U/L (ref 39–117)
BUN: 20 mg/dL (ref 6–23)
CO2: 28 mEq/L (ref 19–32)
Calcium: 9.4 mg/dL (ref 8.4–10.5)
Chloride: 104 mEq/L (ref 96–112)
Creatinine, Ser: 0.91 mg/dL (ref 0.40–1.20)
GFR: 59.84 mL/min — ABNORMAL LOW (ref >60.00)
Glucose, Bld: 91 mg/dL (ref 70–99)
Potassium: 4.2 mEq/L (ref 3.5–5.1)
Sodium: 139 mEq/L (ref 135–145)
Total Bilirubin: 0.5 mg/dL (ref 0.2–1.2)
Total Protein: 7.4 g/dL (ref 6.0–8.3)

## 2019-10-22 LAB — LDL CHOLESTEROL, DIRECT: Direct LDL: 106 mg/dL

## 2019-10-22 LAB — LIPID PANEL
Cholesterol: 180 mg/dL (ref 0–200)
HDL: 38.2 mg/dL — ABNORMAL LOW (ref >39.00)
NonHDL: 141.76
Total CHOL/HDL Ratio: 5
Triglycerides: 238 mg/dL — ABNORMAL HIGH (ref 0.0–149.0)
VLDL: 47.6 mg/dL — ABNORMAL HIGH (ref 0.0–40.0)

## 2019-10-22 LAB — HEMOGLOBIN A1C: Hgb A1c MFr Bld: 5.6 % (ref 4.6–6.5)

## 2019-10-22 LAB — TSH: TSH: 1.59 u[IU]/mL (ref 0.35–4.50)

## 2019-10-22 NOTE — Assessment & Plan Note (Addendum)
chronic Not taking topamax daily - taking it as needed Denies any migraines Stop the topamax Use imitrex as needed

## 2019-10-22 NOTE — Assessment & Plan Note (Signed)
Chronic Check a1c Low sugar / carb diet Stressed regular exercise  

## 2019-10-22 NOTE — Assessment & Plan Note (Signed)
Chronic, intermittent She has some depression and anxiety at times, but nothing consistent I think most of her stress or anxiety is related to her husband's dementia that is progressing She is not currently on any medication-we will monitor off medication

## 2019-10-22 NOTE — Assessment & Plan Note (Signed)
Chronic Check lipid panel  Continue daily statin Regular exercise and healthy diet encouraged  

## 2019-10-22 NOTE — Assessment & Plan Note (Signed)
Not currently taking omeprazole because she read that she could not take this with hydroxychloroquine Has been experiencing GERD about 5 days a week-has taken some Tums states that she should ask her pharmacist but if she can take the omeprazole not-I think it may be okay Discussed that we need to get her heartburn controlled to prevent any damage to her stomach or esophagus She will call with any questions or concerns

## 2019-10-22 NOTE — Assessment & Plan Note (Signed)
Chronic Taking tramadol and gabapentin Pain is tolerable Continue above

## 2019-10-22 NOTE — Assessment & Plan Note (Signed)
Chronic BP well controlled Current regimen effective and well tolerated Continue current medications at current doses cmp  

## 2019-11-01 ENCOUNTER — Other Ambulatory Visit: Payer: Self-pay | Admitting: Internal Medicine

## 2019-12-10 ENCOUNTER — Other Ambulatory Visit: Payer: Self-pay | Admitting: Internal Medicine

## 2019-12-25 ENCOUNTER — Other Ambulatory Visit: Payer: Self-pay | Admitting: Internal Medicine

## 2019-12-25 DIAGNOSIS — Z85828 Personal history of other malignant neoplasm of skin: Secondary | ICD-10-CM | POA: Diagnosis not present

## 2019-12-25 DIAGNOSIS — D485 Neoplasm of uncertain behavior of skin: Secondary | ICD-10-CM | POA: Diagnosis not present

## 2019-12-25 DIAGNOSIS — Z1231 Encounter for screening mammogram for malignant neoplasm of breast: Secondary | ICD-10-CM

## 2019-12-25 DIAGNOSIS — D0462 Carcinoma in situ of skin of left upper limb, including shoulder: Secondary | ICD-10-CM | POA: Diagnosis not present

## 2019-12-25 DIAGNOSIS — B360 Pityriasis versicolor: Secondary | ICD-10-CM | POA: Diagnosis not present

## 2019-12-25 DIAGNOSIS — L920 Granuloma annulare: Secondary | ICD-10-CM | POA: Diagnosis not present

## 2020-01-16 ENCOUNTER — Other Ambulatory Visit: Payer: Self-pay | Admitting: Internal Medicine

## 2020-01-17 NOTE — Telephone Encounter (Signed)
Last RF 12/16/19 Last OV 10/22/19 Next OV 04/24/20

## 2020-02-04 ENCOUNTER — Ambulatory Visit
Admission: RE | Admit: 2020-02-04 | Discharge: 2020-02-04 | Disposition: A | Discharge status: Home / Self Care | Payer: Medicare Other | Source: Ambulatory Visit | Attending: Internal Medicine | Admitting: Internal Medicine

## 2020-02-04 ENCOUNTER — Other Ambulatory Visit: Payer: Self-pay

## 2020-02-04 DIAGNOSIS — Z1231 Encounter for screening mammogram for malignant neoplasm of breast: Secondary | ICD-10-CM

## 2020-03-04 ENCOUNTER — Other Ambulatory Visit: Payer: Self-pay | Admitting: Internal Medicine

## 2020-03-30 ENCOUNTER — Other Ambulatory Visit: Payer: Self-pay | Admitting: Internal Medicine

## 2020-04-09 ENCOUNTER — Other Ambulatory Visit: Payer: Self-pay

## 2020-04-09 ENCOUNTER — Emergency Department (HOSPITAL_COMMUNITY): Payer: Medicare Other

## 2020-04-09 ENCOUNTER — Inpatient Hospital Stay (HOSPITAL_COMMUNITY)
Admission: EM | Admit: 2020-04-09 | Discharge: 2020-05-16 | DRG: 177 | Disposition: E | Payer: Medicare Other | Attending: Internal Medicine | Admitting: Internal Medicine

## 2020-04-09 ENCOUNTER — Encounter (HOSPITAL_COMMUNITY): Payer: Self-pay

## 2020-04-09 DIAGNOSIS — K760 Fatty (change of) liver, not elsewhere classified: Secondary | ICD-10-CM | POA: Diagnosis not present

## 2020-04-09 DIAGNOSIS — I10 Essential (primary) hypertension: Secondary | ICD-10-CM | POA: Diagnosis present

## 2020-04-09 DIAGNOSIS — K219 Gastro-esophageal reflux disease without esophagitis: Secondary | ICD-10-CM | POA: Diagnosis present

## 2020-04-09 DIAGNOSIS — R0689 Other abnormalities of breathing: Secondary | ICD-10-CM | POA: Diagnosis not present

## 2020-04-09 DIAGNOSIS — Z743 Need for continuous supervision: Secondary | ICD-10-CM | POA: Diagnosis not present

## 2020-04-09 DIAGNOSIS — R0602 Shortness of breath: Secondary | ICD-10-CM | POA: Diagnosis not present

## 2020-04-09 DIAGNOSIS — Z791 Long term (current) use of non-steroidal anti-inflammatories (NSAID): Secondary | ICD-10-CM

## 2020-04-09 DIAGNOSIS — U071 COVID-19: Secondary | ICD-10-CM | POA: Diagnosis present

## 2020-04-09 DIAGNOSIS — R109 Unspecified abdominal pain: Secondary | ICD-10-CM

## 2020-04-09 DIAGNOSIS — Z7189 Other specified counseling: Secondary | ICD-10-CM

## 2020-04-09 DIAGNOSIS — R748 Abnormal levels of other serum enzymes: Secondary | ICD-10-CM

## 2020-04-09 DIAGNOSIS — J1282 Pneumonia due to coronavirus disease 2019: Secondary | ICD-10-CM | POA: Diagnosis present

## 2020-04-09 DIAGNOSIS — R339 Retention of urine, unspecified: Secondary | ICD-10-CM | POA: Diagnosis not present

## 2020-04-09 DIAGNOSIS — E785 Hyperlipidemia, unspecified: Secondary | ICD-10-CM | POA: Diagnosis present

## 2020-04-09 DIAGNOSIS — I700 Atherosclerosis of aorta: Secondary | ICD-10-CM | POA: Diagnosis not present

## 2020-04-09 DIAGNOSIS — Z79899 Other long term (current) drug therapy: Secondary | ICD-10-CM | POA: Diagnosis not present

## 2020-04-09 DIAGNOSIS — L920 Granuloma annulare: Secondary | ICD-10-CM | POA: Diagnosis present

## 2020-04-09 DIAGNOSIS — R7989 Other specified abnormal findings of blood chemistry: Secondary | ICD-10-CM | POA: Diagnosis present

## 2020-04-09 DIAGNOSIS — F329 Major depressive disorder, single episode, unspecified: Secondary | ICD-10-CM | POA: Diagnosis present

## 2020-04-09 DIAGNOSIS — M549 Dorsalgia, unspecified: Secondary | ICD-10-CM | POA: Diagnosis not present

## 2020-04-09 DIAGNOSIS — Z8261 Family history of arthritis: Secondary | ICD-10-CM

## 2020-04-09 DIAGNOSIS — K5900 Constipation, unspecified: Secondary | ICD-10-CM | POA: Diagnosis not present

## 2020-04-09 DIAGNOSIS — N1330 Unspecified hydronephrosis: Secondary | ICD-10-CM | POA: Diagnosis not present

## 2020-04-09 DIAGNOSIS — G43809 Other migraine, not intractable, without status migrainosus: Secondary | ICD-10-CM | POA: Diagnosis not present

## 2020-04-09 DIAGNOSIS — F419 Anxiety disorder, unspecified: Secondary | ICD-10-CM | POA: Diagnosis present

## 2020-04-09 DIAGNOSIS — R064 Hyperventilation: Secondary | ICD-10-CM | POA: Diagnosis not present

## 2020-04-09 DIAGNOSIS — E7849 Other hyperlipidemia: Secondary | ICD-10-CM | POA: Diagnosis not present

## 2020-04-09 DIAGNOSIS — R1013 Epigastric pain: Secondary | ICD-10-CM | POA: Diagnosis not present

## 2020-04-09 DIAGNOSIS — G43909 Migraine, unspecified, not intractable, without status migrainosus: Secondary | ICD-10-CM | POA: Diagnosis not present

## 2020-04-09 DIAGNOSIS — D84821 Immunodeficiency due to drugs: Secondary | ICD-10-CM | POA: Diagnosis present

## 2020-04-09 DIAGNOSIS — J9601 Acute respiratory failure with hypoxia: Secondary | ICD-10-CM

## 2020-04-09 DIAGNOSIS — Z66 Do not resuscitate: Secondary | ICD-10-CM | POA: Diagnosis not present

## 2020-04-09 DIAGNOSIS — Z803 Family history of malignant neoplasm of breast: Secondary | ICD-10-CM

## 2020-04-09 DIAGNOSIS — Z515 Encounter for palliative care: Secondary | ICD-10-CM

## 2020-04-09 DIAGNOSIS — G8929 Other chronic pain: Secondary | ICD-10-CM | POA: Diagnosis present

## 2020-04-09 DIAGNOSIS — J90 Pleural effusion, not elsewhere classified: Secondary | ICD-10-CM | POA: Diagnosis not present

## 2020-04-09 DIAGNOSIS — F32A Depression, unspecified: Secondary | ICD-10-CM | POA: Diagnosis present

## 2020-04-09 DIAGNOSIS — I2510 Atherosclerotic heart disease of native coronary artery without angina pectoris: Secondary | ICD-10-CM | POA: Diagnosis not present

## 2020-04-09 LAB — TRIGLYCERIDES: Triglycerides: 113 mg/dL (ref <150)

## 2020-04-09 LAB — SARS CORONAVIRUS 2 BY RT PCR (HOSPITAL ORDER, PERFORMED IN ~~LOC~~ HOSPITAL LAB): SARS Coronavirus 2: POSITIVE — AB

## 2020-04-09 LAB — HEPATITIS B SURFACE ANTIGEN: Hepatitis B Surface Ag: NONREACTIVE

## 2020-04-09 LAB — COMPREHENSIVE METABOLIC PANEL
ALT: 63 U/L — ABNORMAL HIGH (ref 0–44)
AST: 91 U/L — ABNORMAL HIGH (ref 15–41)
Albumin: 3 g/dL — ABNORMAL LOW (ref 3.5–5.0)
Alkaline Phosphatase: 80 U/L (ref 38–126)
Anion gap: 15 (ref 5–15)
BUN: 24 mg/dL — ABNORMAL HIGH (ref 8–23)
CO2: 26 mmol/L (ref 22–32)
Calcium: 9.1 mg/dL (ref 8.9–10.3)
Chloride: 96 mmol/L — ABNORMAL LOW (ref 98–111)
Creatinine, Ser: 0.97 mg/dL (ref 0.44–1.00)
GFR calc Af Amer: >60 mL/min (ref >60)
GFR calc non Af Amer: 56 mL/min — ABNORMAL LOW (ref >60)
Glucose, Bld: 141 mg/dL — ABNORMAL HIGH (ref 70–99)
Potassium: 3.9 mmol/L (ref 3.5–5.1)
Sodium: 137 mmol/L (ref 135–145)
Total Bilirubin: 0.8 mg/dL (ref 0.3–1.2)
Total Protein: 7.3 g/dL (ref 6.5–8.1)

## 2020-04-09 LAB — FIBRINOGEN: Fibrinogen: >800 mg/dL — ABNORMAL HIGH (ref 210–475)

## 2020-04-09 LAB — CREATININE, SERUM
Creatinine, Ser: 1.07 mg/dL — ABNORMAL HIGH (ref 0.44–1.00)
GFR calc Af Amer: 58 mL/min — ABNORMAL LOW (ref >60)
GFR calc non Af Amer: 50 mL/min — ABNORMAL LOW (ref >60)

## 2020-04-09 LAB — CBC WITH DIFFERENTIAL/PLATELET
Abs Immature Granulocytes: 0.26 10*3/uL — ABNORMAL HIGH (ref 0.00–0.07)
Basophils Absolute: 0.1 10*3/uL (ref 0.0–0.1)
Basophils Relative: 0 %
Eosinophils Absolute: 0 10*3/uL (ref 0.0–0.5)
Eosinophils Relative: 0 %
HCT: 38.4 % (ref 36.0–46.0)
Hemoglobin: 12.3 g/dL (ref 12.0–15.0)
Immature Granulocytes: 2 %
Lymphocytes Relative: 5 %
Lymphs Abs: 0.8 10*3/uL (ref 0.7–4.0)
MCH: 29.4 pg (ref 26.0–34.0)
MCHC: 32 g/dL (ref 30.0–36.0)
MCV: 91.9 fL (ref 80.0–100.0)
Monocytes Absolute: 0.4 10*3/uL (ref 0.1–1.0)
Monocytes Relative: 3 %
Neutro Abs: 14.4 10*3/uL — ABNORMAL HIGH (ref 1.7–7.7)
Neutrophils Relative %: 90 %
Platelets: 367 10*3/uL (ref 150–400)
RBC: 4.18 MIL/uL (ref 3.87–5.11)
RDW: 13.1 % (ref 11.5–15.5)
WBC: 15.9 10*3/uL — ABNORMAL HIGH (ref 4.0–10.5)
nRBC: 0 % (ref 0.0–0.2)

## 2020-04-09 LAB — FERRITIN: Ferritin: 486 ng/mL — ABNORMAL HIGH (ref 11–307)

## 2020-04-09 LAB — ABO/RH: ABO/RH(D): O POS

## 2020-04-09 LAB — PROCALCITONIN: Procalcitonin: 0.72 ng/mL

## 2020-04-09 LAB — TROPONIN I (HIGH SENSITIVITY)
Troponin I (High Sensitivity): 10 ng/L (ref <18)
Troponin I (High Sensitivity): 9 ng/L (ref <18)

## 2020-04-09 LAB — BRAIN NATRIURETIC PEPTIDE: B Natriuretic Peptide: 86.1 pg/mL (ref 0.0–100.0)

## 2020-04-09 LAB — LACTIC ACID, PLASMA: Lactic Acid, Venous: 2.1 mmol/L (ref 0.5–1.9)

## 2020-04-09 LAB — C-REACTIVE PROTEIN: CRP: 36.3 mg/dL — ABNORMAL HIGH (ref <1.0)

## 2020-04-09 LAB — D-DIMER, QUANTITATIVE: D-Dimer, Quant: 2.36 ug/mL-FEU — ABNORMAL HIGH (ref 0.00–0.50)

## 2020-04-09 LAB — LACTATE DEHYDROGENASE: LDH: 421 U/L — ABNORMAL HIGH (ref 98–192)

## 2020-04-09 MED ORDER — GABAPENTIN 300 MG PO CAPS
300.0000 mg | ORAL_CAPSULE | Freq: Two times a day (BID) | ORAL | Status: DC
  Administered 2020-04-10 – 2020-04-20 (×21): 300 mg via ORAL
  Filled 2020-04-09 (×21): qty 1

## 2020-04-09 MED ORDER — PANTOPRAZOLE SODIUM 40 MG PO TBEC
40.0000 mg | DELAYED_RELEASE_TABLET | Freq: Every day | ORAL | Status: DC
  Administered 2020-04-09 – 2020-04-20 (×12): 40 mg via ORAL
  Filled 2020-04-09 (×12): qty 1

## 2020-04-09 MED ORDER — ZINC SULFATE 220 (50 ZN) MG PO CAPS
220.0000 mg | ORAL_CAPSULE | Freq: Every day | ORAL | Status: DC
  Administered 2020-04-09 – 2020-04-20 (×12): 220 mg via ORAL
  Filled 2020-04-09 (×12): qty 1

## 2020-04-09 MED ORDER — SODIUM CHLORIDE 0.9 % IV SOLN
500.0000 mg | INTRAVENOUS | Status: DC
  Administered 2020-04-09 – 2020-04-11 (×3): 500 mg via INTRAVENOUS
  Filled 2020-04-09 (×4): qty 500

## 2020-04-09 MED ORDER — SODIUM CHLORIDE 0.9 % IV SOLN
1.0000 g | INTRAVENOUS | Status: AC
  Administered 2020-04-09 – 2020-04-13 (×5): 1 g via INTRAVENOUS
  Filled 2020-04-09 (×2): qty 1
  Filled 2020-04-09 (×4): qty 10

## 2020-04-09 MED ORDER — HYDROCOD POLST-CPM POLST ER 10-8 MG/5ML PO SUER
5.0000 mL | Freq: Two times a day (BID) | ORAL | Status: DC | PRN
  Administered 2020-04-14 – 2020-04-25 (×6): 5 mL via ORAL
  Filled 2020-04-09 (×6): qty 5

## 2020-04-09 MED ORDER — ATORVASTATIN CALCIUM 10 MG PO TABS
20.0000 mg | ORAL_TABLET | Freq: Every day | ORAL | Status: DC
  Administered 2020-04-09 – 2020-04-20 (×12): 20 mg via ORAL
  Filled 2020-04-09 (×13): qty 2

## 2020-04-09 MED ORDER — ONDANSETRON HCL 4 MG/2ML IJ SOLN
4.0000 mg | Freq: Four times a day (QID) | INTRAMUSCULAR | Status: DC | PRN
  Administered 2020-04-10: 4 mg via INTRAVENOUS
  Filled 2020-04-09: qty 2

## 2020-04-09 MED ORDER — TRAMADOL HCL 50 MG PO TABS
100.0000 mg | ORAL_TABLET | Freq: Two times a day (BID) | ORAL | Status: DC | PRN
  Administered 2020-04-10 – 2020-04-19 (×8): 100 mg via ORAL
  Filled 2020-04-09 (×8): qty 2

## 2020-04-09 MED ORDER — SODIUM CHLORIDE 0.9 % IV SOLN
200.0000 mg | Freq: Once | INTRAVENOUS | Status: AC
  Administered 2020-04-09: 200 mg via INTRAVENOUS
  Filled 2020-04-09: qty 40

## 2020-04-09 MED ORDER — SODIUM CHLORIDE 0.9 % IV SOLN
100.0000 mg | Freq: Every day | INTRAVENOUS | Status: DC
  Filled 2020-04-09: qty 20

## 2020-04-09 MED ORDER — METHYLPREDNISOLONE SODIUM SUCC 40 MG IJ SOLR
36.0000 mg | Freq: Two times a day (BID) | INTRAMUSCULAR | Status: DC
  Administered 2020-04-09 – 2020-04-10 (×2): 36 mg via INTRAVENOUS
  Filled 2020-04-09 (×2): qty 1

## 2020-04-09 MED ORDER — ASCORBIC ACID 500 MG PO TABS
500.0000 mg | ORAL_TABLET | Freq: Every day | ORAL | Status: DC
  Administered 2020-04-09 – 2020-04-20 (×12): 500 mg via ORAL
  Filled 2020-04-09 (×13): qty 1

## 2020-04-09 MED ORDER — HYDROXYCHLOROQUINE SULFATE 200 MG PO TABS
200.0000 mg | ORAL_TABLET | Freq: Two times a day (BID) | ORAL | Status: DC
  Administered 2020-04-10 – 2020-04-22 (×24): 200 mg via ORAL
  Filled 2020-04-09 (×30): qty 1

## 2020-04-09 MED ORDER — AEROCHAMBER PLUS FLO-VU LARGE MISC: 1.0000 | Freq: Once | Status: DC

## 2020-04-09 MED ORDER — ALBUTEROL SULFATE HFA 108 (90 BASE) MCG/ACT IN AERS
2.0000 | INHALATION_SPRAY | Freq: Four times a day (QID) | RESPIRATORY_TRACT | Status: DC
  Administered 2020-04-09 – 2020-04-21 (×47): 2 via RESPIRATORY_TRACT
  Filled 2020-04-09: qty 6.7

## 2020-04-09 MED ORDER — ACETAMINOPHEN 325 MG PO TABS: 650.0000 mg | ORAL_TABLET | Freq: Four times a day (QID) | ORAL | Status: DC | PRN

## 2020-04-09 MED ORDER — ENOXAPARIN SODIUM 40 MG/0.4ML ~~LOC~~ SOLN
40.0000 mg | SUBCUTANEOUS | Status: DC
  Administered 2020-04-09 – 2020-04-10 (×2): 40 mg via SUBCUTANEOUS
  Filled 2020-04-09: qty 0.4

## 2020-04-09 MED ORDER — DEXAMETHASONE SODIUM PHOSPHATE 10 MG/ML IJ SOLN
10.0000 mg | Freq: Once | INTRAMUSCULAR | Status: AC
  Administered 2020-04-09: 10 mg via INTRAVENOUS
  Filled 2020-04-09: qty 1

## 2020-04-09 MED ORDER — ONDANSETRON HCL 4 MG PO TABS: 4.0000 mg | ORAL_TABLET | Freq: Four times a day (QID) | ORAL | Status: DC | PRN

## 2020-04-09 MED ORDER — GUAIFENESIN-DM 100-10 MG/5ML PO SYRP
10.0000 mL | ORAL_SOLUTION | ORAL | Status: DC | PRN
  Administered 2020-04-10 – 2020-04-18 (×4): 10 mL via ORAL
  Filled 2020-04-09 (×4): qty 10

## 2020-04-09 MED ORDER — ACETAMINOPHEN 325 MG PO TABS
650.0000 mg | ORAL_TABLET | Freq: Once | ORAL | Status: AC
  Administered 2020-04-09: 650 mg via ORAL
  Filled 2020-04-09: qty 2

## 2020-04-09 MED ORDER — SUMATRIPTAN SUCCINATE 100 MG PO TABS: 100.0000 mg | ORAL_TABLET | ORAL | Status: DC | PRN

## 2020-04-09 MED ORDER — ALBUTEROL SULFATE HFA 108 (90 BASE) MCG/ACT IN AERS
2.0000 | INHALATION_SPRAY | Freq: Once | RESPIRATORY_TRACT | Status: AC
  Administered 2020-04-09: 2 via RESPIRATORY_TRACT
  Filled 2020-04-09: qty 6.7

## 2020-04-09 NOTE — ED Notes (Signed)
Pt oxygen saturation noted to be 87% with good pleth. Oxygen increased to 6L [place] with improvement to 92%.

## 2020-04-09 NOTE — H&P (Addendum)
History and Physical    [name] PRF:[id number] DOB: June 25, 1941 DOA: 04/08/2020  PCP: [name], MD  Patient coming from: Home  I have personally briefly reviewed patient's old medical records in [place]  Chief Complaint: Worsening shortness of breath.  HPI: [name] is a 79 y.o. female with medical history significant of hypertension, migraine headache, GERD, hyperlipidemia presents to emergency department with worsening shortness of breath.  Patient reports shortness of breath, cough, fever, chills, fatigue, generalized weakness, no energy, loss of sense of smell and taste, decreased p.o. intake since 1 week.  Patient tells me that she is fully vaccinated against COVID-19 however she recently exposed to her son who had contracted Covid infection at work.  Patient reports that her cough and fever has improved however due to worsening shortness of breath she came to ER for further evaluation and management.  Denies headache, blurry vision, lightheadedness, dizziness, chest pain, vomiting, abdominal pain, swelling of legs, history of DVT/PE, immobilization, urinary or bowel changes.  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  No history of smoking, alcohol, licit drug use.  ED Course: Upon arrival to ED: Patient tachypneic, hypoxemic requiring nonrebreather and then placed on 4 L of oxygen via nasal cannula, she tested positive for COVID-19.  She is afebrile with leukocytosis of 15.9, lactic acid: 2.1, CMP shows AST of 91, ALT: 63, D-dimer: 2.36, procalcitonin: 0.72, CRP: 36, chest x-ray shows diffuse increased interstitial markings throughout both lungs which may reflect edema versus atypical/viral infection.  Patient received Decadron, albuterol, Tylenol in ED.  [facility] hospitalist consulted for admission for acute hypoxemic respiratory failure secondary to COVID-19 pneumonia.  Review of Systems: As per HPI otherwise  negative.    Past Medical History:  Diagnosis Date  . DEPRESSION   . DIVERTICULOSIS, COLON   . DYSLIPIDEMIA   . GERD   . HYPERTENSION   . MIGRAINE HEADACHE   . Multinodular goiter   . NONSPECIFIC ABNORMAL ELECTROCARDIOGRAM 09/15/2010   normal stress test 09/2010  . PLANTAR FASCIITIS   . SPINAL STENOSIS     Past Surgical History:  Procedure Laterality Date  . CATARACT EXTRACTION Left 07/05/13   [facility]  . CATARACT EXTRACTION  winter 2015  . child birth     x's 2 72 & 57  . TONSILLECTOMY       reports that she has never smoked. She has never used smokeless tobacco. She reports that she does not drink alcohol and does not use drugs.  No Known Allergies  Family History  Problem Relation Age of Onset  . Arthritis Mother   . Arthritis Father   . Hyperlipidemia Other        parents  . Heart disease Other        parent, grandparents  . Breast cancer Maternal Aunt     Prior to Admission medications   Medication Sig Start Date End Date Taking? Authorizing Provider  gabapentin (NEURONTIN) 300 MG capsule TAKE 1 CAPSULE BY MOUTH  TWICE DAILY 03/31/20   [name], MD  atorvastatin (LIPITOR) 20 MG tablet TAKE 1 TABLET BY MOUTH  DAILY 11/01/19   [name], MD  Calcium Citrate-Vitamin D (CITRACAL PETITES/VITAMIN D) 200-250 MG-UNIT TABS Take by mouth 2 (two) times daily.      [provider]  Cholecalciferol (VITAMIN D3) 1000 UNITS CAPS Take by mouth daily.      [provider]  Coenzyme Q10 (CO Q 10 PO)  Take by mouth daily.    [provider]  hydroxychloroquine (PLAQUENIL) 200 MG tablet Take 200 mg by mouth 2 (two) times daily. 09/29/19   [provider]  losartan (COZAAR) 100 MG tablet TAKE 1 TABLET BY MOUTH  DAILY 11/01/19   [name], MD  Multiple Vitamin (MULTIVITAMIN) tablet Take 1 tablet by mouth daily.      [provider]  omeprazole (PRILOSEC) 20 MG capsule TAKE 1 CAPSULE BY MOUTH DAILY. TAKE 30 MINUTES TO BREAKFAST  03/04/20   [name], MD  SUMAtriptan (IMITREX) 100 MG tablet Take 1 tablet (100 mg total) by mouth as needed for migraine. May repeat in 2 hours if headache persists or recurs. 08/03/18   [name], MD  traMADol (ULTRAM) 50 MG tablet TAKE 2 TABLETS (100 MG TOTAL) BY MOUTH 2 (TWO) TIMES DAILY. 01/17/20   [name], MD    Physical Exam: Vitals:   03/27/2020 1430 03/28/2020 1445 04/01/2020 1500 04/02/2020 1515  BP: 129/65 108/60 (!) 117/56   Pulse: 83 81 79 80  Resp: (!) 36 (!) 34 (!) 37 (!) 33  Temp:      TempSrc:      SpO2: 92% 92% 94% 91%    Constitutional: NAD, calm, comfortable, on 4 L of oxygen via nasal cannula, appears weak and lethargic Eyes: PERRL, lids and conjunctivae normal ENMT: Mucous membranes are moist. Posterior pharynx clear of any exudate or lesions.Normal dentition.  Neck: normal, supple, no masses, no thyromegaly Respiratory: Tachypneic, no wheezing or rhonchi noted Cardiovascular: Regular rate and rhythm, no murmurs / rubs / gallops. No extremity edema. 2+ pedal pulses. No carotid bruits.  Abdomen: no tenderness, no masses palpated. No hepatosplenomegaly. Bowel sounds positive.  Musculoskeletal: no clubbing / cyanosis. No joint deformity upper and lower extremities. Good ROM, no contractures. Normal muscle tone.  Skin: no rashes, lesions, ulcers. No induration Neurologic: CN 2-12 grossly intact. Sensation intact, DTR normal. Strength 5/5 in all 4.  Psychiatric: Normal judgment and insight. Alert and oriented x 3. Normal mood.    Labs on Admission: I have personally reviewed following labs and imaging studies  CBC: Recent Labs  Lab 03/30/2020 1248  WBC 15.9*  NEUTROABS 14.4*  HGB 12.3  HCT 38.4  MCV 91.9  PLT 063   Basic Metabolic Panel: Recent Labs  Lab 04/08/2020 1248  NA 137  K 3.9  CL 96*  CO2 26  GLUCOSE 141*  BUN 24*  CREATININE 0.97  CALCIUM 9.1   GFR: CrCl cannot be calculated (Unknown ideal weight.). Liver Function Tests: Recent  Labs  Lab 03/16/2020 1248  AST 91*  ALT 63*  ALKPHOS 80  BILITOT 0.8  PROT 7.3  ALBUMIN 3.0*   No results for input(s): LIPASE, AMYLASE in the last 168 hours. No results for input(s): AMMONIA in the last 168 hours. Coagulation Profile: No results for input(s): INR, PROTIME in the last 168 hours. Cardiac Enzymes: No results for input(s): CKTOTAL, CKMB, CKMBINDEX, TROPONINI in the last 168 hours. BNP (last 3 results) No results for input(s): PROBNP in the last 8760 hours. HbA1C: No results for input(s): HGBA1C in the last 72 hours. CBG: No results for input(s): GLUCAP in the last 168 hours. Lipid Profile: Recent Labs    04/10/2020 1302  TRIG 113   Thyroid Function Tests: No results for input(s): TSH, T4TOTAL, FREET4, T3FREE, THYROIDAB in the last 72 hours. Anemia Panel: Recent Labs    04/04/2020 1302  FERRITIN 486*   Urine  analysis:    Component Value Date/Time   BILIRUBINUR 1+ 07/30/2014 1541   PROTEINUR 2+ 07/30/2014 1541   UROBILINOGEN 0.2 07/30/2014 1541   NITRITE n 07/30/2014 1541   LEUKOCYTESUR moderate (2+) 07/30/2014 1541    Radiological Exams on Admission: DG Chest Portable 1 View  Result Date: 04/06/2020 CLINICAL DATA:  Shortness of breath.  COVID-19 exposure EXAM: PORTABLE CHEST 1 VIEW COMPARISON:  01/13/2012 FINDINGS: The heart size and mediastinal contours are within normal limits. Atherosclerotic calcification of the aortic knob. Diffuse increased interstitial markings throughout both lungs. No pleural effusion or pneumothorax. The visualized skeletal structures are unremarkable. IMPRESSION: Diffuse increased interstitial markings throughout both lungs, which may reflect edema versus atypical/viral infection. Electronically Signed   By: Davina Poke D.O.   On: 04/14/2020 13:36    EKG: Independently reviewed.  Normal sinus rhythm.  No ST elevation or depression noted.  Assessment/Plan Principal Problem:   Acute hypoxemic respiratory failure due to  COVID-19 [facility]) Active Problems:   Hyperlipidemia   Anxiety and depression   Migraine headache   Essential hypertension   GERD   Elevated liver enzymes    Severe sepsis/acute hypoxemic respiratory failure secondary to COVID-19 pneumonia: -Patient presented with tachypnea, hypoxia-initially requiring nonrebreather then placed on 4 L of oxygen via nasal cannula, COVID-19 positive, reviewed chest x-ray, lactic acid: 2.1, afebrile with leukocytosis of 15.9 -Received Decadron and albuterol breathing treatment and Tylenol in ED. -Admit patient stepdown unit for close monitoring.  On continuous pulse ox.  We will try to wean off of oxygen as tolerated. -Start patient on remdesivir and Solu-Medrol.  Inflammatory markers are elevated.  Repeat inflammatory markers tomorrow a.m.  Start patient on p.o. vitamins and antitussive -CRP: 36.  Will start patient on Rocephin and azithromycin.  Blood culture: Pending. -Patient was told that if COVID-19 pneumonitis gets worse we might potentially use Actemra off label, she denies known history of tuberculosis or hepatitis and wants to proceed with Actemra treatment if required. -Monitor vitals closely.  Elevated liver enzymes: AST: 91, ALT: 63 -Likely in the setting of COVID-19 -Repeat CMP tomorrow a.m.  Hypertension: Is on lower side.  108/52. -Hold losartan for now.  Monitor blood pressure closely and resume losartan once blood pressure is back to baseline.  GERD: Continue PPI  Hyperlipidemia: Continue statin  Migraine headache: Continue Imitrex  Chronic back pain: Tramadol daily  DVT prophylaxis: Lovenox/SCD Code Status: Full code Family Communication: None present at bedside.  Plan of care discussed with patient in length and he verbalized understanding and agreed with it. Disposition Plan: Home in 3 to 4 days Consults called: None Admission status: Inpatient   Mckinley Jewel MD [facility]  If 7PM-7AM, please contact  night-coverage [email or web address] Password [facility]  04/15/2020, 3:52 PM

## 2020-04-09 NOTE — ED Triage Notes (Signed)
Pt from home with ems for c.o sob, fatigue, loss of taste and smell. pts son tested positive for COVID, pt has not tested for COVID since her symptoms started. Pt has had both vaccines for COVID. Pt 74% on room air, 89% on 6L [place], 96% 10L [facility]. No acute resp distress noted at this time.

## 2020-04-09 NOTE — ED Notes (Signed)
Dinner tray delivered.

## 2020-04-09 NOTE — ED Provider Notes (Signed)
[facility] EMERGENCY DEPARTMENT Provider Note   CSN: [id number] Arrival date & time: 04/06/2020  1227     History Chief Complaint  Patient presents with  . Shortness of Breath    [name] is a 79 y.o. female.  HPI   This patient is a ill-appearing 78 year old female, she has a history of hypertension for which she takes a daily medication, she is followed by Dr. Billey Gosling at [place], she recently was exposed to her son who had contracted SARS-CoV-2 from a coworker and over the last 8 days the patient has developed similar symptoms with coughing which was particularly intense for the first 4 days followed by some fevers which left her very diaphoretic.  The cough is improved the fevers have improved however her shortness of breath has been progressive and much more concerning.  On arrival today the patient was noted to be tachypneic at 26 breaths/min with low oxygen levels and a normal blood pressure.  She denies swelling of the legs, denies chest pain, denies blurred vision though she has had some headache.  Symptoms are persistent and gradually worsening.  Past Medical History:  Diagnosis Date  . DEPRESSION   . DIVERTICULOSIS, COLON   . DYSLIPIDEMIA   . GERD   . HYPERTENSION   . MIGRAINE HEADACHE   . Multinodular goiter   . NONSPECIFIC ABNORMAL ELECTROCARDIOGRAM 09/15/2010   normal stress test 09/2010  . PLANTAR FASCIITIS   . SPINAL STENOSIS     Patient Active Problem List   Diagnosis Date Noted  . 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 Left foot pain 04/16/2019  . Chest pain 10/19/2018  . Prediabetes 10/18/2018  . Skin abnormality 03/17/2018  . 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 Squamous cell cancer of skin of hand 08/05/2015  . Difficulty sleeping 08/05/2015  . NONSPECIFIC ABNORMAL ELECTROCARDIOGRAM 09/15/2010  . GERD 01/09/2010  . DIVERTICULOSIS, COLON 01/09/2010  . Spinal stenosis of lumbar region 01/09/2010  . Hyperlipidemia 12/29/2009  .  Anxiety and depression 12/29/2009  . Migraine headache 12/29/2009  . Essential hypertension 12/29/2009    Past Surgical History:  Procedure Laterality Date  . CATARACT EXTRACTION Left 07/05/13   [facility]  . CATARACT EXTRACTION  winter 2015  . child birth     x's 2 69 & 61  . TONSILLECTOMY       OB History   No obstetric history on file.     Family History  Problem Relation Age of Onset  . Arthritis Mother   . Arthritis Father   . Hyperlipidemia Other        parents  . Heart disease Other        parent, grandparents  . Breast cancer Maternal Aunt     Social History   Tobacco Use  . Smoking status: Never Smoker  . Smokeless tobacco: Never Used  . Tobacco comment: Married, lives with spouse and 2 sons involved with social activites with spouse at [employer] and church  Substance Use Topics  . Alcohol use: No  . Drug use: No    Home Medications Prior to Admission medications   Medication Sig Start Date End Date Taking? Authorizing Provider  gabapentin (NEURONTIN) 300 MG capsule TAKE 1 CAPSULE BY MOUTH  TWICE DAILY 03/31/20   [name], MD  atorvastatin (LIPITOR) 20 MG tablet TAKE 1 TABLET BY MOUTH  DAILY 11/01/19   [name], MD  Calcium Citrate-Vitamin D (CITRACAL PETITES/VITAMIN D) 200-250 MG-UNIT TABS Take  by mouth 2 (two) times daily.      [provider]  Cholecalciferol (VITAMIN D3) 1000 UNITS CAPS Take by mouth daily.      [provider]  Coenzyme Q10 (CO Q 10 PO) Take by mouth daily.    [provider]  hydroxychloroquine (PLAQUENIL) 200 MG tablet Take 200 mg by mouth 2 (two) times daily. 09/29/19   [provider]  losartan (COZAAR) 100 MG tablet TAKE 1 TABLET BY MOUTH  DAILY 11/01/19   [name], MD  Multiple Vitamin (MULTIVITAMIN) tablet Take 1 tablet by mouth daily.      [provider]  omeprazole (PRILOSEC) 20 MG capsule TAKE 1 CAPSULE BY MOUTH DAILY. TAKE 30 MINUTES TO BREAKFAST 03/04/20   [name], MD  SUMAtriptan (IMITREX) 100 MG tablet Take 1 tablet (100 mg total) by mouth as needed for migraine. May repeat in 2 hours if headache persists or recurs. 08/03/18   [name], MD  traMADol (ULTRAM) 50 MG tablet TAKE 2 TABLETS (100 MG TOTAL) BY MOUTH 2 (TWO) TIMES DAILY. 01/17/20   [name], MD    Allergies    Patient has no known allergies.  Review of Systems   Review of Systems  All other systems reviewed and are negative.   Physical Exam Updated Vital Signs BP (!) 117/56   Pulse 80   Temp 99.4 F (37.4 C) (Oral)   Resp (!) 33   SpO2 91%   Physical Exam Vitals and nursing note reviewed.  Constitutional:      General: She is in acute distress.     Appearance: She is well-developed. She is ill-appearing.  HENT:     Head: Normocephalic and atraumatic.     Mouth/Throat:     Pharynx: No oropharyngeal exudate.  Eyes:     General: No scleral icterus.       Right eye: No discharge.        Left eye: No discharge.     Conjunctiva/sclera: Conjunctivae normal.     Pupils: Pupils are equal, round, and reactive to light.  Neck:     Thyroid: No thyromegaly.     Vascular: No JVD.  Cardiovascular:     Rate and Rhythm: Regular rhythm. Tachycardia present.     Heart sounds: Normal heart sounds. No murmur heard.  No friction rub. No gallop.   Pulmonary:     Effort: Respiratory distress present.     Breath sounds: Rales present. No wheezing.     Comments: Subtle rales bilaterally, she is tachypneic to 26 to 30 breaths/min, she speaks in shortened sentences, she has increased work of breathing.  Oxygen level is 82% on room air and dropping prior to being placed on oxygen Abdominal:     General: Bowel sounds are normal. There is no distension.     Palpations: Abdomen is soft. There is no mass.     Tenderness: There is no abdominal tenderness.  Musculoskeletal:        General: No tenderness. Normal range of motion.     Cervical back: Normal range of motion and neck  supple.  Lymphadenopathy:     Cervical: No cervical adenopathy.  Skin:    General: Skin is warm and dry.     Findings: No erythema or rash.  Neurological:     Mental Status: She is alert.     Coordination: Coordination normal.  Psychiatric:        Behavior: Behavior normal.  ED Results / Procedures / Treatments   Labs (all labs ordered are listed, but only abnormal results are displayed) Labs Reviewed  SARS CORONAVIRUS 2 BY RT PCR (HOSPITAL ORDER, [facility]) - Abnormal; Notable for the following components:      Result Value   SARS Coronavirus 2 POSITIVE (*)    All other components within normal limits  LACTIC ACID, PLASMA - Abnormal; Notable for the following components:   Lactic Acid, Venous 2.1 (*)    All other components within normal limits  COMPREHENSIVE METABOLIC PANEL - Abnormal; Notable for the following components:   Chloride 96 (*)    Glucose, Bld 141 (*)    BUN 24 (*)    Albumin 3.0 (*)    AST 91 (*)    ALT 63 (*)    GFR calc non Af Amer 56 (*)    All other components within normal limits  CBC WITH DIFFERENTIAL/PLATELET - Abnormal; Notable for the following components:   WBC 15.9 (*)    Neutro Abs 14.4 (*)    Abs Immature Granulocytes 0.26 (*)    All other components within normal limits  D-DIMER, QUANTITATIVE (NOT AT [facility]) - Abnormal; Notable for the following components:   D-Dimer, Quant 2.36 (*)    All other components within normal limits  LACTATE DEHYDROGENASE - Abnormal; Notable for the following components:   LDH 421 (*)    All other components within normal limits  FERRITIN - Abnormal; Notable for the following components:   Ferritin 486 (*)    All other components within normal limits  FIBRINOGEN - Abnormal; Notable for the following components:   Fibrinogen >800 (*)    All other components within normal limits  C-REACTIVE PROTEIN - Abnormal; Notable for the following components:   CRP 36.3 (*)    All other  components within normal limits  CULTURE, BLOOD (ROUTINE X 2)  CULTURE, BLOOD (ROUTINE X 2)  TRIGLYCERIDES  PROCALCITONIN    EKG EKG Interpretation  Date/Time:  Wednesday April 09 2020 12:36:22 EDT Ventricular Rate:  92 PR Interval:  142 QRS Duration: 70 QT Interval:  334 QTC Calculation: 413 R Axis:   58 Text Interpretation: Normal sinus rhythm Cannot rule out Anterior infarct , age undetermined Abnormal ECG Confirmed by Noemi Chapel [phone]) on 03/31/2020 3:12:46 PM   Radiology DG Chest Portable 1 View  Result Date: 03/28/2020 CLINICAL DATA:  Shortness of breath.  COVID-19 exposure EXAM: PORTABLE CHEST 1 VIEW COMPARISON:  01/13/2012 FINDINGS: The heart size and mediastinal contours are within normal limits. Atherosclerotic calcification of the aortic knob. Diffuse increased interstitial markings throughout both lungs. No pleural effusion or pneumothorax. The visualized skeletal structures are unremarkable. IMPRESSION: Diffuse increased interstitial markings throughout both lungs, which may reflect edema versus atypical/viral infection. Electronically Signed   By: Davina Poke D.O.   On: 03/27/2020 13:36    Procedures .Critical Care Performed by: Noemi Chapel, MD Authorized by: Noemi Chapel, MD   Critical care provider statement:    Critical care time (minutes):  35   Critical care time was exclusive of:  Separately billable procedures and treating other patients and teaching time   Critical care was necessary to treat or prevent imminent or life-threatening deterioration of the following conditions:  Respiratory failure   Critical care was time spent personally by me on the following activities:  Blood draw for specimens, development of treatment plan with patient or surrogate, discussions with consultants, evaluation of patient's response to  treatment, examination of patient, obtaining history from patient or surrogate, ordering and performing treatments and interventions,  ordering and review of laboratory studies, ordering and review of radiographic studies, pulse oximetry, re-evaluation of patient's condition and review of old charts   (including critical care time)  Medications Ordered in ED Medications  albuterol (VENTOLIN HFA) 108 (90 Base) MCG/ACT inhaler 2 puff (has no administration in time range)  AeroChamber Plus Flo-Vu Large MISC 1 each (has no administration in time range)  acetaminophen (TYLENOL) tablet 650 mg (650 mg Oral Given 03/18/2020 1246)  dexamethasone (DECADRON) injection 10 mg (10 mg Intravenous Given 04/08/2020 1331)    ED Course  I have reviewed the triage vital signs and the nursing notes.  Pertinent labs & imaging results that were available during my care of the patient were reviewed by me and considered in my medical decision making (see chart for details).    MDM Rules/Calculators/A&P                          With 4 L of oxygen the patient's level has come up to above 90%, she is speaking in full sentences with the oxygen.  Given her new oxygen requirement and respiratory distress I suspect that she does have pneumonia likely from SARS-CoV-2.  This patient will require further testing work-up and a chest x-ray and likely admission to the hospital for further treatment.  We will get her initiated with Decadron, the patient is in hypoxic respiratory failure, she is critically ill and she will need to be admitted.  She is agreeable to the plan  The patient's inflammatory markers are all elevated, the x-ray is consistent with an inflammatory process or early viral pneumonia, Covid test pending.  At change of shift care signed out to Dr. Karle Starch to follow-up results and disposition accordingly.  [name] was evaluated in Emergency Department on 04/06/2020 for the symptoms described in the history of present illness. She was evaluated in the context of the global COVID-19 pandemic, which necessitated consideration that the patient  might be at risk for infection with the SARS-CoV-2 virus that causes COVID-19. Institutional protocols and algorithms that pertain to the evaluation of patients at risk for COVID-19 are in a state of rapid change based on information released by regulatory bodies including the CDC and federal and state organizations. These policies and algorithms were followed during the patient's care in the ED.   Final Clinical Impression(s) / ED Diagnoses Final diagnoses:  Acute respiratory failure with hypoxia [facility])    Rx / DC Orders ED Discharge Orders    None       Noemi Chapel, MD 03/27/2020 1528

## 2020-04-10 ENCOUNTER — Inpatient Hospital Stay (HOSPITAL_COMMUNITY): Payer: Medicare Other

## 2020-04-10 DIAGNOSIS — R748 Abnormal levels of other serum enzymes: Secondary | ICD-10-CM

## 2020-04-10 DIAGNOSIS — R1013 Epigastric pain: Secondary | ICD-10-CM

## 2020-04-10 LAB — CBC WITH DIFFERENTIAL/PLATELET
Abs Immature Granulocytes: 0.28 10*3/uL — ABNORMAL HIGH (ref 0.00–0.07)
Abs Immature Granulocytes: 0.4 10*3/uL — ABNORMAL HIGH (ref 0.00–0.07)
Basophils Absolute: 0.1 10*3/uL (ref 0.0–0.1)
Basophils Absolute: 0.1 10*3/uL (ref 0.0–0.1)
Basophils Relative: 0 %
Basophils Relative: 1 %
Eosinophils Absolute: 0 10*3/uL (ref 0.0–0.5)
Eosinophils Absolute: 0.3 10*3/uL (ref 0.0–0.5)
Eosinophils Relative: 0 %
Eosinophils Relative: 2 %
HCT: 39.8 % (ref 36.0–46.0)
HCT: 41.1 % (ref 36.0–46.0)
Hemoglobin: 13.1 g/dL (ref 12.0–15.0)
Hemoglobin: 13.3 g/dL (ref 12.0–15.0)
Immature Granulocytes: 2 %
Immature Granulocytes: 2 %
Lymphocytes Relative: 5 %
Lymphocytes Relative: 4 %
Lymphs Abs: 0.7 10*3/uL (ref 0.7–4.0)
Lymphs Abs: 0.8 10*3/uL (ref 0.7–4.0)
MCH: 30.2 pg (ref 26.0–34.0)
MCH: 29.8 pg (ref 26.0–34.0)
MCHC: 32.9 g/dL (ref 30.0–36.0)
MCHC: 32.4 g/dL (ref 30.0–36.0)
MCV: 91.7 fL (ref 80.0–100.0)
MCV: 92.2 fL (ref 80.0–100.0)
Monocytes Absolute: 0.2 10*3/uL (ref 0.1–1.0)
Monocytes Absolute: 0.4 10*3/uL (ref 0.1–1.0)
Monocytes Relative: 1 %
Monocytes Relative: 2 %
Neutro Abs: 14.1 10*3/uL — ABNORMAL HIGH (ref 1.7–7.7)
Neutro Abs: 17.8 10*3/uL — ABNORMAL HIGH (ref 1.7–7.7)
Neutrophils Relative %: 92 %
Neutrophils Relative %: 89 %
Platelets: 364 10*3/uL (ref 150–400)
Platelets: 423 10*3/uL — ABNORMAL HIGH (ref 150–400)
RBC: 4.34 MIL/uL (ref 3.87–5.11)
RBC: 4.46 MIL/uL (ref 3.87–5.11)
RDW: 13.1 % (ref 11.5–15.5)
RDW: 13.1 % (ref 11.5–15.5)
WBC: 15.4 10*3/uL — ABNORMAL HIGH (ref 4.0–10.5)
WBC: 19.8 10*3/uL — ABNORMAL HIGH (ref 4.0–10.5)
nRBC: 0 % (ref 0.0–0.2)
nRBC: 0 % (ref 0.0–0.2)

## 2020-04-10 LAB — HEPATIC FUNCTION PANEL
ALT: 474 U/L — ABNORMAL HIGH (ref 0–44)
AST: 697 U/L — ABNORMAL HIGH (ref 15–41)
Albumin: 2.9 g/dL — ABNORMAL LOW (ref 3.5–5.0)
Alkaline Phosphatase: 134 U/L — ABNORMAL HIGH (ref 38–126)
Bilirubin, Direct: 0.4 mg/dL — ABNORMAL HIGH (ref 0.0–0.2)
Indirect Bilirubin: 1 mg/dL — ABNORMAL HIGH (ref 0.3–0.9)
Total Bilirubin: 1.4 mg/dL — ABNORMAL HIGH (ref 0.3–1.2)
Total Protein: 7.2 g/dL (ref 6.5–8.1)

## 2020-04-10 LAB — BASIC METABOLIC PANEL
Anion gap: 16 — ABNORMAL HIGH (ref 5–15)
BUN: 37 mg/dL — ABNORMAL HIGH (ref 8–23)
CO2: 23 mmol/L (ref 22–32)
Calcium: 9.1 mg/dL (ref 8.9–10.3)
Chloride: 103 mmol/L (ref 98–111)
Creatinine, Ser: 0.98 mg/dL (ref 0.44–1.00)
GFR calc Af Amer: >60 mL/min (ref >60)
GFR calc non Af Amer: 55 mL/min — ABNORMAL LOW (ref >60)
Glucose, Bld: 146 mg/dL — ABNORMAL HIGH (ref 70–99)
Potassium: 4.2 mmol/L (ref 3.5–5.1)
Sodium: 142 mmol/L (ref 135–145)

## 2020-04-10 LAB — CBG MONITORING, ED: Glucose-Capillary: 149 mg/dL — ABNORMAL HIGH (ref 70–99)

## 2020-04-10 LAB — HEPATITIS PANEL, ACUTE
HCV Ab: NONREACTIVE
Hep A IgM: NONREACTIVE
Hep B C IgM: NONREACTIVE
Hepatitis B Surface Ag: NONREACTIVE

## 2020-04-10 LAB — D-DIMER, QUANTITATIVE: D-Dimer, Quant: 2.43 ug/mL-FEU — ABNORMAL HIGH (ref 0.00–0.50)

## 2020-04-10 LAB — COMPREHENSIVE METABOLIC PANEL
ALT: 275 U/L — ABNORMAL HIGH (ref 0–44)
AST: 504 U/L — ABNORMAL HIGH (ref 15–41)
Albumin: 2.8 g/dL — ABNORMAL LOW (ref 3.5–5.0)
Alkaline Phosphatase: 118 U/L (ref 38–126)
Anion gap: 14 (ref 5–15)
BUN: 30 mg/dL — ABNORMAL HIGH (ref 8–23)
CO2: 25 mmol/L (ref 22–32)
Calcium: 9.1 mg/dL (ref 8.9–10.3)
Chloride: 99 mmol/L (ref 98–111)
Creatinine, Ser: 1 mg/dL (ref 0.44–1.00)
GFR calc Af Amer: >60 mL/min (ref >60)
GFR calc non Af Amer: 54 mL/min — ABNORMAL LOW (ref >60)
Glucose, Bld: 155 mg/dL — ABNORMAL HIGH (ref 70–99)
Potassium: 3.7 mmol/L (ref 3.5–5.1)
Sodium: 138 mmol/L (ref 135–145)
Total Bilirubin: 0.8 mg/dL (ref 0.3–1.2)
Total Protein: 7.1 g/dL (ref 6.5–8.1)

## 2020-04-10 LAB — MAGNESIUM: Magnesium: 2.9 mg/dL — ABNORMAL HIGH (ref 1.7–2.4)

## 2020-04-10 LAB — LIPASE, BLOOD: Lipase: 59 U/L — ABNORMAL HIGH (ref 11–51)

## 2020-04-10 LAB — PROCALCITONIN: Procalcitonin: 0.64 ng/mL

## 2020-04-10 LAB — C-REACTIVE PROTEIN: CRP: 28.4 mg/dL — ABNORMAL HIGH (ref <1.0)

## 2020-04-10 LAB — FERRITIN: Ferritin: 1490 ng/mL — ABNORMAL HIGH (ref 11–307)

## 2020-04-10 MED ORDER — MORPHINE SULFATE (PF) 2 MG/ML IV SOLN
1.0000 mg | INTRAVENOUS | Status: DC | PRN
  Administered 2020-04-10: 1 mg via INTRAVENOUS
  Filled 2020-04-10: qty 1

## 2020-04-10 MED ORDER — METHYLPREDNISOLONE SODIUM SUCC 40 MG IJ SOLR
40.0000 mg | Freq: Three times a day (TID) | INTRAMUSCULAR | Status: DC
  Administered 2020-04-10 – 2020-04-16 (×18): 40 mg via INTRAVENOUS
  Filled 2020-04-10 (×17): qty 1

## 2020-04-10 MED ORDER — SODIUM CHLORIDE 0.9 % IV SOLN: INTRAVENOUS | Status: DC

## 2020-04-10 NOTE — ED Notes (Signed)
Ordered breakfast 

## 2020-04-10 NOTE — ED Notes (Addendum)
Contacted attending regarding Remdesivir. Pt ALT >220, therefore it was d/c.

## 2020-04-10 NOTE — ED Notes (Signed)
Dinner ordered 

## 2020-04-10 NOTE — Progress Notes (Signed)
PROGRESS NOTE                                                                                                                                                                                                             Patient Demographics:    [name], is a 79 y.o. female, DOB - Aug 21, 1940, XBD:[id number]  Admit date - 03/31/2020   Admitting Physician Mckinley Jewel, MD  Outpatient Primary MD for the patient is [name], MD  LOS - 1   Chief Complaint  Patient presents with  . Shortness of Breath       Brief Narrative     [name] is a 79 y.o. female with medical history significant of hypertension, migraine headache, GERD, hyperlipidemia presents to emergency department with worsening shortness of breath.  Patient reports shortness of breath, cough, fever, chills, fatigue, generalized weakness, no energy, loss of sense of smell and taste, decreased p.o. intake since 1 week.  Patient tells me that she is fully vaccinated against COVID-19 however she recently exposed to her son who had contracted Covid infection at work.  Patient reports that her cough and fever has improved however due to worsening shortness of breath she came to ER for further evaluation and management.  Denies headache, blurry vision, lightheadedness, dizziness, chest pain, vomiting, abdominal pain, swelling of legs, history of DVT/PE, immobilization, urinary or bowel changes.  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  No history of smoking, alcohol, licit drug use.   Upon arrival to ED: Patient tachypneic, hypoxemic requiring nonrebreather and then placed on 4 L of oxygen via nasal cannula, she tested positive for COVID-19.  She is afebrile with leukocytosis of 15.9, lactic acid: 2.1, CMP shows AST of 91, ALT: 63, D-dimer: 2.36, procalcitonin: 0.72, CRP: 36, chest x-ray shows diffuse increased interstitial  markings throughout both lungs which may reflect edema versus atypical/viral infection.  Patient received Decadron, albuterol, Tylenol in ED.  [facility] consulted for admission for acute hypoxemic respiratory failure secondary to COVID-19 pneumonia.   Subjective:    [name] today she is complaining of epigastric abdominal pain , as well has some nausea , poor generalized weakness and fatigue as well, she does report poor appetite .   Assessment  & Plan :    Principal Problem:  Acute hypoxemic respiratory failure due to COVID-19 [facility]) Active Problems:   Hyperlipidemia   Anxiety and depression   Migraine headache   Essential hypertension   GERD   Elevated liver enzymes    Acute hypoxemic respiratory failure secondary to COVID-19 pneumonia: -Patient presented with tachypnea, hypoxia-initially requiring nonrebreather, she is currently on 5 L nasal cannula -Continue with IV steroids, she is on IV Solu-Medrol. -Started on IV remdesivir, but this has been held due to elevated LFTs -Received Decadron and albuterol breathing treatment and Tylenol in ED. -Was encouraged to use incentive spirometry, flutter valve, -Continue with antitussives -To inflammatory markers closely, CRP significantly elevated at 36, D-dimers at 2.3. -She is on IV Rocephin and azithromycin given procalcitonin of 0.7  COVID-19 Labs  Recent Labs    04/03/2020 1302 04/10/20 0518  DDIMER 2.36* 2.43*  FERRITIN 486* 1,490*  LDH 421*  --   CRP 36.3*  --     Lab Results  Component Value Date   SARSCOV2NAA POSITIVE (A) 03/23/2020   Elevated liver enzymes:  -Liver enzymes significantly trended up today, ALT 275, AST 504, it is trending up, this is most likely in the setting of hypotension on admission with liver shock, as well possibly due to Covid, I have stopped remdesivir. -We will check right upper quadrant ultrasound.  Abdominal pain -Patient reports significant epigastric abdominal pain this  afternoon, I will check lipase, LFTs, CBC, BMP.  Hypertension: -On the lower side, continue to hold losartan  GERD: Continue PPI  Hyperlipidemia: Continue statin  Migraine headache: Continue Imitrex  Chronic back pain: Tramadol daily  Code Status : Full  Family Communication: discussed with the granddaughter [name]  Disposition Plan  :  Status is: Inpatient  Remains inpatient appropriate because:Hemodynamically unstable and IV treatments appropriate due to intensity of illness or inability to take PO   Dispo: The patient is from: Home              Anticipated d/c is to: Home              Anticipated d/c date is: > 3 days              Patient currently is medically stable to d/c.      Consults  :  none  Procedures  : none  DVT Prophylaxis  :  Taylor lovenox  Lab Results  Component Value Date   PLT 364 04/10/2020     Antibiotics  :    Anti-infectives (From admission, onward)   Start     Dose/Rate Route Frequency Ordered Stop   04/10/20 1000  remdesivir 100 mg in sodium chloride 0.9 % 100 mL IVPB  Status:  Discontinued       "Followed by" Linked Group Details   100 mg 200 mL/hr over 30 Minutes Intravenous Daily 03/31/2020 1551 04/10/20 0800   03/25/2020 2200  hydroxychloroquine (PLAQUENIL) tablet 200 mg        200 mg Oral 2 times daily 04/14/2020 1653     03/30/2020 1600  remdesivir 200 mg in sodium chloride 0.9% 250 mL IVPB       "Followed by" Linked Group Details   200 mg 580 mL/hr over 30 Minutes Intravenous Once 03/29/2020 1551 04/01/2020 2139   04/08/2020 1600  azithromycin (ZITHROMAX) 500 mg in sodium chloride 0.9 % 250 mL IVPB        500 mg 250 mL/hr over 60 Minutes Intravenous Every 24 hours 04/08/2020 1552     03/31/2020 1600  cefTRIAXone (ROCEPHIN) 1 g in sodium chloride 0.9 % 100 mL IVPB        1 g 200 mL/hr over 30 Minutes Intravenous Every 24 hours 04/01/2020 1552          Objective:   Vitals:   04/10/20 1400 04/10/20 1430 04/10/20 1500 04/10/20 1532  BP:  131/75 (!) 150/63 (!) 145/65 (!) 125/56  Pulse: 93 89 87 88  Resp: (!) 36 (!) 21 (!) 45 (!) 45  Temp:      TempSrc:      SpO2: 92% 97% 90% 90%  Weight:      Height:        Wt Readings from Last 3 Encounters:  03/24/2020 72.5 kg  10/22/19 73.4 kg  04/16/19 73.9 kg    No intake or output data in the 24 hours ending 04/10/20 1549   Physical Exam  Awake Alert, Oriented X 3, No new F.N deficits, Normal affect Symmetrical Chest wall movement, Good air movement bilaterally, CTAB RRR,No Gallops,Rubs or new Murmurs, No Parasternal Heave +ve B.Sounds, Abd Soft, epigastric tenderness to palpation, No rebound - guarding or rigidity. No Cyanosis, Clubbing or edema, No new Rash or bruise     Data Review:    CBC Recent Labs  Lab 03/18/2020 1248 04/10/20 0518  WBC 15.9* 15.4*  HGB 12.3 13.1  HCT 38.4 39.8  PLT 367 364  MCV 91.9 91.7  MCH 29.4 30.2  MCHC 32.0 32.9  RDW 13.1 13.1  LYMPHSABS 0.8 0.7  MONOABS 0.4 0.2  EOSABS 0.0 0.0  BASOSABS 0.1 0.1    Chemistries  Recent Labs  Lab 03/17/2020 1248 03/29/2020 1930 04/10/20 0518  NA 137  --  138  K 3.9  --  3.7  CL 96*  --  99  CO2 26  --  25  GLUCOSE 141*  --  155*  BUN 24*  --  30*  CREATININE 0.97 1.07* 1.00  CALCIUM 9.1  --  9.1  MG  --   --  2.9*  AST 91*  --  504*  ALT 63*  --  275*  ALKPHOS 80  --  118  BILITOT 0.8  --  0.8   ------------------------------------------------------------------------------------------------------------------ Recent Labs    03/26/2020 1302  TRIG 113    Lab Results  Component Value Date   HGBA1C 5.6 10/22/2019   ------------------------------------------------------------------------------------------------------------------ No results for input(s): TSH, T4TOTAL, T3FREE, THYROIDAB in the last 72 hours.  Invalid input(s): FREET3 ------------------------------------------------------------------------------------------------------------------ Recent Labs    03/20/2020 1302  04/10/20 0518  FERRITIN 486* 1,490*    Coagulation profile No results for input(s): INR, PROTIME in the last 168 hours.  Recent Labs    04/02/2020 1302 04/10/20 0518  DDIMER 2.36* 2.43*    Cardiac Enzymes No results for input(s): CKMB, TROPONINI, MYOGLOBIN in the last 168 hours.  Invalid input(s): CK ------------------------------------------------------------------------------------------------------------------    Component Value Date/Time   BNP 86.1 04/07/2020 1302    Inpatient Medications  Scheduled Meds: . AeroChamber Plus Flo-Vu Large  1 each Other Once  . albuterol  2 puff Inhalation Q6H  . vitamin C  500 mg Oral Daily  . atorvastatin  20 mg Oral Daily  . enoxaparin (LOVENOX) injection  40 mg Subcutaneous Q24H  . gabapentin  300 mg Oral BID  . hydroxychloroquine  200 mg Oral BID  . methylPREDNISolone (SOLU-MEDROL) injection  40 mg Intravenous Q8H  . pantoprazole  40 mg Oral Daily  . zinc sulfate  220 mg Oral Daily  Continuous Infusions: . sodium chloride 50 mL/hr at 04/10/20 1522  . azithromycin Stopped (03/29/2020 1906)  . cefTRIAXone (ROCEPHIN)  IV 1 g (04/10/20 1523)   PRN Meds:.acetaminophen, chlorpheniramine-HYDROcodone, guaiFENesin-dextromethorphan, morphine injection, ondansetron **OR** ondansetron (ZOFRAN) IV, traMADol  Micro Results Recent Results (from the past 240 hour(s))  SARS Coronavirus 2 by RT PCR (hospital order, performed in [place] hospital lab) Nasopharyngeal Nasopharyngeal Swab     Status: Abnormal   Collection Time: 04/12/2020 12:38 PM   Specimen: Nasopharyngeal Swab  Result Value Ref Range Status   SARS Coronavirus 2 POSITIVE (A) NEGATIVE Final    Comment: RESULT CALLED TO, READ BACK BY AND VERIFIED WITH: Precious Reel RN 15:30 03/16/2020 ([email or web address]) (NOTE) SARS-CoV-2 target nucleic acids are DETECTED  SARS-CoV-2 RNA is generally detectable in upper respiratory specimens  during the acute phase of infection.  Positive results are  indicative  of the presence of the identified virus, but do not rule out bacterial infection or co-infection with other pathogens not detected by the test.  Clinical correlation with patient history and  other diagnostic information is necessary to determine patient infection status.  The expected result is negative.  Fact Sheet for Patients:   StrictlyIdeas.no   Fact Sheet for Healthcare Providers:   BankingDealers.co.za    This test is not yet approved or cleared by the [place] FDA and  has been authorized for detection and/or diagnosis of SARS-CoV-2 by FDA under an Emergency Use Authorization (EUA).  This EUA will remain in effect (meaning this  test can be used) for the duration of  the COVID-19 declaration under Section 564(b)(1) of the Act, 21 U.S.C. section 360-bbb-3(b)(1), unless the authorization is terminated or revoked sooner.  Performed at [facility], [place] [address], [place], [place] [address]   Blood Culture (routine x 2)     Status: None (Preliminary result)   Collection Time: 04/02/2020  1:02 PM   Specimen: BLOOD  Result Value Ref Range Status   Specimen Description BLOOD LEFT ANTECUBITAL  Final   Special Requests   Final    BOTTLES DRAWN AEROBIC AND ANAEROBIC Blood Culture results may not be optimal due to an inadequate volume of blood received in culture bottles   Culture   Final    NO GROWTH < 24 HOURS Performed at [facility], [place] [address], [place], [place] [address]    Report Status PENDING  Incomplete  Blood Culture (routine x 2)     Status: None (Preliminary result)   Collection Time: 03/21/2020  1:07 PM   Specimen: BLOOD  Result Value Ref Range Status   Specimen Description BLOOD RIGHT ANTECUBITAL  Final   Special Requests   Final    BOTTLES DRAWN AEROBIC ONLY Blood Culture results may not be optimal due to an inadequate volume of blood received in culture bottles   Culture   Final     NO GROWTH < 24 HOURS Performed at [facility], [place] [address], [place], [place] [address]    Report Status PENDING  Incomplete    Radiology Reports DG Chest Portable 1 View  Result Date: 03/19/2020 CLINICAL DATA:  Shortness of breath.  COVID-19 exposure EXAM: PORTABLE CHEST 1 VIEW COMPARISON:  01/13/2012 FINDINGS: The heart size and mediastinal contours are within normal limits. Atherosclerotic calcification of the aortic knob. Diffuse increased interstitial markings throughout both lungs. No pleural effusion or pneumothorax. The visualized skeletal structures are unremarkable. IMPRESSION: Diffuse increased interstitial markings throughout both lungs, which  may reflect edema versus atypical/viral infection. Electronically Signed   By: Davina Poke D.O.   On: 03/19/2020 13:36   DG Abd Portable 1V  Result Date: 04/10/2020 CLINICAL DATA:  Abdominal pain. EXAM: PORTABLE ABDOMEN - 1 VIEW COMPARISON:  None. FINDINGS: The bowel gas pattern is normal. No radio-opaque calculi or other significant radiographic abnormality are seen. IMPRESSION: Negative. Electronically Signed   By: Marijo Conception M.D.   On: 04/10/2020 15:17     Phillips Climes M.D on 04/10/2020 at 3:49 PM    [facility] -  Office  [phone]

## 2020-04-10 NOTE — ED Notes (Signed)
Dinner delivered.

## 2020-04-10 NOTE — ED Notes (Signed)
Lunch delivered. 

## 2020-04-11 ENCOUNTER — Inpatient Hospital Stay (HOSPITAL_COMMUNITY): Payer: Medicare Other

## 2020-04-11 ENCOUNTER — Encounter (HOSPITAL_COMMUNITY): Payer: Self-pay | Admitting: Internal Medicine

## 2020-04-11 DIAGNOSIS — I10 Essential (primary) hypertension: Secondary | ICD-10-CM

## 2020-04-11 DIAGNOSIS — R7989 Other specified abnormal findings of blood chemistry: Secondary | ICD-10-CM

## 2020-04-11 DIAGNOSIS — U071 COVID-19: Secondary | ICD-10-CM

## 2020-04-11 LAB — COMPREHENSIVE METABOLIC PANEL
ALT: 320 U/L — ABNORMAL HIGH (ref 0–44)
AST: 348 U/L — ABNORMAL HIGH (ref 15–41)
Albumin: 2.4 g/dL — ABNORMAL LOW (ref 3.5–5.0)
Alkaline Phosphatase: 104 U/L (ref 38–126)
Anion gap: 14 (ref 5–15)
BUN: 40 mg/dL — ABNORMAL HIGH (ref 8–23)
CO2: 22 mmol/L (ref 22–32)
Calcium: 8.5 mg/dL — ABNORMAL LOW (ref 8.9–10.3)
Chloride: 106 mmol/L (ref 98–111)
Creatinine, Ser: 1.04 mg/dL — ABNORMAL HIGH (ref 0.44–1.00)
GFR calc Af Amer: 60 mL/min — ABNORMAL LOW (ref >60)
GFR calc non Af Amer: 51 mL/min — ABNORMAL LOW (ref >60)
Glucose, Bld: 160 mg/dL — ABNORMAL HIGH (ref 70–99)
Potassium: 3.8 mmol/L (ref 3.5–5.1)
Sodium: 142 mmol/L (ref 135–145)
Total Bilirubin: 0.7 mg/dL (ref 0.3–1.2)
Total Protein: 6 g/dL — ABNORMAL LOW (ref 6.5–8.1)

## 2020-04-11 LAB — CBC WITH DIFFERENTIAL/PLATELET
Abs Immature Granulocytes: 0.27 10*3/uL — ABNORMAL HIGH (ref 0.00–0.07)
Basophils Absolute: 0 10*3/uL (ref 0.0–0.1)
Basophils Relative: 0 %
Eosinophils Absolute: 0 10*3/uL (ref 0.0–0.5)
Eosinophils Relative: 0 %
HCT: 33.6 % — ABNORMAL LOW (ref 36.0–46.0)
Hemoglobin: 10.9 g/dL — ABNORMAL LOW (ref 12.0–15.0)
Immature Granulocytes: 2 %
Lymphocytes Relative: 6 %
Lymphs Abs: 0.9 10*3/uL (ref 0.7–4.0)
MCH: 29.5 pg (ref 26.0–34.0)
MCHC: 32.4 g/dL (ref 30.0–36.0)
MCV: 91.1 fL (ref 80.0–100.0)
Monocytes Absolute: 0.4 10*3/uL (ref 0.1–1.0)
Monocytes Relative: 3 %
Neutro Abs: 14.7 10*3/uL — ABNORMAL HIGH (ref 1.7–7.7)
Neutrophils Relative %: 89 %
Platelets: 387 10*3/uL (ref 150–400)
RBC: 3.69 MIL/uL — ABNORMAL LOW (ref 3.87–5.11)
RDW: 13.2 % (ref 11.5–15.5)
WBC: 16.3 10*3/uL — ABNORMAL HIGH (ref 4.0–10.5)
nRBC: 0 % (ref 0.0–0.2)

## 2020-04-11 LAB — D-DIMER, QUANTITATIVE: D-Dimer, Quant: 4.28 ug/mL-FEU — ABNORMAL HIGH (ref 0.00–0.50)

## 2020-04-11 LAB — FERRITIN: Ferritin: 815 ng/mL — ABNORMAL HIGH (ref 11–307)

## 2020-04-11 LAB — PROCALCITONIN: Procalcitonin: 0.56 ng/mL

## 2020-04-11 LAB — MAGNESIUM: Magnesium: 2.8 mg/dL — ABNORMAL HIGH (ref 1.7–2.4)

## 2020-04-11 MED ORDER — ENOXAPARIN SODIUM 300 MG/3ML IJ SOLN
35.0000 mg | Freq: Two times a day (BID) | INTRAMUSCULAR | Status: DC
  Administered 2020-04-11 – 2020-04-12 (×3): 35 mg via SUBCUTANEOUS
  Filled 2020-04-11 (×5): qty 0.35

## 2020-04-11 MED ORDER — BARICITINIB 2 MG PO TABS
2.0000 mg | ORAL_TABLET | Freq: Every day | ORAL | Status: DC
  Administered 2020-04-11 – 2020-04-16 (×6): 2 mg via ORAL
  Filled 2020-04-11 (×7): qty 1

## 2020-04-11 NOTE — Evaluation (Signed)
Physical Therapy Evaluation Patient Details Name: [name] MRN: [id number] DOB: [birth year] Today's Date: 04/11/2020   History of Present Illness  79 y.o. female with medical history significant of hypertension, migraine headache, GERD, hyperlipidemia presented to emergency department with worsening shortness of breath. Pt admitted for acute hypoxemic resp failure due to COVID.    Clinical Impression  Pt admitted with above diagnosis. PTA pt lived at home with her husband, independent and active. On eval, she required supervision bed mobility, min guard assist transfers, and min guard assist ambulation 10' x 2 in room (furniture walking). SpO2 89% at rest on 10L. Desat to 85% during activity. 1/4 DOE noted. HR and RR stable. Pt currently with functional limitations due to the deficits listed below (see PT Problem List). Pt will benefit from skilled PT to increase their independence and safety with mobility to allow discharge to the venue listed below.       Follow Up Recommendations No PT follow up    Equipment Recommendations  None recommended by PT    Recommendations for Other Services       Precautions / Restrictions Precautions Precautions: Fall      Mobility  Bed Mobility Overal bed mobility: Needs Assistance Bed Mobility: Supine to Sit     Supine to sit: HOB elevated;Supervision     General bed mobility comments: +rail, supervision for safety  Transfers Overall transfer level: Needs assistance Equipment used: None Transfers: Sit to/from [employer] Transfers Sit to Stand: Min guard Stand pivot transfers: Min guard       General transfer comment: min guard for safety, increased time to stabilize balance with initial stance OOB  Ambulation/Gait Ambulation/Gait assistance: Min guard Gait Distance (Feet): 10 Feet (x 2) Assistive device: None Gait Pattern/deviations: Step-through pattern     General Gait Details: furniture walking in room  Stairs             Wheelchair Mobility    Modified Rankin (Stroke Patients Only)       Balance Overall balance assessment: Mild deficits observed, not formally tested                                           Pertinent Vitals/Pain Pain Assessment: No/denies pain    Home Living Family/patient expects to be discharged to:: Private residence Living Arrangements: Spouse/significant other Available Help at Discharge: Family;Available 24 hours/day Type of Home: House                Prior Function Level of Independence: Independent               Hand Dominance        Extremity/Trunk Assessment   Upper Extremity Assessment Upper Extremity Assessment: Generalized weakness    Lower Extremity Assessment Lower Extremity Assessment: Generalized weakness    Cervical / Trunk Assessment Cervical / Trunk Assessment: Kyphotic  Communication   Communication: No difficulties  Cognition Arousal/Alertness: Awake/alert Behavior During Therapy: WFL for tasks assessed/performed Overall Cognitive Status: Within Functional Limits for tasks assessed                                        General Comments General comments (skin integrity, edema, etc.): SpO2 89% on 10L at rest. Desat to 85% during mobility. HR in 80s. RR  in 22s. 1/4 DOE    Exercises     Assessment/Plan    PT Assessment Patient needs continued PT services  PT Problem List Decreased strength;Decreased mobility;Decreased balance;Decreased activity tolerance;Cardiopulmonary status limiting activity       PT Treatment Interventions Therapeutic activities;Gait training;Therapeutic exercise;Patient/family education;Balance training;Functional mobility training    PT Goals (Current goals can be found in the Care Plan section)  Acute Rehab PT Goals Patient Stated Goal: home PT Goal Formulation: With patient Time For Goal Achievement: 04/25/20 Potential to Achieve Goals: Good     Frequency Min 3X/week   Barriers to discharge        Co-evaluation               AM-PAC PT "6 Clicks" Mobility  Outcome Measure Help needed turning from your back to your side while in a flat bed without using bedrails?: None Help needed moving from lying on your back to sitting on the side of a flat bed without using bedrails?: None Help needed moving to and from a bed to a chair (including a wheelchair)?: A Little Help needed standing up from a chair using your arms (e.g., wheelchair or bedside chair)?: A Little Help needed to walk in hospital room?: A Little Help needed climbing 3-5 steps with a railing? : A Lot 6 Click Score: 19    End of Session Equipment Utilized During Treatment: Oxygen Activity Tolerance: Patient tolerated treatment well Patient left: in chair;with call bell/phone within reach Nurse Communication: Mobility status PT Visit Diagnosis: Unsteadiness on feet (R26.81);Difficulty in walking, not elsewhere classified (R26.2);Muscle weakness (generalized) (M62.81)    Time: 1040-1108 PT Time Calculation (min) (ACUTE ONLY): 28 min   Charges:   PT Evaluation $PT Eval Moderate Complexity: 1 Mod PT Treatments $Gait Training: 8-22 mins        Lorrin Goodell, PT  Office # [phone] Pager [phone]   Lorriane Shire 04/11/2020, 12:23 PM

## 2020-04-11 NOTE — Progress Notes (Signed)
Bilateral lower extremity venous duplex has been completed. Preliminary results can be found in CV Proc through chart review.   04/11/20 1:05 PM Carlos Levering RVT

## 2020-04-11 NOTE — Progress Notes (Signed)
Spoke with grand daughterShannie Kontos) and updated on plan of care

## 2020-04-11 NOTE — Progress Notes (Addendum)
PROGRESS NOTE                                                                                                                                                                                                             Patient Demographics:    [name], is a 79 y.o. female, DOB - [birth year], FGH:[id number]  Admit date - 03/30/2020   Admitting Physician Mckinley Jewel, MD  Outpatient Primary MD for the patient is [name], MD  LOS - 2   Chief Complaint  Patient presents with  . Shortness of Breath       Brief Narrative     [name] is a 79 y.o. female with medical history significant of hypertension, migraine headache, GERD, hyperlipidemia presents to emergency department with worsening shortness of breath.  Patient reports shortness of breath, cough, fever, chills, fatigue, generalized weakness, no energy, loss of sense of smell and taste, decreased p.o. intake since 1 week.  Patient tells me that she is fully vaccinated against COVID-19 however she recently exposed to her son who had contracted Covid infection at work.  Patient reports that her cough and fever has improved however due to worsening shortness of breath she came to ER for further evaluation and management.  Denies headache, blurry vision, lightheadedness, dizziness, chest pain, vomiting, abdominal pain, swelling of legs, history of DVT/PE, immobilization, urinary or bowel changes.  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  No history of smoking, alcohol, licit drug use.   Upon arrival to ED: Patient tachypneic, hypoxemic requiring nonrebreather and then placed on 4 L of oxygen via nasal cannula, she tested positive for COVID-19.  She is afebrile with leukocytosis of 15.9, lactic acid: 2.1, CMP shows AST of 91, ALT: 63, D-dimer: 2.36, procalcitonin: 0.72, CRP: 36, chest x-ray shows diffuse increased interstitial  markings throughout both lungs which may reflect edema versus atypical/viral infection.  Patient received Decadron, albuterol, Tylenol in ED.  [facility] hospitalist consulted for admission for acute hypoxemic respiratory failure secondary to COVID-19 pneumonia.   Subjective:    [name] today she is complaining of epigastric abdominal pain , as well has some nausea , poor generalized weakness and fatigue as well, she does report poor appetite .   Assessment  & Plan :    Principal Problem:  Acute hypoxemic respiratory failure due to COVID-19 [facility]) Active Problems:   Hyperlipidemia   Anxiety and depression   Migraine headache   Essential hypertension   GERD   Elevated liver enzymes    Acute hypoxemic respiratory failure secondary to COVID-19 pneumonia: -Patient presented with tachypnea, oxygen requirement this morning, she is requiring 10 L nasal cannula . -Continue with IV steroids, she is on IV Solu-Medrol. -Started on IV remdesivir, but this has been held due to elevated LFTs -Received Decadron and albuterol breathing treatment and Tylenol in ED. -Was encouraged to use incentive spirometry, flutter valve, -Continue with antitussives -To inflammatory markers closely. -She is on IV Rocephin and azithromycin given procalcitonin of 0.7, procalcitonin is trending down. -D-dimers trending up, it is 4.2 today, will change her Lovenox to 0.5 mg/kg every 12 hours. -I have discussed with the patient, no history of diverticulitis, tuberculosis or viral hepatitis infection, no active malignancy or immunosuppressive therapy, I have explained risks and benefits, she will be started on barsitinib. COVID-19 Labs  Recent Labs    03/18/2020 1302 04/10/20 0518 04/10/20 1526 04/11/20 0251  DDIMER 2.36* 2.43*  --  4.28*  FERRITIN 486* 1,490*  --  815*  LDH 421*  --   --   --   CRP 36.3*  --  28.4*  --     Lab Results  Component Value Date   SARSCOV2NAA POSITIVE (A) 04/12/2020   Elevated  liver enzymes:  -Main significantly elevated, but trending down,this is most likely in the setting of hypotension on admission with liver shock, as well possibly due to Covid, Remdesivir remains on hold -No acute findings abdominal ultrasound  Abdominal pain -Totally resolved, abdomen exam is benign today, lipase within normal limits, no acute findings on abdominal ultrasound .  Hypertension: -Remains on the lower side, continue to hold losartan  GERD: Continue PPI  Hyperlipidemia: Continue statin  Migraine headache: Continue Imitrex  Chronic back pain: Tramadol daily  Code Status : Full  Family Communication: discussed with the granddaughter [name] daily  Disposition Plan  :  Status is: Inpatient  Remains inpatient appropriate because:Hemodynamically unstable and IV treatments appropriate due to intensity of illness or inability to take PO   Dispo: The patient is from: Home              Anticipated d/c is to: Home              Anticipated d/c date is: > 3 days              Patient currently is medically stable to d/c.      Consults  :  none  Procedures  : none  DVT Prophylaxis  :  [place] lovenox  Lab Results  Component Value Date   PLT 387 04/11/2020     Antibiotics  :    Anti-infectives (From admission, onward)   Start     Dose/Rate Route Frequency Ordered Stop   04/10/20 1000  remdesivir 100 mg in sodium chloride 0.9 % 100 mL IVPB  Status:  Discontinued       "Followed by" Linked Group Details   100 mg 200 mL/hr over 30 Minutes Intravenous Daily 04/03/2020 1551 04/10/20 0800   04/04/2020 2200  hydroxychloroquine (PLAQUENIL) tablet 200 mg        200 mg Oral 2 times daily 03/17/2020 1653     04/15/2020 1600  remdesivir 200 mg in sodium chloride 0.9% 250 mL IVPB       "Followed by"  Linked Group Details   200 mg 580 mL/hr over 30 Minutes Intravenous Once 04/05/2020 1551 04/13/2020 2139   03/23/2020 1600  azithromycin (ZITHROMAX) 500 mg in sodium chloride 0.9 % 250 mL  IVPB        500 mg 250 mL/hr over 60 Minutes Intravenous Every 24 hours 04/15/2020 1552     04/02/2020 1600  cefTRIAXone (ROCEPHIN) 1 g in sodium chloride 0.9 % 100 mL IVPB        1 g 200 mL/hr over 30 Minutes Intravenous Every 24 hours 04/03/2020 1552          Objective:   Vitals:   04/10/20 2024 04/10/20 2200 04/11/20 0032 04/11/20 0802  BP: (!) 126/57  (!) 111/43 (!) 90/41  Pulse: 85  79 75  Resp: 18  18   Temp: 98.2 F (36.8 C)  97.8 F (36.6 C) 98.1 F (36.7 C)  TempSrc: Oral   Oral  SpO2: 94%  92%   Weight:  73 kg    Height:  5\' 4"  (1.626 m)      Wt Readings from Last 3 Encounters:  04/10/20 73 kg  10/22/19 73.4 kg  04/16/19 73.9 kg     Intake/Output Summary (Last 24 hours) at 04/11/2020 1126 Last data filed at 04/10/2020 2240 Gross per 24 hour  Intake 350 ml  Output 400 ml  Net -50 ml     Physical Exam  Awake Alert, Oriented X 3, No new F.N deficits, Normal affect Symmetrical Chest wall movement, Good air movement bilaterally, CTAB RRR,No Gallops,Rubs or new Murmurs, No Parasternal Heave +ve B.Sounds, Abd Soft, No abdominal tenderness, No rebound - guarding or rigidity. No Cyanosis, Clubbing or edema, No new Rash or bruise      Data Review:    CBC Recent Labs  Lab 04/07/2020 1248 04/10/20 0518 04/10/20 1526 04/11/20 0251  WBC 15.9* 15.4* 19.8* 16.3*  HGB 12.3 13.1 13.3 10.9*  HCT 38.4 39.8 41.1 33.6*  PLT 367 364 423* 387  MCV 91.9 91.7 92.2 91.1  MCH 29.4 30.2 29.8 29.5  MCHC 32.0 32.9 32.4 32.4  RDW 13.1 13.1 13.1 13.2  LYMPHSABS 0.8 0.7 0.8 0.9  MONOABS 0.4 0.2 0.4 0.4  EOSABS 0.0 0.0 0.3 0.0  BASOSABS 0.1 0.1 0.1 0.0    Chemistries  Recent Labs  Lab 04/10/2020 1248 04/05/2020 1930 04/10/20 0518 04/10/20 1526 04/11/20 0251  NA 137  --  138 142 142  K 3.9  --  3.7 4.2 3.8  CL 96*  --  99 103 106  CO2 26  --  25 23 22   GLUCOSE 141*  --  155* 146* 160*  BUN 24*  --  30* 37* 40*  CREATININE 0.97 1.07* 1.00 0.98 1.04*  CALCIUM 9.1  --   9.1 9.1 8.5*  MG  --   --  2.9*  --  2.8*  AST 91*  --  504* 697* 348*  ALT 63*  --  275* 474* 320*  ALKPHOS 80  --  118 134* 104  BILITOT 0.8  --  0.8 1.4* 0.7   ------------------------------------------------------------------------------------------------------------------ Recent Labs    03/28/2020 1302  TRIG 113    Lab Results  Component Value Date   HGBA1C 5.6 10/22/2019   ------------------------------------------------------------------------------------------------------------------ No results for input(s): TSH, T4TOTAL, T3FREE, THYROIDAB in the last 72 hours.  Invalid input(s): FREET3 ------------------------------------------------------------------------------------------------------------------ Recent Labs    04/10/20 0518 04/11/20 0251  FERRITIN 1,490* 815*    Coagulation profile No results for input(s): INR,  PROTIME in the last 168 hours.  Recent Labs    04/10/20 0518 04/11/20 0251  DDIMER 2.43* 4.28*    Cardiac Enzymes No results for input(s): CKMB, TROPONINI, MYOGLOBIN in the last 168 hours.  Invalid input(s): CK ------------------------------------------------------------------------------------------------------------------    Component Value Date/Time   BNP 86.1 04/10/2020 1302    Inpatient Medications  Scheduled Meds: . AeroChamber Plus Flo-Vu Large  1 each Other Once  . albuterol  2 puff Inhalation Q6H  . vitamin C  500 mg Oral Daily  . atorvastatin  20 mg Oral Daily  . enoxaparin (LOVENOX) injection  35 mg Subcutaneous Q12H  . gabapentin  300 mg Oral BID  . hydroxychloroquine  200 mg Oral BID  . methylPREDNISolone (SOLU-MEDROL) injection  40 mg Intravenous Q8H  . pantoprazole  40 mg Oral Daily  . zinc sulfate  220 mg Oral Daily   Continuous Infusions: . sodium chloride 50 mL/hr at 04/10/20 2100  . azithromycin Stopped (04/10/20 1848)  . cefTRIAXone (ROCEPHIN)  IV Stopped (04/10/20 1609)   PRN Meds:.acetaminophen,  chlorpheniramine-HYDROcodone, guaiFENesin-dextromethorphan, morphine injection, ondansetron **OR** ondansetron (ZOFRAN) IV, traMADol  Micro Results Recent Results (from the past 240 hour(s))  SARS Coronavirus 2 by RT PCR (hospital order, performed in [facility] hospital lab) Nasopharyngeal Nasopharyngeal Swab     Status: Abnormal   Collection Time: 04/10/2020 12:38 PM   Specimen: Nasopharyngeal Swab  Result Value Ref Range Status   SARS Coronavirus 2 POSITIVE (A) NEGATIVE Final    Comment: RESULT CALLED TO, READ BACK BY AND VERIFIED WITH: Precious Reel RN 15:30 04/08/2020 ([email or web address]) (NOTE) SARS-CoV-2 target nucleic acids are DETECTED  SARS-CoV-2 RNA is generally detectable in upper respiratory specimens  during the acute phase of infection.  Positive results are indicative  of the presence of the identified virus, but do not rule out bacterial infection or co-infection with other pathogens not detected by the test.  Clinical correlation with patient history and  other diagnostic information is necessary to determine patient infection status.  The expected result is negative.  Fact Sheet for Patients:   StrictlyIdeas.no   Fact Sheet for Healthcare Providers:   BankingDealers.co.za    This test is not yet approved or cleared by the [place] FDA and  has been authorized for detection and/or diagnosis of SARS-CoV-2 by FDA under an Emergency Use Authorization (EUA).  This EUA will remain in effect (meaning this  test can be used) for the duration of  the COVID-19 declaration under Section 564(b)(1) of the Act, 21 U.S.C. section 360-bbb-3(b)(1), unless the authorization is terminated or revoked sooner.  Performed at [facility], [place] [address] [place], [place] [address]   Blood Culture (routine x 2)     Status: None (Preliminary result)   Collection Time: 04/04/2020  1:02 PM   Specimen: BLOOD  Result Value Ref Range Status    Specimen Description BLOOD LEFT ANTECUBITAL  Final   Special Requests   Final    BOTTLES DRAWN AEROBIC AND ANAEROBIC Blood Culture results may not be optimal due to an inadequate volume of blood received in culture bottles   Culture   Final    NO GROWTH 2 DAYS Performed at [facility], [place] [address], [place], [place] [address]    Report Status PENDING  Incomplete  Blood Culture (routine x 2)     Status: None (Preliminary result)   Collection Time: 03/18/2020  1:07 PM   Specimen: BLOOD  Result Value Ref Range Status  Specimen Description BLOOD RIGHT ANTECUBITAL  Final   Special Requests   Final    BOTTLES DRAWN AEROBIC ONLY Blood Culture results may not be optimal due to an inadequate volume of blood received in culture bottles   Culture   Final    NO GROWTH 2 DAYS Performed at [facility], [place] [address], [place], [place] [address]    Report Status PENDING  Incomplete    Radiology Reports US Abdomen Complete  Result Date: 04/10/2020 CLINICAL DATA:  Epigastric pain EXAM: ABDOMEN ULTRASOUND COMPLETE COMPARISON:  None. FINDINGS: Gallbladder: No gallstones or wall thickening visualized. No sonographic Murphy sign noted by sonographer. Common bile duct: Diameter: 3.3 mm Liver: Liver is echogenic. No focal hepatic abnormality. Portal vein is patent on color Doppler imaging with normal direction of blood flow towards the liver. IVC: No abnormality visualized. Pancreas: Visualized portion unremarkable. Spleen: Size and appearance within normal limits. Right Kidney: Length: 10 cm. Echogenicity within normal limits. No mass or hydronephrosis visualized. Left Kidney: Length: 10.4 cm. Echogenicity within normal limits. No hydronephrosis. Cyst off the lower pole measuring 3.1 cm. Abdominal aorta: No aneurysm visualized. Other findings: None. IMPRESSION: 1. Negative for gallstones or biliary dilatation 2. Echogenic liver consistent with steatosis and or hepatocellular disease. 3. Simple  appearing left renal cyst Electronically Signed   By: Donavan Foil M.D.   On: 04/10/2020 18:12   DG Chest Portable 1 View  Result Date: 03/18/2020 CLINICAL DATA:  Shortness of breath.  COVID-19 exposure EXAM: PORTABLE CHEST 1 VIEW COMPARISON:  01/13/2012 FINDINGS: The heart size and mediastinal contours are within normal limits. Atherosclerotic calcification of the aortic knob. Diffuse increased interstitial markings throughout both lungs. No pleural effusion or pneumothorax. The visualized skeletal structures are unremarkable. IMPRESSION: Diffuse increased interstitial markings throughout both lungs, which may reflect edema versus atypical/viral infection. Electronically Signed   By: Davina Poke D.O.   On: 04/08/2020 13:36   DG Abd Portable 1V  Result Date: 04/10/2020 CLINICAL DATA:  Abdominal pain. EXAM: PORTABLE ABDOMEN - 1 VIEW COMPARISON:  None. FINDINGS: The bowel gas pattern is normal. No radio-opaque calculi or other significant radiographic abnormality are seen. IMPRESSION: Negative. Electronically Signed   By: Marijo Conception M.D.   On: 04/10/2020 15:17     Phillips Climes M.D on 04/11/2020 at 11:26 AM    [facility] -  Office  [phone]

## 2020-04-11 NOTE — Progress Notes (Signed)
Pt O2 sats 86% on 10L HF, pt instructed to take deep breaths. Assisted to prone position sats increased to 93%. Pt resting comfortably at this time. Will cont to monitor.

## 2020-04-11 NOTE — Plan of Care (Signed)

## 2020-04-11 NOTE — Progress Notes (Signed)
The chaplain responded to a consult for an AD. The patient was not available to the chaplain because of [name]. The chaplain left the paperwork with the nurse to give to the patient. The chaplain is available for follow-up after the patient comes out of isolation.  Brion Aliment Chaplain Resident For questions concerning this note please contact me by pager [phone]

## 2020-04-12 LAB — COMPREHENSIVE METABOLIC PANEL
ALT: 257 U/L — ABNORMAL HIGH (ref 0–44)
AST: 180 U/L — ABNORMAL HIGH (ref 15–41)
Albumin: 2.4 g/dL — ABNORMAL LOW (ref 3.5–5.0)
Alkaline Phosphatase: 97 U/L (ref 38–126)
Anion gap: 11 (ref 5–15)
BUN: 33 mg/dL — ABNORMAL HIGH (ref 8–23)
CO2: 24 mmol/L (ref 22–32)
Calcium: 8.5 mg/dL — ABNORMAL LOW (ref 8.9–10.3)
Chloride: 107 mmol/L (ref 98–111)
Creatinine, Ser: 1.11 mg/dL — ABNORMAL HIGH (ref 0.44–1.00)
GFR calc Af Amer: 55 mL/min — ABNORMAL LOW (ref >60)
GFR calc non Af Amer: 48 mL/min — ABNORMAL LOW (ref >60)
Glucose, Bld: 187 mg/dL — ABNORMAL HIGH (ref 70–99)
Potassium: 3.6 mmol/L (ref 3.5–5.1)
Sodium: 142 mmol/L (ref 135–145)
Total Bilirubin: 0.4 mg/dL (ref 0.3–1.2)
Total Protein: 5.8 g/dL — ABNORMAL LOW (ref 6.5–8.1)

## 2020-04-12 LAB — CBC WITH DIFFERENTIAL/PLATELET
Abs Immature Granulocytes: 0.41 10*3/uL — ABNORMAL HIGH (ref 0.00–0.07)
Basophils Absolute: 0.1 10*3/uL (ref 0.0–0.1)
Basophils Relative: 0 %
Eosinophils Absolute: 0 10*3/uL (ref 0.0–0.5)
Eosinophils Relative: 0 %
HCT: 35 % — ABNORMAL LOW (ref 36.0–46.0)
Hemoglobin: 11.3 g/dL — ABNORMAL LOW (ref 12.0–15.0)
Immature Granulocytes: 3 %
Lymphocytes Relative: 7 %
Lymphs Abs: 1.1 10*3/uL (ref 0.7–4.0)
MCH: 30.1 pg (ref 26.0–34.0)
MCHC: 32.3 g/dL (ref 30.0–36.0)
MCV: 93.3 fL (ref 80.0–100.0)
Monocytes Absolute: 0.5 10*3/uL (ref 0.1–1.0)
Monocytes Relative: 3 %
Neutro Abs: 13.3 10*3/uL — ABNORMAL HIGH (ref 1.7–7.7)
Neutrophils Relative %: 87 %
Platelets: 410 10*3/uL — ABNORMAL HIGH (ref 150–400)
RBC: 3.75 MIL/uL — ABNORMAL LOW (ref 3.87–5.11)
RDW: 13.2 % (ref 11.5–15.5)
WBC: 15.4 10*3/uL — ABNORMAL HIGH (ref 4.0–10.5)
nRBC: 0 % (ref 0.0–0.2)

## 2020-04-12 LAB — D-DIMER, QUANTITATIVE: D-Dimer, Quant: 2.37 ug/mL-FEU — ABNORMAL HIGH (ref 0.00–0.50)

## 2020-04-12 LAB — PROCALCITONIN: Procalcitonin: 0.19 ng/mL

## 2020-04-12 LAB — MAGNESIUM: Magnesium: 2.7 mg/dL — ABNORMAL HIGH (ref 1.7–2.4)

## 2020-04-12 LAB — FERRITIN: Ferritin: 461 ng/mL — ABNORMAL HIGH (ref 11–307)

## 2020-04-12 MED ORDER — ENOXAPARIN SODIUM 40 MG/0.4ML ~~LOC~~ SOLN
40.0000 mg | Freq: Every day | SUBCUTANEOUS | Status: DC
  Administered 2020-04-13: 40 mg via SUBCUTANEOUS
  Filled 2020-04-12: qty 0.4

## 2020-04-12 MED ORDER — ENSURE ENLIVE PO LIQD
237.0000 mL | Freq: Two times a day (BID) | ORAL | Status: DC
  Administered 2020-04-12 – 2020-04-25 (×20): 237 mL via ORAL

## 2020-04-12 MED ORDER — AZITHROMYCIN 250 MG PO TABS
500.0000 mg | ORAL_TABLET | Freq: Every day | ORAL | Status: AC
  Administered 2020-04-12 – 2020-04-13 (×2): 500 mg via ORAL
  Filled 2020-04-12 (×3): qty 2

## 2020-04-12 NOTE — Progress Notes (Signed)
PROGRESS NOTE                                                                                                                                                                                                             Patient Demographics:    [name], is a 79 y.o. female, DOB - April 06, 1941, IOX:[id number]  Admit date - 03/23/2020   Admitting Physician Mckinley Jewel, MD  Outpatient Primary MD for the patient is [name], MD  LOS - 3   Chief Complaint  Patient presents with  . Shortness of Breath       Brief Narrative     [name] is a 79 y.o. female with medical history significant of hypertension, migraine headache, GERD, hyperlipidemia presents to emergency department with worsening shortness of breath.  Patient reports shortness of breath, cough, fever, chills, fatigue, generalized weakness, no energy, loss of sense of smell and taste, decreased p.o. intake since 1 week.  Patient tells me that she is fully vaccinated against COVID-19 however she recently exposed to her son who had contracted Covid infection at work.  Patient reports that her cough and fever has improved however due to worsening shortness of breath she came to ER for further evaluation and management.  Denies headache, blurry vision, lightheadedness, dizziness, chest pain, vomiting, abdominal pain, swelling of legs, history of DVT/PE, immobilization, urinary or bowel changes.  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  No history of smoking, alcohol, licit drug use.   Upon arrival to ED: Patient tachypneic, hypoxemic requiring nonrebreather and then placed on 4 L of oxygen via nasal cannula, she tested positive for COVID-19.  She is afebrile with leukocytosis of 15.9, lactic acid: 2.1, CMP shows AST of 91, ALT: 63, D-dimer: 2.36, procalcitonin: 0.72, CRP: 36, chest x-ray shows diffuse increased interstitial  markings throughout both lungs which may reflect edema versus atypical/viral infection.  Patient received Decadron, albuterol, Tylenol in ED.  [facility] consulted for admission for acute hypoxemic respiratory failure secondary to COVID-19 pneumonia.   Subjective:    [name] today she denies any abdominal pain, nausea or vomiting, but reports she feels generally weak, and has poor appetite, she still reports some dyspnea on cough .    Assessment  & Plan :    Principal Problem:  Acute hypoxemic respiratory failure due to COVID-19 [facility] Active Problems:   Hyperlipidemia   Anxiety and depression   Migraine headache   Essential hypertension   GERD   Elevated liver enzymes    Acute hypoxemic respiratory failure secondary to COVID-19 pneumonia: -Patient presented with tachypnea, oxygen requirement this morning, she is requiring 10 L nasal cannula . -Continue with IV steroids, she is on IV Solu-Medrol. -Started on IV remdesivir, but this has been held due to elevated LFTs -Received Decadron and albuterol breathing treatment and Tylenol in ED. -Was encouraged to use incentive spirometry, flutter valve, -Continue with antitussives -To inflammatory markers closely. -She is on IV Rocephin and azithromycin given procalcitonin of 0.7, procalcitonin is trending down. -D-dimers peaked at 4.2 ,on  Lovenox to 0.5 mg/kg every 12 hours. Dopplers negative for DVT, D-dimers trending down, will change Lovenox to prophylaxis dose. -I have discussed with the patient, no history of diverticulitis, tuberculosis or viral hepatitis infection, no active malignancy or immunosuppressive therapy, I have explained risks and benefits, she is started on barsitinib. -Has poor appetite, so she is started on Ensure  SpO2: 100 % O2 Flow Rate (L/min): 10 L/min   COVID-19 Labs  Recent Labs    03/28/2020 1302 03/18/2020 1302 04/10/20 0518 04/10/20 1526 04/11/20 0251 04/12/20 0109  DDIMER 2.36*   < >  2.43*  --  4.28* 2.37*  FERRITIN 486*   < > 1,490*  --  815* 461*  LDH 421*  --   --   --   --   --   CRP 36.3*  --   --  28.4*  --   --    < > = values in this interval not displayed.    Lab Results  Component Value Date   SARSCOV2NAA POSITIVE (A) 04/01/2020   Elevated liver enzymes:  -Main significantly elevated, but trending down,this is most likely in the setting of hypotension on admission with liver shock, as well possibly due to Covid, Remdesivir remains on hold -No acute findings abdominal ultrasound  Abdominal pain -Totally resolved, abdomen exam is benign today, lipase within normal limits, no acute findings on abdominal ultrasound .  Hypertension: -Remains on the lower side, continue to hold losartan  GERD: Continue PPI  Hyperlipidemia: Continue statin  Migraine headache: Continue Imitrex  Chronic back pain: Tramadol daily  Code Status : Full  Family Communication: discussed with the granddaughter Cyril Mourning daily  Disposition Plan  :  Status is: Inpatient  Remains inpatient appropriate because:Hemodynamically unstable and IV treatments appropriate due to intensity of illness or inability to take PO   Dispo: The patient is from: Home              Anticipated d/c is to: Home              Anticipated d/c date is: > 3 days              Patient currently is medically stable to d/c.      Consults  :  none  Procedures  : none  DVT Prophylaxis  :  [place] lovenox  Lab Results  Component Value Date   PLT 410 (H) 04/12/2020     Antibiotics  :    Anti-infectives (From admission, onward)   Start     Dose/Rate Route Frequency Ordered Stop   04/10/20 1000  remdesivir 100 mg in sodium chloride 0.9 % 100 mL IVPB  Status:  Discontinued       "Followed by" Linked  Group Details   100 mg 200 mL/hr over 30 Minutes Intravenous Daily 04/04/2020 1551 04/10/20 0800   04/12/2020 2200  hydroxychloroquine (PLAQUENIL) tablet 200 mg        200 mg Oral 2 times daily 04/15/2020  1653     04/08/2020 1600  remdesivir 200 mg in sodium chloride 0.9% 250 mL IVPB       "Followed by" Linked Group Details   200 mg 580 mL/hr over 30 Minutes Intravenous Once 03/22/2020 1551 04/06/2020 2139   04/07/2020 1600  azithromycin (ZITHROMAX) 500 mg in sodium chloride 0.9 % 250 mL IVPB        500 mg 250 mL/hr over 60 Minutes Intravenous Every 24 hours 04/15/2020 1552     03/17/2020 1600  cefTRIAXone (ROCEPHIN) 1 g in sodium chloride 0.9 % 100 mL IVPB        1 g 200 mL/hr over 30 Minutes Intravenous Every 24 hours 03/31/2020 1552          Objective:   Vitals:   04/11/20 1946 04/11/20 2300 04/12/20 0342 04/12/20 0740  BP: (!) 119/57 (!) 120/55 (!) 108/59 135/62  Pulse: 94 88 82 84  Resp: 16 16 16    Temp: 97.9 F (36.6 C) 97.8 F (36.6 C) 98.3 F (36.8 C) 98.3 F (36.8 C)  TempSrc: Oral Oral Axillary Oral  SpO2: 100% 100% 100%   Weight:      Height:        Wt Readings from Last 3 Encounters:  04/10/20 73 kg  10/22/19 73.4 kg  04/16/19 73.9 kg    No intake or output data in the 24 hours ending 04/12/20 1152   Physical Exam  Awake Alert, Oriented X 3, frail, No new F.N deficits, Normal affect Symmetrical Chest wall movement, Good air movement bilaterally, CTAB RRR,No Gallops,Rubs or new Murmurs, No Parasternal Heave +ve B.Sounds, Abd Soft, No tenderness, No rebound - guarding or rigidity. No Cyanosis, Clubbing or edema, No new Rash or bruise       Data Review:    CBC Recent Labs  Lab 04/15/2020 1248 04/10/20 0518 04/10/20 1526 04/11/20 0251 04/12/20 0109  WBC 15.9* 15.4* 19.8* 16.3* 15.4*  HGB 12.3 13.1 13.3 10.9* 11.3*  HCT 38.4 39.8 41.1 33.6* 35.0*  PLT 367 364 423* 387 410*  MCV 91.9 91.7 92.2 91.1 93.3  MCH 29.4 30.2 29.8 29.5 30.1  MCHC 32.0 32.9 32.4 32.4 32.3  RDW 13.1 13.1 13.1 13.2 13.2  LYMPHSABS 0.8 0.7 0.8 0.9 1.1  MONOABS 0.4 0.2 0.4 0.4 0.5  EOSABS 0.0 0.0 0.3 0.0 0.0  BASOSABS 0.1 0.1 0.1 0.0 0.1    Chemistries  Recent Labs  Lab  03/31/2020 1248 03/17/2020 1248 04/13/2020 1930 04/10/20 0518 04/10/20 1526 04/11/20 0251 04/12/20 0109  NA 137  --   --  138 142 142 142  K 3.9  --   --  3.7 4.2 3.8 3.6  CL 96*  --   --  99 103 106 107  CO2 26  --   --  25 23 22 24   GLUCOSE 141*  --   --  155* 146* 160* 187*  BUN 24*  --   --  30* 37* 40* 33*  CREATININE 0.97   < > 1.07* 1.00 0.98 1.04* 1.11*  CALCIUM 9.1  --   --  9.1 9.1 8.5* 8.5*  MG  --   --   --  2.9*  --  2.8* 2.7*  AST 91*  --   --  504* 697* 348* 180*  ALT 63*  --   --  275* 474* 320* 257*  ALKPHOS 80  --   --  118 134* 104 97  BILITOT 0.8  --   --  0.8 1.4* 0.7 0.4   < > = values in this interval not displayed.   ------------------------------------------------------------------------------------------------------------------ Recent Labs    04/07/2020 1302  TRIG 113    Lab Results  Component Value Date   HGBA1C 5.6 10/22/2019   ------------------------------------------------------------------------------------------------------------------ No results for input(s): TSH, T4TOTAL, T3FREE, THYROIDAB in the last 72 hours.  Invalid input(s): FREET3 ------------------------------------------------------------------------------------------------------------------ Recent Labs    04/11/20 0251 04/12/20 0109  FERRITIN 815* 461*    Coagulation profile No results for input(s): INR, PROTIME in the last 168 hours.  Recent Labs    04/11/20 0251 04/12/20 0109  DDIMER 4.28* 2.37*    Cardiac Enzymes No results for input(s): CKMB, TROPONINI, MYOGLOBIN in the last 168 hours.  Invalid input(s): CK ------------------------------------------------------------------------------------------------------------------    Component Value Date/Time   BNP 86.1 04/15/2020 1302    Inpatient Medications  Scheduled Meds: . AeroChamber Plus Flo-Vu Large  1 each Other Once  . albuterol  2 puff Inhalation Q6H  . vitamin C  500 mg Oral Daily  . atorvastatin  20 mg  Oral Daily  . baricitinib  2 mg Oral Daily  . enoxaparin (LOVENOX) injection  35 mg Subcutaneous Q12H  . feeding supplement (ENSURE ENLIVE)  237 mL Oral BID BM  . gabapentin  300 mg Oral BID  . hydroxychloroquine  200 mg Oral BID  . methylPREDNISolone (SOLU-MEDROL) injection  40 mg Intravenous Q8H  . pantoprazole  40 mg Oral Daily  . zinc sulfate  220 mg Oral Daily   Continuous Infusions: . sodium chloride 50 mL/hr at 04/11/20 1630  . azithromycin Stopped (04/11/20 1731)  . cefTRIAXone (ROCEPHIN)  IV Stopped (04/11/20 1820)   PRN Meds:.acetaminophen, chlorpheniramine-HYDROcodone, guaiFENesin-dextromethorphan, ondansetron **OR** ondansetron (ZOFRAN) IV, traMADol  Micro Results Recent Results (from the past 240 hour(s))  SARS Coronavirus 2 by RT PCR (hospital order, performed in [facility] hospital lab) Nasopharyngeal Nasopharyngeal Swab     Status: Abnormal   Collection Time: 03/31/2020 12:38 PM   Specimen: Nasopharyngeal Swab  Result Value Ref Range Status   SARS Coronavirus 2 POSITIVE (A) NEGATIVE Final    Comment: RESULT CALLED TO, READ BACK BY AND VERIFIED WITH: Precious Reel RN 15:30 03/31/2020 ([email or web address]) (NOTE) SARS-CoV-2 target nucleic acids are DETECTED  SARS-CoV-2 RNA is generally detectable in upper respiratory specimens  during the acute phase of infection.  Positive results are indicative  of the presence of the identified virus, but do not rule out bacterial infection or co-infection with other pathogens not detected by the test.  Clinical correlation with patient history and  other diagnostic information is necessary to determine patient infection status.  The expected result is negative.  Fact Sheet for Patients:   StrictlyIdeas.no   Fact Sheet for Healthcare Providers:   BankingDealers.co.za    This test is not yet approved or cleared by the [place] FDA and  has been authorized for detection and/or diagnosis of  SARS-CoV-2 by FDA under an Emergency Use Authorization (EUA).  This EUA will remain in effect (meaning this  test can be used) for the duration of  the COVID-19 declaration under Section 564(b)(1) of the Act, 21 U.S.C. section 360-bbb-3(b)(1), unless the authorization is terminated or revoked sooner.  Performed at [facility], [place] [address] [place], [place]  [address]   Blood Culture (routine x 2)     Status: None (Preliminary result)   Collection Time: 04/13/2020  1:02 PM   Specimen: BLOOD  Result Value Ref Range Status   Specimen Description BLOOD LEFT ANTECUBITAL  Final   Special Requests   Final    BOTTLES DRAWN AEROBIC AND ANAEROBIC Blood Culture results may not be optimal due to an inadequate volume of blood received in culture bottles   Culture   Final    NO GROWTH 3 DAYS Performed at [facility], [place] [address] [place], [place] [address]    Report Status PENDING  Incomplete  Blood Culture (routine x 2)     Status: None (Preliminary result)   Collection Time: 04/11/2020  1:07 PM   Specimen: BLOOD  Result Value Ref Range Status   Specimen Description BLOOD RIGHT ANTECUBITAL  Final   Special Requests   Final    BOTTLES DRAWN AEROBIC ONLY Blood Culture results may not be optimal due to an inadequate volume of blood received in culture bottles   Culture   Final    NO GROWTH 3 DAYS Performed at [facility], [place] [address], [place], [place] [address]    Report Status PENDING  Incomplete    Radiology Reports US Abdomen Complete  Result Date: 04/10/2020 CLINICAL DATA:  Epigastric pain EXAM: ABDOMEN ULTRASOUND COMPLETE COMPARISON:  None. FINDINGS: Gallbladder: No gallstones or wall thickening visualized. No sonographic Murphy sign noted by sonographer. Common bile duct: Diameter: 3.3 mm Liver: Liver is echogenic. No focal hepatic abnormality. Portal vein is patent on color Doppler imaging with normal direction of blood flow towards the liver. IVC: No  abnormality visualized. Pancreas: Visualized portion unremarkable. Spleen: Size and appearance within normal limits. Right Kidney: Length: 10 cm. Echogenicity within normal limits. No mass or hydronephrosis visualized. Left Kidney: Length: 10.4 cm. Echogenicity within normal limits. No hydronephrosis. Cyst off the lower pole measuring 3.1 cm. Abdominal aorta: No aneurysm visualized. Other findings: None. IMPRESSION: 1. Negative for gallstones or biliary dilatation 2. Echogenic liver consistent with steatosis and or hepatocellular disease. 3. Simple appearing left renal cyst Electronically Signed   By: Donavan Foil M.D.   On: 04/10/2020 18:12   DG Chest Portable 1 View  Result Date: 03/23/2020 CLINICAL DATA:  Shortness of breath.  COVID-19 exposure EXAM: PORTABLE CHEST 1 VIEW COMPARISON:  01/13/2012 FINDINGS: The heart size and mediastinal contours are within normal limits. Atherosclerotic calcification of the aortic knob. Diffuse increased interstitial markings throughout both lungs. No pleural effusion or pneumothorax. The visualized skeletal structures are unremarkable. IMPRESSION: Diffuse increased interstitial markings throughout both lungs, which may reflect edema versus atypical/viral infection. Electronically Signed   By: Davina Poke D.O.   On: 04/05/2020 13:36   DG Abd Portable 1V  Result Date: 04/10/2020 CLINICAL DATA:  Abdominal pain. EXAM: PORTABLE ABDOMEN - 1 VIEW COMPARISON:  None. FINDINGS: The bowel gas pattern is normal. No radio-opaque calculi or other significant radiographic abnormality are seen. IMPRESSION: Negative. Electronically Signed   By: Marijo Conception M.D.   On: 04/10/2020 15:17   VAS [place] LOWER EXTREMITY VENOUS (DVT)  Result Date: 04/11/2020  Lower Venous DVTStudy Indications: Elevated Ddimer.  Risk Factors: COVID 19 positive. Comparison Study: No prior studies. Performing Technologist: Oliver Hum RVT  Examination Guidelines: A complete evaluation includes B-mode  imaging, spectral Doppler, color Doppler, and power Doppler as needed of all accessible portions of each vessel. Bilateral testing is considered an integral part  of a complete examination. Limited examinations for reoccurring indications may be performed as noted. The reflux portion of the exam is performed with the patient in reverse Trendelenburg.  +---------+---------------+---------+-----------+----------+--------------+ RIGHT    CompressibilityPhasicitySpontaneityPropertiesThrombus Aging +---------+---------------+---------+-----------+----------+--------------+ CFV      Full           Yes      Yes                                 +---------+---------------+---------+-----------+----------+--------------+ SFJ      Full                                                        +---------+---------------+---------+-----------+----------+--------------+ FV Prox  Full                                                        +---------+---------------+---------+-----------+----------+--------------+ FV Mid   Full                                                        +---------+---------------+---------+-----------+----------+--------------+ FV DistalFull                                                        +---------+---------------+---------+-----------+----------+--------------+ PFV      Full                                                        +---------+---------------+---------+-----------+----------+--------------+ POP      Full           Yes      Yes                                 +---------+---------------+---------+-----------+----------+--------------+ PTV      Full                                                        +---------+---------------+---------+-----------+----------+--------------+ PERO     Full                                                        +---------+---------------+---------+-----------+----------+--------------+    +---------+---------------+---------+-----------+----------+--------------+ LEFT     CompressibilityPhasicitySpontaneityPropertiesThrombus Aging +---------+---------------+---------+-----------+----------+--------------+ CFV      Full  Yes      Yes                                 +---------+---------------+---------+-----------+----------+--------------+ SFJ      Full                                                        +---------+---------------+---------+-----------+----------+--------------+ FV Prox  Full                                                        +---------+---------------+---------+-----------+----------+--------------+ FV Mid   Full                                                        +---------+---------------+---------+-----------+----------+--------------+ FV DistalFull                                                        +---------+---------------+---------+-----------+----------+--------------+ PFV      Full                                                        +---------+---------------+---------+-----------+----------+--------------+ POP      Full           Yes      Yes                                 +---------+---------------+---------+-----------+----------+--------------+ PTV      Full                                                        +---------+---------------+---------+-----------+----------+--------------+ PERO     Full                                                        +---------+---------------+---------+-----------+----------+--------------+     Summary: RIGHT: - There is no evidence of deep vein thrombosis in the lower extremity.  - No cystic structure found in the popliteal fossa.  LEFT: - There is no evidence of deep vein thrombosis in the lower extremity.  - No cystic structure found in the popliteal fossa.  *See table(s) above for measurements and observations. Electronically signed  by Servando Snare MD on 04/11/2020 at 4:56:04  PM.    Final      Phillips Climes M.D on 04/12/2020 at 11:52 AM    [facility] -  Office  [phone]

## 2020-04-13 LAB — COMPREHENSIVE METABOLIC PANEL
ALT: 178 U/L — ABNORMAL HIGH (ref 0–44)
AST: 69 U/L — ABNORMAL HIGH (ref 15–41)
Albumin: 2.5 g/dL — ABNORMAL LOW (ref 3.5–5.0)
Alkaline Phosphatase: 106 U/L (ref 38–126)
Anion gap: 13 (ref 5–15)
BUN: 20 mg/dL (ref 8–23)
CO2: 23 mmol/L (ref 22–32)
Calcium: 8.8 mg/dL — ABNORMAL LOW (ref 8.9–10.3)
Chloride: 107 mmol/L (ref 98–111)
Creatinine, Ser: 0.88 mg/dL (ref 0.44–1.00)
GFR calc Af Amer: >60 mL/min (ref >60)
GFR calc non Af Amer: >60 mL/min (ref >60)
Glucose, Bld: 153 mg/dL — ABNORMAL HIGH (ref 70–99)
Potassium: 3.9 mmol/L (ref 3.5–5.1)
Sodium: 143 mmol/L (ref 135–145)
Total Bilirubin: 0.2 mg/dL — ABNORMAL LOW (ref 0.3–1.2)
Total Protein: 5.9 g/dL — ABNORMAL LOW (ref 6.5–8.1)

## 2020-04-13 LAB — CBC WITH DIFFERENTIAL/PLATELET
Abs Immature Granulocytes: 0.87 10*3/uL — ABNORMAL HIGH (ref 0.00–0.07)
Basophils Absolute: 0.1 10*3/uL (ref 0.0–0.1)
Basophils Relative: 1 %
Eosinophils Absolute: 0.1 10*3/uL (ref 0.0–0.5)
Eosinophils Relative: 0 %
HCT: 35.4 % — ABNORMAL LOW (ref 36.0–46.0)
Hemoglobin: 11.5 g/dL — ABNORMAL LOW (ref 12.0–15.0)
Immature Granulocytes: 5 %
Lymphocytes Relative: 5 %
Lymphs Abs: 0.8 10*3/uL (ref 0.7–4.0)
MCH: 29.9 pg (ref 26.0–34.0)
MCHC: 32.5 g/dL (ref 30.0–36.0)
MCV: 91.9 fL (ref 80.0–100.0)
Monocytes Absolute: 0.5 10*3/uL (ref 0.1–1.0)
Monocytes Relative: 3 %
Neutro Abs: 14.5 10*3/uL — ABNORMAL HIGH (ref 1.7–7.7)
Neutrophils Relative %: 86 %
Platelets: 414 10*3/uL — ABNORMAL HIGH (ref 150–400)
RBC: 3.85 MIL/uL — ABNORMAL LOW (ref 3.87–5.11)
RDW: 13.2 % (ref 11.5–15.5)
WBC: 16.8 10*3/uL — ABNORMAL HIGH (ref 4.0–10.5)
nRBC: 0 % (ref 0.0–0.2)

## 2020-04-13 LAB — MAGNESIUM: Magnesium: 2.6 mg/dL — ABNORMAL HIGH (ref 1.7–2.4)

## 2020-04-13 LAB — FERRITIN: Ferritin: 316 ng/mL — ABNORMAL HIGH (ref 11–307)

## 2020-04-13 LAB — BRAIN NATRIURETIC PEPTIDE: B Natriuretic Peptide: 364.1 pg/mL — ABNORMAL HIGH (ref 0.0–100.0)

## 2020-04-13 LAB — D-DIMER, QUANTITATIVE: D-Dimer, Quant: 3.26 ug/mL-FEU — ABNORMAL HIGH (ref 0.00–0.50)

## 2020-04-13 MED ORDER — REMDESIVIR 100 MG IV SOLR
100.0000 mg | INTRAVENOUS | Status: DC
  Filled 2020-04-13: qty 20

## 2020-04-13 MED ORDER — SODIUM CHLORIDE 0.9 % IV SOLN
100.0000 mg | Freq: Every day | INTRAVENOUS | Status: AC
  Administered 2020-04-13 – 2020-04-16 (×4): 100 mg via INTRAVENOUS
  Filled 2020-04-13 (×5): qty 20

## 2020-04-13 NOTE — Progress Notes (Addendum)
Pt off spo2. When placed back on pt is in the high 60s-low 70s.   Pt stated that she got up to the [facility]. She must have pulled the spo2 cord out of the machine.  Pt was on HFNC @11 , PT is now on HFNC @15  and NRB @15 .   RT paged to come assess.   2151 10L NRB and 15 HFNC.

## 2020-04-13 NOTE — Progress Notes (Signed)
PROGRESS NOTE                                                                                                                                                                                                             Patient Demographics:    [name], is a 79 y.o. female, DOB - 1941-02-21, OYD:[id number]  Admit date - 03/28/2020   Admitting Physician Mckinley Jewel, MD  Outpatient Primary MD for the patient is [name], MD  LOS - 4   Chief Complaint  Patient presents with  . Shortness of Breath       Brief Narrative     [name] is a 79 y.o. female with medical history significant of hypertension, migraine headache, GERD, hyperlipidemia presents to emergency department with worsening shortness of breath.  Patient reports shortness of breath, cough, fever, chills, fatigue, generalized weakness, no energy, loss of sense of smell and taste, decreased p.o. intake since 1 week.  Patient tells me that she is fully vaccinated against COVID-19 however she recently exposed to her son who had contracted Covid infection at work.  Patient reports that her cough and fever has improved however due to worsening shortness of breath she came to ER for further evaluation and management.  Denies headache, blurry vision, lightheadedness, dizziness, chest pain, vomiting, abdominal pain, swelling of legs, history of DVT/PE, immobilization, urinary or bowel changes.  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  No history of smoking, alcohol, licit drug use.   Upon arrival to ED: Patient tachypneic, hypoxemic requiring nonrebreather and then placed on 4 L of oxygen via nasal cannula, she tested positive for COVID-19.  She is afebrile with leukocytosis of 15.9, lactic acid: 2.1, CMP shows AST of 91, ALT: 63, D-dimer: 2.36, procalcitonin: 0.72, CRP: 36, chest x-ray shows diffuse increased interstitial  markings throughout both lungs which may reflect edema versus atypical/viral infection.  Patient received Decadron, albuterol, Tylenol in ED.  [facility] consulted for admission for acute hypoxemic respiratory failure secondary to COVID-19 pneumonia.   Subjective:    [name] today she denies any abdominal pain, nausea or vomiting, but reports she feels generally weak, and has poor appetite, she still reports some dyspnea on cough .    Assessment  & Plan :    Principal Problem:  Acute hypoxemic respiratory failure due to COVID-19 [facility]) Active Problems:   Hyperlipidemia   Anxiety and depression   Migraine headache   Essential hypertension   GERD   Elevated liver enzymes    Acute hypoxemic respiratory failure secondary to COVID-19 pneumonia: -Patient presented with tachypnea, oxygen requirement this morning, she remains with significant oxygen requirement, she is on 12 L nasal cannula. -Continue with IV steroids, she is on IV Solu-Medrol. -Started on IV remdesivir, but this has been held due to elevated LFTs -Received Decadron and albuterol breathing treatment and Tylenol in ED. -Was encouraged to use incentive spirometry, flutter valve, -Continue with antitussives -Continue to trend inflammatory markers closely. -treated with IV Rocephin and azithromycin given procalcitonin of 0.7, procalcitonin is trending down. -D-dimers peaked at 4.2 ,on  Lovenox to 0.5 mg/kg every 12 hours. Dopplers negative for DVT, D-dimers trending down, will change Lovenox to prophylaxis dose. -I have discussed with the patient, no history of diverticulitis, tuberculosis or viral hepatitis infection, no active malignancy or immunosuppressive therapy, I have explained risks and benefits, she is started on barsitinib. -Has poor appetite, so she is started on Ensure  SpO2: 97 % O2 Flow Rate (L/min): 15 L/min   COVID-19 Labs  Recent Labs    04/10/20 1526 04/11/20 0251 04/12/20 0109  04/13/20 0300  DDIMER  --  4.28* 2.37* 3.26*  FERRITIN  --  815* 461* 316*  CRP 28.4*  --   --   --     Lab Results  Component Value Date   SARSCOV2NAA POSITIVE (A) 03/21/2020   Elevated liver enzymes:  -Main significantly elevated, but trending down,this is most likely in the setting of hypotension on admission with liver shock, as well possibly due to Covid, Remdesivir has been held, now her ALT is less than 220, she would be resumed on remdesivir. -No acute findings abdominal ultrasound  Abdominal pain -Totally resolved, abdomen exam is benign today, lipase within normal limits, no acute findings on abdominal ultrasound .  Hypertension: -Remains on the lower side, continue to hold losartan  GERD: Continue PPI  Hyperlipidemia: Continue statin  Migraine headache: Continue Imitrex  Chronic back pain: Tramadol daily  Code Status : Full  Family Communication: discussed with the granddaughter Cyril Mourning daily  Disposition Plan  :  Status is: Inpatient  Remains inpatient appropriate because:Hemodynamically unstable and IV treatments appropriate due to intensity of illness or inability to take PO   Dispo: The patient is from: Home              Anticipated d/c is to: Home              Anticipated d/c date is: > 3 days              Patient currently is medically stable to d/c.      Consults  :  none  Procedures  : none  DVT Prophylaxis  :  [place] lovenox  Lab Results  Component Value Date   PLT 414 (H) 04/13/2020     Antibiotics  :    Anti-infectives (From admission, onward)   Start     Dose/Rate Route Frequency Ordered Stop   04/13/20 1100  remdesivir injection 100 mg        100 mg Intravenous Every 24 hours 04/13/20 0957 04/17/20 1059   04/12/20 1430  azithromycin (ZITHROMAX) tablet 500 mg        500 mg Oral Daily 04/12/20 1321     04/10/20 1000  remdesivir  100 mg in sodium chloride 0.9 % 100 mL IVPB  Status:  Discontinued       "Followed by" Linked Group  Details   100 mg 200 mL/hr over 30 Minutes Intravenous Daily 03/31/2020 1551 04/10/20 0800   04/04/2020 2200  hydroxychloroquine (PLAQUENIL) tablet 200 mg        200 mg Oral 2 times daily 03/16/2020 1653     04/06/2020 1600  remdesivir 200 mg in sodium chloride 0.9% 250 mL IVPB       "Followed by" Linked Group Details   200 mg 580 mL/hr over 30 Minutes Intravenous Once 03/17/2020 1551 04/12/2020 2139   03/18/2020 1600  azithromycin (ZITHROMAX) 500 mg in sodium chloride 0.9 % 250 mL IVPB  Status:  Discontinued        500 mg 250 mL/hr over 60 Minutes Intravenous Every 24 hours 04/04/2020 1552 04/12/20 1321   03/19/2020 1600  cefTRIAXone (ROCEPHIN) 1 g in sodium chloride 0.9 % 100 mL IVPB        1 g 200 mL/hr over 30 Minutes Intravenous Every 24 hours 04/13/2020 1552          Objective:   Vitals:   04/12/20 1552 04/12/20 1928 04/13/20 0300 04/13/20 0831  BP: (!) 128/48 (!) 145/75 137/76 (!) 134/52  Pulse: 85 85 82 92  Resp: (!) 22 20 20 18   Temp: 98.6 F (37 C) 98.2 F (36.8 C) 98 F (36.7 C) 98.3 F (36.8 C)  TempSrc: Oral Axillary Axillary   SpO2:  98% 97%   Weight:      Height:        Wt Readings from Last 3 Encounters:  04/10/20 73 kg  10/22/19 73.4 kg  04/16/19 73.9 kg     Intake/Output Summary (Last 24 hours) at 04/13/2020 1128 Last data filed at 04/13/2020 0600 Gross per 24 hour  Intake 2440 ml  Output --  Net 2440 ml     Physical Exam  Awake Alert, Oriented X 3, extremely frail, sitting in recliner.  Symmetrical Chest wall movement, Good air movement bilaterally, CTAB RRR,No Gallops,Rubs or new Murmurs, No Parasternal Heave  Abd Soft, No tenderness. No Cyanosis, Clubbing or edema, No new Rash or bruise       Data Review:    CBC Recent Labs  Lab 04/10/20 0518 04/10/20 1526 04/11/20 0251 04/12/20 0109 04/13/20 0300  WBC 15.4* 19.8* 16.3* 15.4* 16.8*  HGB 13.1 13.3 10.9* 11.3* 11.5*  HCT 39.8 41.1 33.6* 35.0* 35.4*  PLT 364 423* 387 410* 414*  MCV 91.7 92.2  91.1 93.3 91.9  MCH 30.2 29.8 29.5 30.1 29.9  MCHC 32.9 32.4 32.4 32.3 32.5  RDW 13.1 13.1 13.2 13.2 13.2  LYMPHSABS 0.7 0.8 0.9 1.1 0.8  MONOABS 0.2 0.4 0.4 0.5 0.5  EOSABS 0.0 0.3 0.0 0.0 0.1  BASOSABS 0.1 0.1 0.0 0.1 0.1    Chemistries  Recent Labs  Lab 04/10/20 0518 04/10/20 1526 04/11/20 0251 04/12/20 0109 04/13/20 0300  NA 138 142 142 142 143  K 3.7 4.2 3.8 3.6 3.9  CL 99 103 106 107 107  CO2 25 23 22 24 23   GLUCOSE 155* 146* 160* 187* 153*  BUN 30* 37* 40* 33* 20  CREATININE 1.00 0.98 1.04* 1.11* 0.88  CALCIUM 9.1 9.1 8.5* 8.5* 8.8*  MG 2.9*  --  2.8* 2.7* 2.6*  AST 504* 697* 348* 180* 69*  ALT 275* 474* 320* 257* 178*  ALKPHOS 118 134* 104 97 106  BILITOT 0.8 1.4*  0.7 0.4 0.2*   ------------------------------------------------------------------------------------------------------------------ No results for input(s): CHOL, HDL, LDLCALC, TRIG, CHOLHDL, LDLDIRECT in the last 72 hours.  Lab Results  Component Value Date   HGBA1C 5.6 10/22/2019   ------------------------------------------------------------------------------------------------------------------ No results for input(s): TSH, T4TOTAL, T3FREE, THYROIDAB in the last 72 hours.  Invalid input(s): FREET3 ------------------------------------------------------------------------------------------------------------------ Recent Labs    04/12/20 0109 04/13/20 0300  FERRITIN 461* 316*    Coagulation profile No results for input(s): INR, PROTIME in the last 168 hours.  Recent Labs    04/12/20 0109 04/13/20 0300  DDIMER 2.37* 3.26*    Cardiac Enzymes No results for input(s): CKMB, TROPONINI, MYOGLOBIN in the last 168 hours.  Invalid input(s): CK ------------------------------------------------------------------------------------------------------------------    Component Value Date/Time   BNP 364.1 (H) 04/13/2020 0300    Inpatient Medications  Scheduled Meds: . AeroChamber Plus Flo-Vu Large   1 each Other Once  . albuterol  2 puff Inhalation Q6H  . vitamin C  500 mg Oral Daily  . atorvastatin  20 mg Oral Daily  . azithromycin  500 mg Oral Daily  . baricitinib  2 mg Oral Daily  . enoxaparin (LOVENOX) injection  40 mg Subcutaneous Daily  . feeding supplement (ENSURE ENLIVE)  237 mL Oral BID BM  . gabapentin  300 mg Oral BID  . hydroxychloroquine  200 mg Oral BID  . methylPREDNISolone (SOLU-MEDROL) injection  40 mg Intravenous Q8H  . pantoprazole  40 mg Oral Daily  . remdesivir  100 mg Intravenous Q24H  . zinc sulfate  220 mg Oral Daily   Continuous Infusions: . cefTRIAXone (ROCEPHIN)  IV 1 g (04/12/20 1521)   PRN Meds:.acetaminophen, chlorpheniramine-HYDROcodone, guaiFENesin-dextromethorphan, ondansetron **OR** ondansetron (ZOFRAN) IV, traMADol  Micro Results Recent Results (from the past 240 hour(s))  SARS Coronavirus 2 by RT PCR (hospital order, performed in [place] hospital lab) Nasopharyngeal Nasopharyngeal Swab     Status: Abnormal   Collection Time: 03/26/2020 12:38 PM   Specimen: Nasopharyngeal Swab  Result Value Ref Range Status   SARS Coronavirus 2 POSITIVE (A) NEGATIVE Final    Comment: RESULT CALLED TO, READ BACK BY AND VERIFIED WITH: Precious Reel RN 15:30 03/22/2020 ([email or web address]) (NOTE) SARS-CoV-2 target nucleic acids are DETECTED  SARS-CoV-2 RNA is generally detectable in upper respiratory specimens  during the acute phase of infection.  Positive results are indicative  of the presence of the identified virus, but do not rule out bacterial infection or co-infection with other pathogens not detected by the test.  Clinical correlation with patient history and  other diagnostic information is necessary to determine patient infection status.  The expected result is negative.  Fact Sheet for Patients:   StrictlyIdeas.no   Fact Sheet for Healthcare Providers:   BankingDealers.co.za    This test is not yet approved  or cleared by the [place] FDA and  has been authorized for detection and/or diagnosis of SARS-CoV-2 by FDA under an Emergency Use Authorization (EUA).  This EUA will remain in effect (meaning this  test can be used) for the duration of  the COVID-19 declaration under Section 564(b)(1) of the Act, 21 U.S.C. section 360-bbb-3(b)(1), unless the authorization is terminated or revoked sooner.  Performed at [facility], [place] [address] [place], [place] [address]   Blood Culture (routine x 2)     Status: None (Preliminary result)   Collection Time: 04/04/2020  1:02 PM   Specimen: BLOOD  Result Value Ref Range Status   Specimen Description BLOOD LEFT ANTECUBITAL  Final  Special Requests   Final    BOTTLES DRAWN AEROBIC AND ANAEROBIC Blood Culture results may not be optimal due to an inadequate volume of blood received in culture bottles   Culture   Final    NO GROWTH 3 DAYS Performed at [facility], [place] [address] [place], [place] [address]    Report Status PENDING  Incomplete  Blood Culture (routine x 2)     Status: None (Preliminary result)   Collection Time: 04/14/2020  1:07 PM   Specimen: BLOOD  Result Value Ref Range Status   Specimen Description BLOOD RIGHT ANTECUBITAL  Final   Special Requests   Final    BOTTLES DRAWN AEROBIC ONLY Blood Culture results may not be optimal due to an inadequate volume of blood received in culture bottles   Culture   Final    NO GROWTH 3 DAYS Performed at [facility], [place] [address], [place], [place] [address]    Report Status PENDING  Incomplete    Radiology Reports US Abdomen Complete  Result Date: 04/10/2020 CLINICAL DATA:  Epigastric pain EXAM: ABDOMEN ULTRASOUND COMPLETE COMPARISON:  None. FINDINGS: Gallbladder: No gallstones or wall thickening visualized. No sonographic Murphy sign noted by sonographer. Common bile duct: Diameter: 3.3 mm Liver: Liver is echogenic. No focal hepatic abnormality. Portal vein is patent on  color Doppler imaging with normal direction of blood flow towards the liver. IVC: No abnormality visualized. Pancreas: Visualized portion unremarkable. Spleen: Size and appearance within normal limits. Right Kidney: Length: 10 cm. Echogenicity within normal limits. No mass or hydronephrosis visualized. Left Kidney: Length: 10.4 cm. Echogenicity within normal limits. No hydronephrosis. Cyst off the lower pole measuring 3.1 cm. Abdominal aorta: No aneurysm visualized. Other findings: None. IMPRESSION: 1. Negative for gallstones or biliary dilatation 2. Echogenic liver consistent with steatosis and or hepatocellular disease. 3. Simple appearing left renal cyst Electronically Signed   By: Donavan Foil M.D.   On: 04/10/2020 18:12   DG Chest Portable 1 View  Result Date: 04/02/2020 CLINICAL DATA:  Shortness of breath.  COVID-19 exposure EXAM: PORTABLE CHEST 1 VIEW COMPARISON:  01/13/2012 FINDINGS: The heart size and mediastinal contours are within normal limits. Atherosclerotic calcification of the aortic knob. Diffuse increased interstitial markings throughout both lungs. No pleural effusion or pneumothorax. The visualized skeletal structures are unremarkable. IMPRESSION: Diffuse increased interstitial markings throughout both lungs, which may reflect edema versus atypical/viral infection. Electronically Signed   By: Davina Poke D.O.   On: 04/08/2020 13:36   DG Abd Portable 1V  Result Date: 04/10/2020 CLINICAL DATA:  Abdominal pain. EXAM: PORTABLE ABDOMEN - 1 VIEW COMPARISON:  None. FINDINGS: The bowel gas pattern is normal. No radio-opaque calculi or other significant radiographic abnormality are seen. IMPRESSION: Negative. Electronically Signed   By: Marijo Conception M.D.   On: 04/10/2020 15:17   VAS [place] LOWER EXTREMITY VENOUS (DVT)  Result Date: 04/11/2020  Lower Venous DVTStudy Indications: Elevated Ddimer.  Risk Factors: COVID 19 positive. Comparison Study: No prior studies. Performing Technologist:  Oliver Hum RVT  Examination Guidelines: A complete evaluation includes B-mode imaging, spectral Doppler, color Doppler, and power Doppler as needed of all accessible portions of each vessel. Bilateral testing is considered an integral part of a complete examination. Limited examinations for reoccurring indications may be performed as noted. The reflux portion of the exam is performed with the patient in reverse Trendelenburg.  +---------+---------------+---------+-----------+----------+--------------+ RIGHT    CompressibilityPhasicitySpontaneityPropertiesThrombus Aging +---------+---------------+---------+-----------+----------+--------------+ CFV      Full  Yes      Yes                                 +---------+---------------+---------+-----------+----------+--------------+ SFJ      Full                                                        +---------+---------------+---------+-----------+----------+--------------+ FV Prox  Full                                                        +---------+---------------+---------+-----------+----------+--------------+ FV Mid   Full                                                        +---------+---------------+---------+-----------+----------+--------------+ FV DistalFull                                                        +---------+---------------+---------+-----------+----------+--------------+ PFV      Full                                                        +---------+---------------+---------+-----------+----------+--------------+ POP      Full           Yes      Yes                                 +---------+---------------+---------+-----------+----------+--------------+ PTV      Full                                                        +---------+---------------+---------+-----------+----------+--------------+ PERO     Full                                                         +---------+---------------+---------+-----------+----------+--------------+   +---------+---------------+---------+-----------+----------+--------------+ LEFT     CompressibilityPhasicitySpontaneityPropertiesThrombus Aging +---------+---------------+---------+-----------+----------+--------------+ CFV      Full           Yes      Yes                                 +---------+---------------+---------+-----------+----------+--------------+ SFJ  Full                                                        +---------+---------------+---------+-----------+----------+--------------+ FV Prox  Full                                                        +---------+---------------+---------+-----------+----------+--------------+ FV Mid   Full                                                        +---------+---------------+---------+-----------+----------+--------------+ FV DistalFull                                                        +---------+---------------+---------+-----------+----------+--------------+ PFV      Full                                                        +---------+---------------+---------+-----------+----------+--------------+ POP      Full           Yes      Yes                                 +---------+---------------+---------+-----------+----------+--------------+ PTV      Full                                                        +---------+---------------+---------+-----------+----------+--------------+ PERO     Full                                                        +---------+---------------+---------+-----------+----------+--------------+     Summary: RIGHT: - There is no evidence of deep vein thrombosis in the lower extremity.  - No cystic structure found in the popliteal fossa.  LEFT: - There is no evidence of deep vein thrombosis in the lower extremity.  - No cystic structure found in the popliteal  fossa.  *See table(s) above for measurements and observations. Electronically signed by Servando Snare MD on 04/11/2020 at 4:56:04 PM.    Final      Phillips Climes M.D on 04/13/2020 at 11:28 AM    [facility] -  Office  [phone]

## 2020-04-14 LAB — COMPREHENSIVE METABOLIC PANEL
ALT: 137 U/L — ABNORMAL HIGH (ref 0–44)
AST: 46 U/L — ABNORMAL HIGH (ref 15–41)
Albumin: 2.7 g/dL — ABNORMAL LOW (ref 3.5–5.0)
Alkaline Phosphatase: 117 U/L (ref 38–126)
Anion gap: 13 (ref 5–15)
BUN: 20 mg/dL (ref 8–23)
CO2: 25 mmol/L (ref 22–32)
Calcium: 8.9 mg/dL (ref 8.9–10.3)
Chloride: 102 mmol/L (ref 98–111)
Creatinine, Ser: 0.85 mg/dL (ref 0.44–1.00)
GFR calc Af Amer: >60 mL/min (ref >60)
GFR calc non Af Amer: >60 mL/min (ref >60)
Glucose, Bld: 139 mg/dL — ABNORMAL HIGH (ref 70–99)
Potassium: 4.3 mmol/L (ref 3.5–5.1)
Sodium: 140 mmol/L (ref 135–145)
Total Bilirubin: 0.6 mg/dL (ref 0.3–1.2)
Total Protein: 6.3 g/dL — ABNORMAL LOW (ref 6.5–8.1)

## 2020-04-14 LAB — PROCALCITONIN: Procalcitonin: <0.1 ng/mL

## 2020-04-14 LAB — CBC WITH DIFFERENTIAL/PLATELET
Abs Immature Granulocytes: 1.6 10*3/uL — ABNORMAL HIGH (ref 0.00–0.07)
Basophils Absolute: 0.1 10*3/uL (ref 0.0–0.1)
Basophils Relative: 1 %
Eosinophils Absolute: 0 10*3/uL (ref 0.0–0.5)
Eosinophils Relative: 0 %
HCT: 38.9 % (ref 36.0–46.0)
Hemoglobin: 12.5 g/dL (ref 12.0–15.0)
Immature Granulocytes: 8 %
Lymphocytes Relative: 3 %
Lymphs Abs: 0.6 10*3/uL — ABNORMAL LOW (ref 0.7–4.0)
MCH: 29.1 pg (ref 26.0–34.0)
MCHC: 32.1 g/dL (ref 30.0–36.0)
MCV: 90.7 fL (ref 80.0–100.0)
Monocytes Absolute: 0.5 10*3/uL (ref 0.1–1.0)
Monocytes Relative: 3 %
Neutro Abs: 16.5 10*3/uL — ABNORMAL HIGH (ref 1.7–7.7)
Neutrophils Relative %: 85 %
Platelets: 361 10*3/uL (ref 150–400)
RBC: 4.29 MIL/uL (ref 3.87–5.11)
RDW: 12.9 % (ref 11.5–15.5)
WBC: 19.4 10*3/uL — ABNORMAL HIGH (ref 4.0–10.5)
nRBC: 0 % (ref 0.0–0.2)

## 2020-04-14 LAB — CULTURE, BLOOD (ROUTINE X 2)
Culture: NO GROWTH
Culture: NO GROWTH

## 2020-04-14 LAB — D-DIMER, QUANTITATIVE: D-Dimer, Quant: >20 ug/mL-FEU — ABNORMAL HIGH (ref 0.00–0.50)

## 2020-04-14 LAB — MAGNESIUM: Magnesium: 2.3 mg/dL (ref 1.7–2.4)

## 2020-04-14 LAB — FERRITIN: Ferritin: 277 ng/mL (ref 11–307)

## 2020-04-14 MED ORDER — ENOXAPARIN SODIUM 80 MG/0.8ML ~~LOC~~ SOLN
70.0000 mg | Freq: Two times a day (BID) | SUBCUTANEOUS | Status: DC
  Administered 2020-04-14 – 2020-04-20 (×13): 70 mg via SUBCUTANEOUS
  Filled 2020-04-14 (×13): qty 0.8

## 2020-04-14 NOTE — Progress Notes (Signed)
Physical Therapy Treatment Patient Details Name: [name] MRN: [id number] DOB: 1941-04-30 Today's Date: 04/14/2020    History of Present Illness 79 y.o. female with medical history significant of hypertension, migraine headache, GERD, hyperlipidemia presented to emergency department with worsening shortness of breath. Pt admitted for acute hypoxemic resp failure due to COVID.    PT Comments    Patient received in bed, asleep but easily woken and willing to participate in therapy. Able to perform on an S-min guard level with IV pole however much weaker and more fatigued today, also on a higher amount of supplemental O2 than at eval and much more easily fatigued. Unable to gait train more than 23f with IV pole. Desat to 78% on 15LPM, took extended time to recover back to 88-90% on this amount of O2. She reports significant concern about her current fatigue and weakness and prefers that she get follow-up therapies if she can- especially since she is the primary caregiver for her husband with dementia who will be returning home from a SNF himself in the next couple of weeks. Left up in recliner with all needs met this afternoon. PT recommendations updated as appropriate.     Follow Up Recommendations  SNF;Other (comment) (attempt to progress to HHPT)     Equipment Recommendations  None recommended by PT    Recommendations for Other Services       Precautions / Restrictions Precautions Precautions: Fall Restrictions Weight Bearing Restrictions: No    Mobility  Bed Mobility Overal bed mobility: Needs Assistance Bed Mobility: Supine to Sit     Supine to sit: HOB elevated;Supervision     General bed mobility comments: +rail, supervision for safety  Transfers Overall transfer level: Needs assistance Equipment used: None Transfers: Sit to/from Stand Sit to Stand: Min guard         General transfer comment: min guard for safety, increased time to stabilize balance with  initial stance OOB  Ambulation/Gait Ambulation/Gait assistance: Min guard Gait Distance (Feet): 15 Feet Assistive device: None Gait Pattern/deviations: Step-through pattern;WFL(Within Functional Limits) Gait velocity: decreased   General Gait Details: used IV pole, mildly unsteady and slow gait   Stairs             Wheelchair Mobility    Modified Rankin (Stroke Patients Only)       Balance Overall balance assessment: Mild deficits observed, not formally tested                                          Cognition Arousal/Alertness: Awake/alert Behavior During Therapy: WFL for tasks assessed/performed Overall Cognitive Status: Within Functional Limits for tasks assessed                                        Exercises      General Comments General comments (skin integrity, edema, etc.): desat down to as low as 78% on 15LPM with moblity; extended time to recover to 88-90% on 15LPM. 3/4 DOE.      Pertinent Vitals/Pain Pain Assessment: No/denies pain    Home Living                      Prior Function            PT Goals (current goals can now  be found in the care plan section) Acute Rehab PT Goals Patient Stated Goal: home PT Goal Formulation: With patient Time For Goal Achievement: 04/25/20 Potential to Achieve Goals: Good Progress towards PT goals: Progressing toward goals    Frequency    Min 3X/week      PT Plan Discharge plan needs to be updated    Co-evaluation              AM-PAC PT "6 Clicks" Mobility   Outcome Measure  Help needed turning from your back to your side while in a flat bed without using bedrails?: None Help needed moving from lying on your back to sitting on the side of a flat bed without using bedrails?: None Help needed moving to and from a bed to a chair (including a wheelchair)?: A Little Help needed standing up from a chair using your arms (e.g., wheelchair or bedside  chair)?: A Little Help needed to walk in hospital room?: A Little Help needed climbing 3-5 steps with a railing? : A Lot 6 Click Score: 19    End of Session Equipment Utilized During Treatment: Oxygen Activity Tolerance: Patient tolerated treatment well Patient left: in chair;with call bell/phone within reach Nurse Communication: Mobility status PT Visit Diagnosis: Unsteadiness on feet (R26.81);Difficulty in walking, not elsewhere classified (R26.2);Muscle weakness (generalized) (M62.81)     Time: 3794-3276 PT Time Calculation (min) (ACUTE ONLY): 27 min  Charges:  $Gait Training: 8-22 mins $Therapeutic Activity: 8-22 mins                     Windell Norfolk, DPT, PN1   Supplemental Physical Therapist [place]    Pager [phone] Acute Rehab Office [phone]

## 2020-04-14 NOTE — Plan of Care (Signed)

## 2020-04-14 NOTE — Progress Notes (Signed)
PROGRESS NOTE                                                                                                                                                                                                             Patient Demographics:    [name], is a 79 y.o. female, DOB - 01-04-1941, SEG:[id number]  Admit date - 04/02/2020   Admitting Physician Mckinley Jewel, MD  Outpatient Primary MD for the patient is [name], MD  LOS - 5   Chief Complaint  Patient presents with  . Shortness of Breath       Brief Narrative     [name] is a 79 y.o. female with medical history significant of hypertension, migraine headache, GERD, hyperlipidemia presents to emergency department with worsening shortness of breath.  Patient reports shortness of breath, cough, fever, chills, fatigue, generalized weakness, no energy, loss of sense of smell and taste, decreased p.o. intake since 1 week.  Patient tells me that she is fully vaccinated against COVID-19 however she recently exposed to her son who had contracted Covid infection at work.  Patient reports that her cough and fever has improved however due to worsening shortness of breath she came to ER for further evaluation and management.  Denies headache, blurry vision, lightheadedness, dizziness, chest pain, vomiting, abdominal pain, swelling of legs, history of DVT/PE, immobilization, urinary or bowel changes.  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  No history of smoking, alcohol, licit drug use.   Upon arrival to ED: Patient tachypneic, hypoxemic requiring nonrebreather and then placed on 4 L of oxygen via nasal cannula, she tested positive for COVID-19.  She is afebrile with leukocytosis of 15.9, lactic acid: 2.1, CMP shows AST of 91, ALT: 63, D-dimer: 2.36, procalcitonin: 0.72, CRP: 36, chest x-ray shows diffuse increased interstitial  markings throughout both lungs which may reflect edema versus atypical/viral infection.  Patient received Decadron, albuterol, Tylenol in ED.  [facility] hospital[facility] consulted for admission for acute hypoxemic respiratory failure secondary to COVID-19 pneumonia.   Subjective:    [name] today denies any abdominal pain, nausea or vomiting, reports appetite is improving, reports overall she still feeling weak, still reports dyspnea and  cough .   Assessment  & Plan :    Principal Problem:   Acute hypoxemic  respiratory failure due to COVID-19 [facility]) Active Problems:   Hyperlipidemia   Anxiety and depression   Migraine headache   Essential hypertension   GERD   Elevated liver enzymes    Acute hypoxemic respiratory failure secondary to COVID-19 pneumonia: -Patient presented with tachypnea, oxygen requirement this morning, she remains with significant oxygen requirement, she is on 15 L nasal cannula. -Continue with IV steroids, she is on IV Solu-Medrol. -Continue with baricitinib -Resumed back on remdesivir as LFTs trending down. -Received Decadron and albuterol breathing treatment and Tylenol in ED. -Was encouraged to use incentive spirometry, flutter valve, -Continue with antitussives -Continue to trend inflammatory markers closely. -treated with IV Rocephin and azithromycin given procalcitonin of 0.7, procalcitonin is trending down. -D Dimer is again significantly elevated > 20, so she is back on full dose Lovenox .  Dopplers were negative for DVT this admission..  SpO2: 90 % O2 Flow Rate (L/min): 15 L/min (10NRB)   COVID-19 Labs  Recent Labs    04/12/20 0109 04/13/20 0300 04/14/20 0310  DDIMER 2.37* 3.26* >20.00*  FERRITIN 461* 316* 277    Lab Results  Component Value Date   SARSCOV2NAA POSITIVE (A) 03/19/2020   Elevated liver enzymes:  -Main significantly elevated, but trending down,this is most likely in the setting of hypotension on admission with liver shock, as  well possibly due to Covid, Remdesivir has been held, now her ALT is less than 220, she would be resumed on remdesivir. -No acute findings abdominal ultrasound  Abdominal pain -Totally resolved, abdomen exam is benign today, lipase within normal limits, no acute findings on abdominal ultrasound .  Hypertension: -Remains on the lower side, continue to hold losartan  GERD: Continue PPI  Hyperlipidemia: Continue statin  Migraine headache: Continue Imitrex  Chronic back pain: Tramadol daily  Code Status : Full  Family Communication: discussed with the granddaughter Cyril Mourning daily, she was left a Advertising account executive today.  Disposition Plan  :  Status is: Inpatient  Remains inpatient appropriate because:Hemodynamically unstable and IV treatments appropriate due to intensity of illness or inability to take PO   Dispo: The patient is from: Home              Anticipated d/c is to: Home              Anticipated d/c date is: > 3 days              Patient currently is medically stable to d/c.      Consults  :  none  Procedures  : none  DVT Prophylaxis  :  [place] lovenox  Lab Results  Component Value Date   PLT 361 04/14/2020     Antibiotics  :    Anti-infectives (From admission, onward)   Start     Dose/Rate Route Frequency Ordered Stop   04/13/20 1200  remdesivir 100 mg in sodium chloride 0.9 % 100 mL IVPB        100 mg 200 mL/hr over 30 Minutes Intravenous Daily 04/13/20 1150 04/17/20 0959   04/13/20 1100  remdesivir injection 100 mg  Status:  Discontinued        100 mg Intravenous Every 24 hours 04/13/20 0957 04/13/20 1153   04/12/20 1430  azithromycin (ZITHROMAX) tablet 500 mg        500 mg Oral Daily 04/12/20 1321 04/13/20 1218   04/10/20 1000  remdesivir 100 mg in sodium chloride 0.9 % 100 mL IVPB  Status:  Discontinued       "  Followed by" Linked Group Details   100 mg 200 mL/hr over 30 Minutes Intravenous Daily 03/28/2020 1551 04/10/20 0800   03/18/2020 2200   hydroxychloroquine (PLAQUENIL) tablet 200 mg        200 mg Oral 2 times daily 04/08/2020 1653     04/10/2020 1600  remdesivir 200 mg in sodium chloride 0.9% 250 mL IVPB       "Followed by" Linked Group Details   200 mg 580 mL/hr over 30 Minutes Intravenous Once 03/20/2020 1551 04/03/2020 2139   04/02/2020 1600  azithromycin (ZITHROMAX) 500 mg in sodium chloride 0.9 % 250 mL IVPB  Status:  Discontinued        500 mg 250 mL/hr over 60 Minutes Intravenous Every 24 hours 04/04/2020 1552 04/12/20 1321   04/08/2020 1600  cefTRIAXone (ROCEPHIN) 1 g in sodium chloride 0.9 % 100 mL IVPB        1 g 200 mL/hr over 30 Minutes Intravenous Every 24 hours 04/15/2020 1552 04/13/20 1641        Objective:   Vitals:   04/13/20 2315 04/14/20 0223 04/14/20 0822 04/14/20 1216  BP:   (!) 145/60 (!) 140/56  Pulse:   97 91  Resp:   (!) 22   Temp: 98.3 F (36.8 C) 98.3 F (36.8 C) 98 F (36.7 C) 98 F (36.7 C)  TempSrc: Axillary Axillary Oral Oral  SpO2:   90%   Weight:      Height:        Wt Readings from Last 3 Encounters:  04/10/20 73 kg  10/22/19 73.4 kg  04/16/19 73.9 kg     Intake/Output Summary (Last 24 hours) at 04/14/2020 1240 Last data filed at 04/14/2020 0520 Gross per 24 hour  Intake 371.33 ml  Output 301 ml  Net 70.33 ml     Physical Exam  Awake Alert, Oriented X 3, sitting in recliner  symmetrical Chest wall movement, Good air movement bilaterally, CTAB RRR,No Gallops,Rubs or new Murmurs, No Parasternal Heave +ve B.Sounds, Abd Soft. No Cyanosis, Clubbing or edema, No new Rash or bruise         Data Review:    CBC Recent Labs  Lab 04/10/20 1526 04/11/20 0251 04/12/20 0109 04/13/20 0300 04/14/20 0310  WBC 19.8* 16.3* 15.4* 16.8* 19.4*  HGB 13.3 10.9* 11.3* 11.5* 12.5  HCT 41.1 33.6* 35.0* 35.4* 38.9  PLT 423* 387 410* 414* 361  MCV 92.2 91.1 93.3 91.9 90.7  MCH 29.8 29.5 30.1 29.9 29.1  MCHC 32.4 32.4 32.3 32.5 32.1  RDW 13.1 13.2 13.2 13.2 12.9  LYMPHSABS 0.8 0.9 1.1  0.8 0.6*  MONOABS 0.4 0.4 0.5 0.5 0.5  EOSABS 0.3 0.0 0.0 0.1 0.0  BASOSABS 0.1 0.0 0.1 0.1 0.1    Chemistries  Recent Labs  Lab 04/10/20 0518 04/10/20 0518 04/10/20 1526 04/11/20 0251 04/12/20 0109 04/13/20 0300 04/14/20 0310  NA 138   < > 142 142 142 143 140  K 3.7   < > 4.2 3.8 3.6 3.9 4.3  CL 99   < > 103 106 107 107 102  CO2 25   < > 23 22 24 23 25   GLUCOSE 155*   < > 146* 160* 187* 153* 139*  BUN 30*   < > 37* 40* 33* 20 20  CREATININE 1.00   < > 0.98 1.04* 1.11* 0.88 0.85  CALCIUM 9.1   < > 9.1 8.5* 8.5* 8.8* 8.9  MG 2.9*  --   --  2.8* 2.7*  2.6* 2.3  AST 504*   < > 697* 348* 180* 69* 46*  ALT 275*   < > 474* 320* 257* 178* 137*  ALKPHOS 118   < > 134* 104 97 106 117  BILITOT 0.8   < > 1.4* 0.7 0.4 0.2* 0.6   < > = values in this interval not displayed.   ------------------------------------------------------------------------------------------------------------------ No results for input(s): CHOL, HDL, LDLCALC, TRIG, CHOLHDL, LDLDIRECT in the last 72 hours.  Lab Results  Component Value Date   HGBA1C 5.6 10/22/2019   ------------------------------------------------------------------------------------------------------------------ No results for input(s): TSH, T4TOTAL, T3FREE, THYROIDAB in the last 72 hours.  Invalid input(s): FREET3 ------------------------------------------------------------------------------------------------------------------ Recent Labs    04/13/20 0300 04/14/20 0310  FERRITIN 316* 277    Coagulation profile No results for input(s): INR, PROTIME in the last 168 hours.  Recent Labs    04/13/20 0300 04/14/20 0310  DDIMER 3.26* >20.00*    Cardiac Enzymes No results for input(s): CKMB, TROPONINI, MYOGLOBIN in the last 168 hours.  Invalid input(s): CK ------------------------------------------------------------------------------------------------------------------    Component Value Date/Time   BNP 364.1 (H) 04/13/2020 0300     Inpatient Medications  Scheduled Meds: . AeroChamber Plus Flo-Vu Large  1 each Other Once  . albuterol  2 puff Inhalation Q6H  . vitamin C  500 mg Oral Daily  . atorvastatin  20 mg Oral Daily  . baricitinib  2 mg Oral Daily  . enoxaparin (LOVENOX) injection  70 mg Subcutaneous Q12H  . feeding supplement (ENSURE ENLIVE)  237 mL Oral BID BM  . gabapentin  300 mg Oral BID  . hydroxychloroquine  200 mg Oral BID  . methylPREDNISolone (SOLU-MEDROL) injection  40 mg Intravenous Q8H  . pantoprazole  40 mg Oral Daily  . zinc sulfate  220 mg Oral Daily   Continuous Infusions: . remdesivir 100 mg in NS 100 mL 100 mg (04/14/20 0900)   PRN Meds:.acetaminophen, chlorpheniramine-HYDROcodone, guaiFENesin-dextromethorphan, ondansetron **OR** ondansetron (ZOFRAN) IV, traMADol  Micro Results Recent Results (from the past 240 hour(s))  SARS Coronavirus 2 by RT PCR (hospital order, performed in [facility] hospital lab) Nasopharyngeal Nasopharyngeal Swab     Status: Abnormal   Collection Time: 03/22/2020 12:38 PM   Specimen: Nasopharyngeal Swab  Result Value Ref Range Status   SARS Coronavirus 2 POSITIVE (A) NEGATIVE Final    Comment: RESULT CALLED TO, READ BACK BY AND VERIFIED WITH: Precious Reel RN 15:30 04/08/2020 ([email or web address]) (NOTE) SARS-CoV-2 target nucleic acids are DETECTED  SARS-CoV-2 RNA is generally detectable in upper respiratory specimens  during the acute phase of infection.  Positive results are indicative  of the presence of the identified virus, but do not rule out bacterial infection or co-infection with other pathogens not detected by the test.  Clinical correlation with patient history and  other diagnostic information is necessary to determine patient infection status.  The expected result is negative.  Fact Sheet for Patients:   StrictlyIdeas.no   Fact Sheet for Healthcare Providers:   BankingDealers.co.za    This test is not yet  approved or cleared by the [place] FDA and  has been authorized for detection and/or diagnosis of SARS-CoV-2 by FDA under an Emergency Use Authorization (EUA).  This EUA will remain in effect (meaning this  test can be used) for the duration of  the COVID-19 declaration under Section 564(b)(1) of the Act, 21 U.S.C. section 360-bbb-3(b)(1), unless the authorization is terminated or revoked sooner.  Performed at [facility], [place] [address], [place], [place]  [address]   Blood Culture (routine x 2)     Status: None   Collection Time: 03/17/2020  1:02 PM   Specimen: BLOOD  Result Value Ref Range Status   Specimen Description BLOOD LEFT ANTECUBITAL  Final   Special Requests   Final    BOTTLES DRAWN AEROBIC AND ANAEROBIC Blood Culture results may not be optimal due to an inadequate volume of blood received in culture bottles   Culture   Final    NO GROWTH 5 DAYS Performed at [facility], [place] [address], [place], [place] [address]    Report Status 04/14/2020 FINAL  Final  Blood Culture (routine x 2)     Status: None   Collection Time: 04/02/2020  1:07 PM   Specimen: BLOOD  Result Value Ref Range Status   Specimen Description BLOOD RIGHT ANTECUBITAL  Final   Special Requests   Final    BOTTLES DRAWN AEROBIC ONLY Blood Culture results may not be optimal due to an inadequate volume of blood received in culture bottles   Culture   Final    NO GROWTH 5 DAYS Performed at [facility], [place] [address] [place], [place] [address]    Report Status 04/14/2020 FINAL  Final    Radiology Reports US Abdomen Complete  Result Date: 04/10/2020 CLINICAL DATA:  Epigastric pain EXAM: ABDOMEN ULTRASOUND COMPLETE COMPARISON:  None. FINDINGS: Gallbladder: No gallstones or wall thickening visualized. No sonographic Murphy sign noted by sonographer. Common bile duct: Diameter: 3.3 mm Liver: Liver is echogenic. No focal hepatic abnormality. Portal vein is patent on color Doppler imaging  with normal direction of blood flow towards the liver. IVC: No abnormality visualized. Pancreas: Visualized portion unremarkable. Spleen: Size and appearance within normal limits. Right Kidney: Length: 10 cm. Echogenicity within normal limits. No mass or hydronephrosis visualized. Left Kidney: Length: 10.4 cm. Echogenicity within normal limits. No hydronephrosis. Cyst off the lower pole measuring 3.1 cm. Abdominal aorta: No aneurysm visualized. Other findings: None. IMPRESSION: 1. Negative for gallstones or biliary dilatation 2. Echogenic liver consistent with steatosis and or hepatocellular disease. 3. Simple appearing left renal cyst Electronically Signed   By: Donavan Foil M.D.   On: 04/10/2020 18:12   DG Chest Portable 1 View  Result Date: 04/11/2020 CLINICAL DATA:  Shortness of breath.  COVID-19 exposure EXAM: PORTABLE CHEST 1 VIEW COMPARISON:  01/13/2012 FINDINGS: The heart size and mediastinal contours are within normal limits. Atherosclerotic calcification of the aortic knob. Diffuse increased interstitial markings throughout both lungs. No pleural effusion or pneumothorax. The visualized skeletal structures are unremarkable. IMPRESSION: Diffuse increased interstitial markings throughout both lungs, which may reflect edema versus atypical/viral infection. Electronically Signed   By: Davina Poke D.O.   On: 03/16/2020 13:36   DG Abd Portable 1V  Result Date: 04/10/2020 CLINICAL DATA:  Abdominal pain. EXAM: PORTABLE ABDOMEN - 1 VIEW COMPARISON:  None. FINDINGS: The bowel gas pattern is normal. No radio-opaque calculi or other significant radiographic abnormality are seen. IMPRESSION: Negative. Electronically Signed   By: Marijo Conception M.D.   On: 04/10/2020 15:17   VAS [place] LOWER EXTREMITY VENOUS (DVT)  Result Date: 04/11/2020  Lower Venous DVTStudy Indications: Elevated Ddimer.  Risk Factors: COVID 19 positive. Comparison Study: No prior studies. Performing Technologist: Oliver Hum RVT   Examination Guidelines: A complete evaluation includes B-mode imaging, spectral Doppler, color Doppler, and power Doppler as needed of all accessible portions of each vessel. Bilateral testing is considered an integral part of a  complete examination. Limited examinations for reoccurring indications may be performed as noted. The reflux portion of the exam is performed with the patient in reverse Trendelenburg.  +---------+---------------+---------+-----------+----------+--------------+ RIGHT    CompressibilityPhasicitySpontaneityPropertiesThrombus Aging +---------+---------------+---------+-----------+----------+--------------+ CFV      Full           Yes      Yes                                 +---------+---------------+---------+-----------+----------+--------------+ SFJ      Full                                                        +---------+---------------+---------+-----------+----------+--------------+ FV Prox  Full                                                        +---------+---------------+---------+-----------+----------+--------------+ FV Mid   Full                                                        +---------+---------------+---------+-----------+----------+--------------+ FV DistalFull                                                        +---------+---------------+---------+-----------+----------+--------------+ PFV      Full                                                        +---------+---------------+---------+-----------+----------+--------------+ POP      Full           Yes      Yes                                 +---------+---------------+---------+-----------+----------+--------------+ PTV      Full                                                        +---------+---------------+---------+-----------+----------+--------------+ PERO     Full                                                         +---------+---------------+---------+-----------+----------+--------------+   +---------+---------------+---------+-----------+----------+--------------+ LEFT     CompressibilityPhasicitySpontaneityPropertiesThrombus Aging +---------+---------------+---------+-----------+----------+--------------+ CFV      Full  Yes      Yes                                 +---------+---------------+---------+-----------+----------+--------------+ SFJ      Full                                                        +---------+---------------+---------+-----------+----------+--------------+ FV Prox  Full                                                        +---------+---------------+---------+-----------+----------+--------------+ FV Mid   Full                                                        +---------+---------------+---------+-----------+----------+--------------+ FV DistalFull                                                        +---------+---------------+---------+-----------+----------+--------------+ PFV      Full                                                        +---------+---------------+---------+-----------+----------+--------------+ POP      Full           Yes      Yes                                 +---------+---------------+---------+-----------+----------+--------------+ PTV      Full                                                        +---------+---------------+---------+-----------+----------+--------------+ PERO     Full                                                        +---------+---------------+---------+-----------+----------+--------------+     Summary: RIGHT: - There is no evidence of deep vein thrombosis in the lower extremity.  - No cystic structure found in the popliteal fossa.  LEFT: - There is no evidence of deep vein thrombosis in the lower extremity.  - No cystic structure found in the popliteal fossa.   *See table(s) above for measurements and observations. Electronically signed by Servando Snare MD on 04/11/2020 at 4:56:04  PM.    Final      Phillips Climes M.D on 04/14/2020 at 12:40 PM    [facility] -  Office  [phone]

## 2020-04-15 ENCOUNTER — Inpatient Hospital Stay (HOSPITAL_COMMUNITY): Payer: Medicare Other

## 2020-04-15 DIAGNOSIS — R7989 Other specified abnormal findings of blood chemistry: Secondary | ICD-10-CM

## 2020-04-15 LAB — CBC
HCT: 36.1 % (ref 36.0–46.0)
Hemoglobin: 11.6 g/dL — ABNORMAL LOW (ref 12.0–15.0)
MCH: 29.4 pg (ref 26.0–34.0)
MCHC: 32.1 g/dL (ref 30.0–36.0)
MCV: 91.4 fL (ref 80.0–100.0)
Platelets: 395 10*3/uL (ref 150–400)
RBC: 3.95 MIL/uL (ref 3.87–5.11)
RDW: 13 % (ref 11.5–15.5)
WBC: 20.3 10*3/uL — ABNORMAL HIGH (ref 4.0–10.5)
nRBC: 0 % (ref 0.0–0.2)

## 2020-04-15 LAB — COMPREHENSIVE METABOLIC PANEL
ALT: 89 U/L — ABNORMAL HIGH (ref 0–44)
AST: 31 U/L (ref 15–41)
Albumin: 2.4 g/dL — ABNORMAL LOW (ref 3.5–5.0)
Alkaline Phosphatase: 95 U/L (ref 38–126)
Anion gap: 13 (ref 5–15)
BUN: 25 mg/dL — ABNORMAL HIGH (ref 8–23)
CO2: 26 mmol/L (ref 22–32)
Calcium: 8.7 mg/dL — ABNORMAL LOW (ref 8.9–10.3)
Chloride: 101 mmol/L (ref 98–111)
Creatinine, Ser: 0.95 mg/dL (ref 0.44–1.00)
GFR calc Af Amer: >60 mL/min (ref >60)
GFR calc non Af Amer: 57 mL/min — ABNORMAL LOW (ref >60)
Glucose, Bld: 154 mg/dL — ABNORMAL HIGH (ref 70–99)
Potassium: 4.1 mmol/L (ref 3.5–5.1)
Sodium: 140 mmol/L (ref 135–145)
Total Bilirubin: 0.8 mg/dL (ref 0.3–1.2)
Total Protein: 5.9 g/dL — ABNORMAL LOW (ref 6.5–8.1)

## 2020-04-15 LAB — D-DIMER, QUANTITATIVE: D-Dimer, Quant: >20 ug/mL-FEU — ABNORMAL HIGH (ref 0.00–0.50)

## 2020-04-15 LAB — PROCALCITONIN: Procalcitonin: 0.12 ng/mL

## 2020-04-15 LAB — PHOSPHORUS: Phosphorus: 4.3 mg/dL (ref 2.5–4.6)

## 2020-04-15 LAB — C-REACTIVE PROTEIN: CRP: 6.5 mg/dL — ABNORMAL HIGH (ref <1.0)

## 2020-04-15 LAB — GLUCOSE, CAPILLARY: Glucose-Capillary: 145 mg/dL — ABNORMAL HIGH (ref 70–99)

## 2020-04-15 MED ORDER — IOHEXOL 350 MG/ML SOLN
75.0000 mL | Freq: Once | INTRAVENOUS | Status: AC | PRN
  Administered 2020-04-15: 75 mL via INTRAVENOUS

## 2020-04-15 NOTE — Progress Notes (Signed)
Lower extremity venous has been completed.   Preliminary results in CV Proc.   [name] 04/15/2020 10:46 AM

## 2020-04-15 NOTE — Progress Notes (Signed)
Physical Therapy Treatment Patient Details Name: [name] MRN: [id number] DOB: 1941/03/04 Today's Date: 04/15/2020    History of Present Illness 79 y.o. female with medical history significant of hypertension, migraine headache, GERD, hyperlipidemia presented to emergency department with worsening shortness of breath. Pt admitted for acute hypoxemic resp failure due to COVID.    PT Comments    Patient received in chair, very fatigued and with significant increase in O2 requirements but willing to attempt therapy. Focused mainly on functional exercises including ankle pumps, SAQs, heel slides, bicep curls, and overhead press with significant fatigue noted. Able to stand with min guard/RW and performed heel raises but fully spent after this task. VSS on 30LPM (15LPM per HFNC, 15LPM per NRB mask) but very SOB. SPO2 actually improved from 92-93% to 98-100% with activity. Left up in recliner with all needs met this morning.     Follow Up Recommendations  SNF;Other (comment) (attempt to progress back to HHPT level)     Equipment Recommendations  None recommended by PT    Recommendations for Other Services       Precautions / Restrictions Precautions Precautions: Fall Restrictions Weight Bearing Restrictions: No    Mobility  Bed Mobility               General bed mobility comments: OOB in chair  Transfers Overall transfer level: Needs assistance Equipment used: Rolling walker (2 wheeled) Transfers: Sit to/from Stand Sit to Stand: Min guard         General transfer comment: min guard for safety, increased time to stabilize balance with initial stance OOB, cues for hand placement  Ambulation/Gait             General Gait Details: deferred- fatigue   Stairs             Wheelchair Mobility    Modified Rankin (Stroke Patients Only)       Balance Overall balance assessment: Mild deficits observed, not formally tested                                           Cognition Arousal/Alertness: Awake/alert Behavior During Therapy: WFL for tasks assessed/performed Overall Cognitive Status: Within Functional Limits for tasks assessed                                 General Comments: very fatigued today but cooperative and pleasant      Exercises      General Comments General comments (skin integrity, edema, etc.): VSS on 15LPM per HFNC and 15LPM per NRB (30LPM total)      Pertinent Vitals/Pain Pain Assessment: No/denies pain    Home Living                      Prior Function            PT Goals (current goals can now be found in the care plan section) Acute Rehab PT Goals Patient Stated Goal: home PT Goal Formulation: With patient Time For Goal Achievement: 04/25/20 Potential to Achieve Goals: Good Progress towards PT goals: Progressing toward goals    Frequency    Min 3X/week      PT Plan Current plan remains appropriate    Co-evaluation  AM-PAC PT "6 Clicks" Mobility   Outcome Measure  Help needed turning from your back to your side while in a flat bed without using bedrails?: None   Help needed moving to and from a bed to a chair (including a wheelchair)?: A Little Help needed standing up from a chair using your arms (e.g., wheelchair or bedside chair)?: A Little Help needed to walk in hospital room?: A Little Help needed climbing 3-5 steps with a railing? : A Lot 6 Click Score: 15    End of Session Equipment Utilized During Treatment: Oxygen Activity Tolerance: Patient tolerated treatment well Patient left: in chair;with call bell/phone within reach Nurse Communication: Mobility status PT Visit Diagnosis: Unsteadiness on feet (R26.81);Difficulty in walking, not elsewhere classified (R26.2);Muscle weakness (generalized) (M62.81)     Time: 3799-0940 PT Time Calculation (min) (ACUTE ONLY): 17 min  Charges:  $Therapeutic Exercise: 8-22  mins                     Windell Norfolk, DPT, PN1   Supplemental Physical Therapist Dewy Rose    Pager [phone] Acute Rehab Office [phone]

## 2020-04-15 NOTE — Progress Notes (Signed)
PROGRESS NOTE                                                                                                                                                                                                             Patient Demographics:    [name], is a 79 y.o. female, DOB - 1940/11/29, ZOX:[id number]  Admit date - 03/24/2020   Admitting Physician Mckinley Jewel, MD  Outpatient Primary MD for the patient is [name], MD  LOS - 6   Chief Complaint  Patient presents with  . Shortness of Breath       Brief Narrative     [name] is a 79 y.o. female with medical history significant of hypertension, migraine headache, GERD, hyperlipidemia presents to emergency department with worsening shortness of breath.  Patient is fully vaccinated, she presents with COVID-19 symptoms, she was noted to be hypoxic, admitted for further work-up , unfortunately she kept having increased oxygen requirement. 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  No history of smoking, alcohol, licit drug use.   Subjective:    [name] today reports generalized weakness, fatigue, she reports dyspnea and cough.   Assessment  & Plan :    Principal Problem:   Acute hypoxemic respiratory failure due to COVID-19 [facility]) Active Problems:   Hyperlipidemia   Anxiety and depression   Migraine headache   Essential hypertension   GERD   Elevated liver enzymes    Acute hypoxemic respiratory failure secondary to COVID-19 pneumonia: -Patient presented with tachypnea, oxygen requirement this morning, she remains with significant oxygen requirement, this morning she has worsened oxygen requirement, she is still requiring 15 L high flow nasal cannula, and NRB intermittently . -Continue with IV steroids, she is on IV Solu-Medrol. -Continue with baricitinib -Resumed back on remdesivir 8/29 as LFTs trending  down. -Received Decadron and albuterol breathing treatment and Tylenol in ED. -Was encouraged to use incentive spirometry, flutter valve, -Continue with antitussives -Continue to trend inflammatory markers closely. -treated with IV Rocephin and azithromycin given procalcitonin of 0.7, procalcitonin is trending down. -D Dimer is again significantly elevated > 20, venous Dopplers negative x2, will check CTA chest, for now we will continue with full dose Lovenox specially she is high risk for thrombosis given significant inflammation and hypercoagulable state in the  setting of severe COVID-19 infection .  SpO2: 92 % O2 Flow Rate (L/min): 13 L/min   COVID-19 Labs  Recent Labs    04/13/20 0300 04/14/20 0310 04/15/20 0432  DDIMER 3.26* >20.00* >20.00*  FERRITIN 316* 277  --   CRP  --   --  6.5*    Lab Results  Component Value Date   SARSCOV2NAA POSITIVE (A) 04/04/2020   Elevated liver enzymes:  -Main significantly elevated, but trending down,this is most likely in the setting of hypotension on admission with liver shock, as well possibly due to Covid, Remdesivir has been held, now her ALT is less than 220, she would be resumed on remdesivir. -No acute findings abdominal ultrasound  Abdominal pain -Totally resolved, abdomen exam is benign today, lipase within normal limits, no acute findings on abdominal ultrasound .  Hypertension: -Remains on the lower side, continue to hold losartan  GERD: Continue PPI  Hyperlipidemia: Continue statin  Migraine headache: Continue Imitrex  Chronic back pain: Tramadol daily  Code Status : Full  Family Communication: discussed with the granddaughter Cyril Mourning daily.  Disposition Plan  :  Status is: Inpatient  Remains inpatient appropriate because:Hemodynamically unstable and IV treatments appropriate due to intensity of illness or inability to take PO   Dispo: The patient is from: Home              Anticipated d/c is to: Home               Anticipated d/c date is: > 3 days              Patient currently is medically stable to d/c.    Consults  :  none  Procedures  : none  DVT Prophylaxis  :  [place] lovenox  Lab Results  Component Value Date   PLT 395 04/15/2020     Antibiotics  :    Anti-infectives (From admission, onward)   Start     Dose/Rate Route Frequency Ordered Stop   04/13/20 1200  remdesivir 100 mg in sodium chloride 0.9 % 100 mL IVPB        100 mg 200 mL/hr over 30 Minutes Intravenous Daily 04/13/20 1150 04/17/20 0959   04/13/20 1100  remdesivir injection 100 mg  Status:  Discontinued        100 mg Intravenous Every 24 hours 04/13/20 0957 04/13/20 1153   04/12/20 1430  azithromycin (ZITHROMAX) tablet 500 mg        500 mg Oral Daily 04/12/20 1321 04/13/20 1218   04/10/20 1000  remdesivir 100 mg in sodium chloride 0.9 % 100 mL IVPB  Status:  Discontinued       "Followed by" Linked Group Details   100 mg 200 mL/hr over 30 Minutes Intravenous Daily 04/13/2020 1551 04/10/20 0800   03/27/2020 2200  hydroxychloroquine (PLAQUENIL) tablet 200 mg        200 mg Oral 2 times daily 04/08/2020 1653     03/25/2020 1600  remdesivir 200 mg in sodium chloride 0.9% 250 mL IVPB       "Followed by" Linked Group Details   200 mg 580 mL/hr over 30 Minutes Intravenous Once 03/25/2020 1551 04/02/2020 2139   03/26/2020 1600  azithromycin (ZITHROMAX) 500 mg in sodium chloride 0.9 % 250 mL IVPB  Status:  Discontinued        500 mg 250 mL/hr over 60 Minutes Intravenous Every 24 hours 03/19/2020 1552 04/12/20 1321   03/23/2020 1600  cefTRIAXone (ROCEPHIN) 1 g in sodium  chloride 0.9 % 100 mL IVPB        1 g 200 mL/hr over 30 Minutes Intravenous Every 24 hours 03/30/2020 1552 04/13/20 1641        Objective:   Vitals:   04/14/20 2315 04/15/20 0310 04/15/20 0410 04/15/20 0822  BP:      Pulse:      Resp: 20     Temp: 98.1 F (36.7 C) 98 F (36.7 C)  98.2 F (36.8 C)  TempSrc: Oral Oral    SpO2: 100%  92%   Weight:      Height:         Wt Readings from Last 3 Encounters:  04/10/20 73 kg  10/22/19 73.4 kg  04/16/19 73.9 kg    No intake or output data in the 24 hours ending 04/15/20 1123   Physical Exam  Awake Alert, extremely frail, sitting in bed, no apparent distress  symmetrical Chest wall movement, Good air movement bilaterally, CTAB RRR,No Gallops,Rubs or new Murmurs, No Parasternal Heave +ve B.Sounds, Abd Soft, No tenderness, No rebound - guarding or rigidity. No Cyanosis, Clubbing or edema, No new Rash or bruise      Data Review:    CBC Recent Labs  Lab 04/10/20 1526 04/10/20 1526 04/11/20 0251 04/12/20 0109 04/13/20 0300 04/14/20 0310 04/15/20 0432  WBC 19.8*   < > 16.3* 15.4* 16.8* 19.4* 20.3*  HGB 13.3   < > 10.9* 11.3* 11.5* 12.5 11.6*  HCT 41.1   < > 33.6* 35.0* 35.4* 38.9 36.1  PLT 423*   < > 387 410* 414* 361 395  MCV 92.2   < > 91.1 93.3 91.9 90.7 91.4  MCH 29.8   < > 29.5 30.1 29.9 29.1 29.4  MCHC 32.4   < > 32.4 32.3 32.5 32.1 32.1  RDW 13.1   < > 13.2 13.2 13.2 12.9 13.0  LYMPHSABS 0.8  --  0.9 1.1 0.8 0.6*  --   MONOABS 0.4  --  0.4 0.5 0.5 0.5  --   EOSABS 0.3  --  0.0 0.0 0.1 0.0  --   BASOSABS 0.1  --  0.0 0.1 0.1 0.1  --    < > = values in this interval not displayed.    Chemistries  Recent Labs  Lab 04/10/20 0518 04/10/20 1526 04/11/20 0251 04/12/20 0109 04/13/20 0300 04/14/20 0310 04/15/20 0432  NA 138   < > 142 142 143 140 140  K 3.7   < > 3.8 3.6 3.9 4.3 4.1  CL 99   < > 106 107 107 102 101  CO2 25   < > 22 24 23 25 26   GLUCOSE 155*   < > 160* 187* 153* 139* 154*  BUN 30*   < > 40* 33* 20 20 25*  CREATININE 1.00   < > 1.04* 1.11* 0.88 0.85 0.95  CALCIUM 9.1   < > 8.5* 8.5* 8.8* 8.9 8.7*  MG 2.9*  --  2.8* 2.7* 2.6* 2.3  --   AST 504*   < > 348* 180* 69* 46* 31  ALT 275*   < > 320* 257* 178* 137* 89*  ALKPHOS 118   < > 104 97 106 117 95  BILITOT 0.8   < > 0.7 0.4 0.2* 0.6 0.8   < > = values in this interval not displayed.    ------------------------------------------------------------------------------------------------------------------ No results for input(s): CHOL, HDL, LDLCALC, TRIG, CHOLHDL, LDLDIRECT in the last 72 hours.  Lab Results  Component Value Date   HGBA1C 5.6 10/22/2019   ------------------------------------------------------------------------------------------------------------------ No results for input(s): TSH, T4TOTAL, T3FREE, THYROIDAB in the last 72 hours.  Invalid input(s): FREET3 ------------------------------------------------------------------------------------------------------------------ Recent Labs    04/13/20 0300 04/14/20 0310  FERRITIN 316* 277    Coagulation profile No results for input(s): INR, PROTIME in the last 168 hours.  Recent Labs    04/14/20 0310 04/15/20 0432  DDIMER >20.00* >20.00*    Cardiac Enzymes No results for input(s): CKMB, TROPONINI, MYOGLOBIN in the last 168 hours.  Invalid input(s): CK ------------------------------------------------------------------------------------------------------------------    Component Value Date/Time   BNP 364.1 (H) 04/13/2020 0300    Inpatient Medications  Scheduled Meds: . AeroChamber Plus Flo-Vu Large  1 each Other Once  . albuterol  2 puff Inhalation Q6H  . vitamin C  500 mg Oral Daily  . atorvastatin  20 mg Oral Daily  . baricitinib  2 mg Oral Daily  . enoxaparin (LOVENOX) injection  70 mg Subcutaneous Q12H  . feeding supplement (ENSURE ENLIVE)  237 mL Oral BID BM  . gabapentin  300 mg Oral BID  . hydroxychloroquine  200 mg Oral BID  . methylPREDNISolone (SOLU-MEDROL) injection  40 mg Intravenous Q8H  . pantoprazole  40 mg Oral Daily  . zinc sulfate  220 mg Oral Daily   Continuous Infusions: . remdesivir 100 mg in NS 100 mL 100 mg (04/14/20 0900)   PRN Meds:.acetaminophen, chlorpheniramine-HYDROcodone, guaiFENesin-dextromethorphan, ondansetron **OR** ondansetron (ZOFRAN) IV,  traMADol  Micro Results Recent Results (from the past 240 hour(s))  SARS Coronavirus 2 by RT PCR (hospital order, performed in [place] hospital lab) Nasopharyngeal Nasopharyngeal Swab     Status: Abnormal   Collection Time: 03/28/2020 12:38 PM   Specimen: Nasopharyngeal Swab  Result Value Ref Range Status   SARS Coronavirus 2 POSITIVE (A) NEGATIVE Final    Comment: RESULT CALLED TO, READ BACK BY AND VERIFIED WITH: Precious Reel RN 15:30 04/11/2020 ([email or web address]) (NOTE) SARS-CoV-2 target nucleic acids are DETECTED  SARS-CoV-2 RNA is generally detectable in upper respiratory specimens  during the acute phase of infection.  Positive results are indicative  of the presence of the identified virus, but do not rule out bacterial infection or co-infection with other pathogens not detected by the test.  Clinical correlation with patient history and  other diagnostic information is necessary to determine patient infection status.  The expected result is negative.  Fact Sheet for Patients:   StrictlyIdeas.no   Fact Sheet for Healthcare Providers:   BankingDealers.co.za    This test is not yet approved or cleared by the [place] FDA and  has been authorized for detection and/or diagnosis of SARS-CoV-2 by FDA under an Emergency Use Authorization (EUA).  This EUA will remain in effect (meaning this  test can be used) for the duration of  the COVID-19 declaration under Section 564(b)(1) of the Act, 21 U.S.C. section 360-bbb-3(b)(1), unless the authorization is terminated or revoked sooner.  Performed at [facility], [place] [address], [place], [place] [address]   Blood Culture (routine x 2)     Status: None   Collection Time: 04/04/2020  1:02 PM   Specimen: BLOOD  Result Value Ref Range Status   Specimen Description BLOOD LEFT ANTECUBITAL  Final   Special Requests   Final    BOTTLES DRAWN AEROBIC AND ANAEROBIC Blood Culture results may not be  optimal due to an inadequate volume of blood received in culture bottles   Culture   Final  NO GROWTH 5 DAYS Performed at [facility], [place] [address], [place], [place] [address]    Report Status 04/14/2020 FINAL  Final  Blood Culture (routine x 2)     Status: None   Collection Time: 04/02/2020  1:07 PM   Specimen: BLOOD  Result Value Ref Range Status   Specimen Description BLOOD RIGHT ANTECUBITAL  Final   Special Requests   Final    BOTTLES DRAWN AEROBIC ONLY Blood Culture results may not be optimal due to an inadequate volume of blood received in culture bottles   Culture   Final    NO GROWTH 5 DAYS Performed at [facility], [place] [address] [place], [place] [address]    Report Status 04/14/2020 FINAL  Final    Radiology Reports US Abdomen Complete  Result Date: 04/10/2020 CLINICAL DATA:  Epigastric pain EXAM: ABDOMEN ULTRASOUND COMPLETE COMPARISON:  None. FINDINGS: Gallbladder: No gallstones or wall thickening visualized. No sonographic Murphy sign noted by sonographer. Common bile duct: Diameter: 3.3 mm Liver: Liver is echogenic. No focal hepatic abnormality. Portal vein is patent on color Doppler imaging with normal direction of blood flow towards the liver. IVC: No abnormality visualized. Pancreas: Visualized portion unremarkable. Spleen: Size and appearance within normal limits. Right Kidney: Length: 10 cm. Echogenicity within normal limits. No mass or hydronephrosis visualized. Left Kidney: Length: 10.4 cm. Echogenicity within normal limits. No hydronephrosis. Cyst off the lower pole measuring 3.1 cm. Abdominal aorta: No aneurysm visualized. Other findings: None. IMPRESSION: 1. Negative for gallstones or biliary dilatation 2. Echogenic liver consistent with steatosis and or hepatocellular disease. 3. Simple appearing left renal cyst Electronically Signed   By: Donavan Foil M.D.   On: 04/10/2020 18:12   DG Chest Portable 1 View  Result Date: 03/25/2020 CLINICAL DATA:   Shortness of breath.  COVID-19 exposure EXAM: PORTABLE CHEST 1 VIEW COMPARISON:  01/13/2012 FINDINGS: The heart size and mediastinal contours are within normal limits. Atherosclerotic calcification of the aortic knob. Diffuse increased interstitial markings throughout both lungs. No pleural effusion or pneumothorax. The visualized skeletal structures are unremarkable. IMPRESSION: Diffuse increased interstitial markings throughout both lungs, which may reflect edema versus atypical/viral infection. Electronically Signed   By: Davina Poke D.O.   On: 03/19/2020 13:36   DG Abd Portable 1V  Result Date: 04/10/2020 CLINICAL DATA:  Abdominal pain. EXAM: PORTABLE ABDOMEN - 1 VIEW COMPARISON:  None. FINDINGS: The bowel gas pattern is normal. No radio-opaque calculi or other significant radiographic abnormality are seen. IMPRESSION: Negative. Electronically Signed   By: Marijo Conception M.D.   On: 04/10/2020 15:17   VAS [place] LOWER EXTREMITY VENOUS (DVT)  Result Date: 04/15/2020  Lower Venous DVTStudy Indications: Elevated d dimer >20, aware of recent negative doppler.  Comparison Study: 04/11/20 previous Performing Technologist: Abram Sander RVS  Examination Guidelines: A complete evaluation includes B-mode imaging, spectral Doppler, color Doppler, and power Doppler as needed of all accessible portions of each vessel. Bilateral testing is considered an integral part of a complete examination. Limited examinations for reoccurring indications may be performed as noted. The reflux portion of the exam is performed with the patient in reverse Trendelenburg.  +---------+---------------+---------+-----------+----------+--------------+ RIGHT    CompressibilityPhasicitySpontaneityPropertiesThrombus Aging +---------+---------------+---------+-----------+----------+--------------+ CFV      Full           Yes      Yes                                  +---------+---------------+---------+-----------+----------+--------------+  SFJ      Full                                                        +---------+---------------+---------+-----------+----------+--------------+ FV Prox  Full                                                        +---------+---------------+---------+-----------+----------+--------------+ FV Mid   Full                                                        +---------+---------------+---------+-----------+----------+--------------+ FV DistalFull                                                        +---------+---------------+---------+-----------+----------+--------------+ PFV      Full                                                        +---------+---------------+---------+-----------+----------+--------------+ POP      Full           Yes      Yes                                 +---------+---------------+---------+-----------+----------+--------------+ PTV      Full                                                        +---------+---------------+---------+-----------+----------+--------------+ PERO     Full                                                        +---------+---------------+---------+-----------+----------+--------------+   +---------+---------------+---------+-----------+----------+--------------+ LEFT     CompressibilityPhasicitySpontaneityPropertiesThrombus Aging +---------+---------------+---------+-----------+----------+--------------+ CFV      Full           Yes      Yes                                 +---------+---------------+---------+-----------+----------+--------------+ SFJ      Full                                                        +---------+---------------+---------+-----------+----------+--------------+  FV Prox  Full                                                         +---------+---------------+---------+-----------+----------+--------------+ FV Mid   Full                                                        +---------+---------------+---------+-----------+----------+--------------+ FV DistalFull                                                        +---------+---------------+---------+-----------+----------+--------------+ PFV      Full                                                        +---------+---------------+---------+-----------+----------+--------------+ POP      Full           Yes      Yes                                 +---------+---------------+---------+-----------+----------+--------------+ PTV      Full                                                        +---------+---------------+---------+-----------+----------+--------------+ PERO     Full                                                        +---------+---------------+---------+-----------+----------+--------------+     Summary: BILATERAL: - No evidence of deep vein thrombosis seen in the lower extremities, bilaterally. - No evidence of superficial venous thrombosis in the lower extremities, bilaterally. -   *See table(s) above for measurements and observations.    Preliminary    VAS [place] LOWER EXTREMITY VENOUS (DVT)  Result Date: 04/11/2020  Lower Venous DVTStudy Indications: Elevated Ddimer.  Risk Factors: COVID 19 positive. Comparison Study: No prior studies. Performing Technologist: Oliver Hum RVT  Examination Guidelines: A complete evaluation includes B-mode imaging, spectral Doppler, color Doppler, and power Doppler as needed of all accessible portions of each vessel. Bilateral testing is considered an integral part of a complete examination. Limited examinations for reoccurring indications may be performed as noted. The reflux portion of the exam is performed with the patient in reverse Trendelenburg.   +---------+---------------+---------+-----------+----------+--------------+ RIGHT    CompressibilityPhasicitySpontaneityPropertiesThrombus Aging +---------+---------------+---------+-----------+----------+--------------+ CFV      Full           Yes      Yes                                 +---------+---------------+---------+-----------+----------+--------------+  SFJ      Full                                                        +---------+---------------+---------+-----------+----------+--------------+ FV Prox  Full                                                        +---------+---------------+---------+-----------+----------+--------------+ FV Mid   Full                                                        +---------+---------------+---------+-----------+----------+--------------+ FV DistalFull                                                        +---------+---------------+---------+-----------+----------+--------------+ PFV      Full                                                        +---------+---------------+---------+-----------+----------+--------------+ POP      Full           Yes      Yes                                 +---------+---------------+---------+-----------+----------+--------------+ PTV      Full                                                        +---------+---------------+---------+-----------+----------+--------------+ PERO     Full                                                        +---------+---------------+---------+-----------+----------+--------------+   +---------+---------------+---------+-----------+----------+--------------+ LEFT     CompressibilityPhasicitySpontaneityPropertiesThrombus Aging +---------+---------------+---------+-----------+----------+--------------+ CFV      Full           Yes      Yes                                  +---------+---------------+---------+-----------+----------+--------------+ SFJ      Full                                                        +---------+---------------+---------+-----------+----------+--------------+  FV Prox  Full                                                        +---------+---------------+---------+-----------+----------+--------------+ FV Mid   Full                                                        +---------+---------------+---------+-----------+----------+--------------+ FV DistalFull                                                        +---------+---------------+---------+-----------+----------+--------------+ PFV      Full                                                        +---------+---------------+---------+-----------+----------+--------------+ POP      Full           Yes      Yes                                 +---------+---------------+---------+-----------+----------+--------------+ PTV      Full                                                        +---------+---------------+---------+-----------+----------+--------------+ PERO     Full                                                        +---------+---------------+---------+-----------+----------+--------------+     Summary: RIGHT: - There is no evidence of deep vein thrombosis in the lower extremity.  - No cystic structure found in the popliteal fossa.  LEFT: - There is no evidence of deep vein thrombosis in the lower extremity.  - No cystic structure found in the popliteal fossa.  *See table(s) above for measurements and observations. Electronically signed by Servando Snare MD on 04/11/2020 at 4:56:04 PM.    Final      Phillips Climes M.D on 04/15/2020 at 11:23 AM    [facility] -  Office  [phone]

## 2020-04-16 ENCOUNTER — Inpatient Hospital Stay (HOSPITAL_COMMUNITY): Payer: Medicare Other

## 2020-04-16 LAB — COMPREHENSIVE METABOLIC PANEL
ALT: 73 U/L — ABNORMAL HIGH (ref 0–44)
AST: 32 U/L (ref 15–41)
Albumin: 2.4 g/dL — ABNORMAL LOW (ref 3.5–5.0)
Alkaline Phosphatase: 98 U/L (ref 38–126)
Anion gap: 14 (ref 5–15)
BUN: 29 mg/dL — ABNORMAL HIGH (ref 8–23)
CO2: 24 mmol/L (ref 22–32)
Calcium: 8.6 mg/dL — ABNORMAL LOW (ref 8.9–10.3)
Chloride: 103 mmol/L (ref 98–111)
Creatinine, Ser: 0.88 mg/dL (ref 0.44–1.00)
GFR calc Af Amer: >60 mL/min (ref >60)
GFR calc non Af Amer: >60 mL/min (ref >60)
Glucose, Bld: 127 mg/dL — ABNORMAL HIGH (ref 70–99)
Potassium: 4.4 mmol/L (ref 3.5–5.1)
Sodium: 141 mmol/L (ref 135–145)
Total Bilirubin: 0.5 mg/dL (ref 0.3–1.2)
Total Protein: 6.1 g/dL — ABNORMAL LOW (ref 6.5–8.1)

## 2020-04-16 LAB — CBC
HCT: 37.8 % (ref 36.0–46.0)
Hemoglobin: 11.9 g/dL — ABNORMAL LOW (ref 12.0–15.0)
MCH: 29 pg (ref 26.0–34.0)
MCHC: 31.5 g/dL (ref 30.0–36.0)
MCV: 92 fL (ref 80.0–100.0)
Platelets: 447 10*3/uL — ABNORMAL HIGH (ref 150–400)
RBC: 4.11 MIL/uL (ref 3.87–5.11)
RDW: 13.1 % (ref 11.5–15.5)
WBC: 21 10*3/uL — ABNORMAL HIGH (ref 4.0–10.5)
nRBC: 0 % (ref 0.0–0.2)

## 2020-04-16 LAB — MAGNESIUM: Magnesium: 2.6 mg/dL — ABNORMAL HIGH (ref 1.7–2.4)

## 2020-04-16 LAB — PROCALCITONIN: Procalcitonin: <0.1 ng/mL

## 2020-04-16 LAB — C-REACTIVE PROTEIN: CRP: 9.5 mg/dL — ABNORMAL HIGH (ref <1.0)

## 2020-04-16 LAB — BRAIN NATRIURETIC PEPTIDE: B Natriuretic Peptide: 66.6 pg/mL (ref 0.0–100.0)

## 2020-04-16 LAB — D-DIMER, QUANTITATIVE: D-Dimer, Quant: 14.11 ug/mL-FEU — ABNORMAL HIGH (ref 0.00–0.50)

## 2020-04-16 MED ORDER — BARICITINIB 2 MG PO TABS
4.0000 mg | ORAL_TABLET | Freq: Every day | ORAL | Status: DC
  Administered 2020-04-17 – 2020-04-19 (×3): 4 mg via ORAL
  Filled 2020-04-16 (×3): qty 2

## 2020-04-16 MED ORDER — METHYLPREDNISOLONE SODIUM SUCC 40 MG IJ SOLR
40.0000 mg | Freq: Two times a day (BID) | INTRAMUSCULAR | Status: DC
  Administered 2020-04-16 – 2020-04-22 (×13): 40 mg via INTRAVENOUS
  Filled 2020-04-16 (×14): qty 1

## 2020-04-16 NOTE — Progress Notes (Signed)
PROGRESS NOTE                                                                                                                                                                                                             Patient Demographics:    [name], is a 79 y.o. female, DOB - 08/11/1941, OZH:[id number]  Outpatient Primary MD for the patient is [name], MD    LOS - 7  Admit date - 03/28/2020    Chief Complaint  Patient presents with  . Shortness of Breath       Brief Narrative - Cassandra A Suttonis a 79 y.o.femalewith medical history significant ofhypertension, migraine headache, GERD, hyperlipidemia presents to emergency department with worsening shortness of breath.  Patient is fully vaccinated, she presents with COVID-19 symptoms, she was noted to be hypoxic, admitted for further work-up , unfortunately she kept having increased oxygen requirement. 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 No history of smoking, alcohol, licit drug use.    Subjective:    [name] today has, No headache, No chest pain, No abdominal pain - No Nausea, No new weakness tingling or numbness, +ve SOB.     Assessment  & Plan :     1. Acute Hypoxic Resp. Failure due to Acute Covid 19 Viral Pneumonitis during the ongoing 2020 Covid 19 Pandemic - she has incurred severe parenchymal lung injury, she is severely hypoxic and being treated with combination of IV steroids, remdesivir and Baricitinib. Quite tenuous on 35 L of heated high flow. CTA ruled out PE.  Encouraged the patient to sit up in chair in the daytime use I-S and flutter valve for pulmonary toiletry and then prone in bed when at night.  Will advance activity and titrate down oxygen as possible.   SpO2: 92 % O2 Flow Rate (L/min): 35 L/min FiO2 (%): 90 %  Recent Labs  Lab  0000 04/01/2020 1238 04/11/2020 1248  03/31/2020 1302 04/10/20 0518 04/10/20 1526 04/10/20 1526 04/11/20 0251 04/11/20 0251 04/12/20 0109 04/13/20 0300 04/14/20 0310 04/15/20 0432 04/16/20 0629 04/16/20 0718  WBC  --   --  15.9*  --    < > 19.8*   < > 16.3*   < > 15.4* 16.8* 19.4* 20.3* 21.0*  --   PLT  --   --  367  --    < > 423*   < > 387   < > 410* 414* 361 395 447*  --   CRP  --   --   --  36.3*  --  28.4*  --   --   --   --   --   --  6.5* 9.5*  --   BNP  --   --   --  86.1  --   --   --   --   --   --  364.1*  --   --   --  66.6  DDIMER  --   --   --  2.36*   < >  --   --  4.28*   < > 2.37* 3.26* >20.00* >20.00* 14.11*  --   PROCALCITON   < >  --   --  0.72  --  0.64  --  0.56  --  0.19  --  <0.10 0.12  --   --   AST  --   --  91*  --    < > 697*   < > 348*   < > 180* 69* 46* 31 32  --   ALT  --   --  63*  --    < > 474*   < > 320*   < > 257* 178* 137* 89* 73*  --   ALKPHOS  --   --  80  --    < > 134*   < > 104   < > 97 106 117 95 98  --   BILITOT  --   --  0.8  --    < > 1.4*   < > 0.7   < > 0.4 0.2* 0.6 0.8 0.5  --   ALBUMIN  --   --  3.0*  --    < > 2.9*   < > 2.4*   < > 2.4* 2.5* 2.7* 2.4* 2.4*  --   LATICACIDVEN  --   --  2.1*  --   --   --   --   --   --   --   --   --   --   --   --   SARSCOV2NAA  --  POSITIVE*  --   --   --   --   --   --   --   --   --   --   --   --   --    < > = values in this interval not displayed.      2. Extremely elevated D-dimer. With negative CTA and leg ultrasound. On full dose Lovenox. This will be continued till D-dimer dips below 2 as the risk of developing a clot in the future is extremely high due to intense inflammation.  3. Asymptomatic transaminitis due to COVID-19 viral infection. Stable ultrasound showing fatty liver. Monitor trend which is improving.  4. Abdominal pain. Resolved. Likely due to constipation, stable abdominal ultrasound.  5. Essential hypertension. Currently stable off of medications.  6. GERD. On PPI.  7. Dyslipidemia. On statin.  8. HXof  chronic back pain and migraine headaches. On combination of tramadol and Imitrex.    Condition - Extremely Guarded  Family Communication  :  granddaughter Cyril Mourning [phone] on 04/16/20, guarded prognosis communicated.  Code Status : Full  Consults  :  None  Procedures  :    CTA -  1. No evidence of acute pulmonary embolism. 2. Multifocal areas of mixed ground-glass and consolidative opacity with a peripheral predominance with more dense opacification in the dependent portions of the lungs likely reflecting some superimposed atelectatic changes. Findings are favored to represent multifocal pneumonia in the setting of COVID-19 positivity. 3. Trace left pleural effusion. 4. Aortic Atherosclerosis (ICD10-I70.0). 5. Coronary artery calcifications are present. Please note that the presence of coronary artery calcium documents the presence of coronary artery disease, the severity of this disease and any potential stenosis cannot be assessed on this non-gated CT examination.  Leg [place] -  RIGHT: - There is no evidence of deep vein thrombosis in the lower extremity.  - No cystic structure found in the popliteal fossa.  LEFT: - There is no evidence of deep vein thrombosis in the lower extremity.  - No cystic structure found in the popliteal fossa.  Abd [place] - 1. Negative for gallstones or biliary dilatation 2. Echogenic liver consistent with steatosis and or hepatocellular disease. 3. Simple appearing left renal cyst   PUD Prophylaxis : None  Disposition Plan  :    Status is: Inpatient  Remains inpatient appropriate because:IV treatments appropriate due to intensity of illness or inability to take PO   Dispo: The patient is from: Home              Anticipated d/c is to: Home              Anticipated d/c date is: > 3 days              Patient currently is not medically stable to d/c.   DVT Prophylaxis  :  Lovenox    Lab Results  Component Value Date   PLT 447 (H) 04/16/2020    Diet :    Diet Order            Diet heart healthy/carb modified Room service appropriate? Yes; Fluid consistency: Thin  Diet effective now                  Inpatient Medications  Scheduled Meds: . AeroChamber Plus Flo-Vu Large  1 each Other Once  . albuterol  2 puff Inhalation Q6H  . vitamin C  500 mg Oral Daily  . atorvastatin  20 mg Oral Daily  . baricitinib  2 mg Oral Daily  . enoxaparin (LOVENOX) injection  70 mg Subcutaneous Q12H  . feeding supplement (ENSURE ENLIVE)  237 mL Oral BID BM  . gabapentin  300 mg Oral BID  . hydroxychloroquine  200 mg Oral BID  . methylPREDNISolone (SOLU-MEDROL) injection  40 mg Intravenous BID  . pantoprazole  40 mg Oral Daily  . zinc sulfate  220 mg Oral Daily   Continuous Infusions: PRN Meds:.acetaminophen, chlorpheniramine-HYDROcodone, guaiFENesin-dextromethorphan, [DISCONTINUED] ondansetron **OR** ondansetron (ZOFRAN) IV, traMADol  Antibiotics  :    Anti-infectives (From admission, onward)   Start     Dose/Rate Route Frequency Ordered Stop   04/13/20 1200  remdesivir 100 mg in sodium chloride 0.9 % 100 mL IVPB        100 mg 200 mL/hr over 30 Minutes Intravenous Daily 04/13/20 1150 04/16/20 1123   04/13/20 1100  remdesivir injection 100 mg  Status:  Discontinued        100 mg Intravenous Every 24 hours 04/13/20 0957 04/13/20 1153   04/12/20 1430  azithromycin (ZITHROMAX) tablet 500 mg        500 mg Oral Daily 04/12/20 1321 04/13/20 1218  04/10/20 1000  remdesivir 100 mg in sodium chloride 0.9 % 100 mL IVPB  Status:  Discontinued       "Followed by" Linked Group Details   100 mg 200 mL/hr over 30 Minutes Intravenous Daily 04/08/2020 1551 04/10/20 0800   03/17/2020 2200  hydroxychloroquine (PLAQUENIL) tablet 200 mg        200 mg Oral 2 times daily 03/19/2020 1653     04/07/2020 1600  remdesivir 200 mg in sodium chloride 0.9% 250 mL IVPB       "Followed by" Linked Group Details   200 mg 580 mL/hr over 30 Minutes Intravenous Once 04/14/2020 1551  04/01/2020 2139   04/06/2020 1600  azithromycin (ZITHROMAX) 500 mg in sodium chloride 0.9 % 250 mL IVPB  Status:  Discontinued        500 mg 250 mL/hr over 60 Minutes Intravenous Every 24 hours 03/31/2020 1552 04/12/20 1321   04/13/2020 1600  cefTRIAXone (ROCEPHIN) 1 g in sodium chloride 0.9 % 100 mL IVPB        1 g 200 mL/hr over 30 Minutes Intravenous Every 24 hours 03/31/2020 1552 04/13/20 1641       Time Spent in minutes  30   Lala Lund M.D on 04/16/2020 at 11:36 AM  To page go to [email or web address] - password [facility]  [facility] -  Office  [phone]     See all Orders from today for further details    Objective:   Vitals:   04/16/20 0243 04/16/20 0348 04/16/20 0730 04/16/20 0846  BP:  127/62 (!) 126/57   Pulse:  88 79 91  Resp: (!) 26 20 (!) 25 (!) 24  Temp:  99.2 F (37.3 C) 98.6 F (37 C)   TempSrc:  Axillary Oral   SpO2: 94% 93% 92% 92%  Weight:      Height:        Wt Readings from Last 3 Encounters:  04/10/20 73 kg  10/22/19 73.4 kg  04/16/19 73.9 kg    No intake or output data in the 24 hours ending 04/16/20 1136   Physical Exam  Awake Alert, No new F.N deficits, Normal affect [place].AT,PERRAL Supple Neck,No JVD, No cervical lymphadenopathy appriciated.  Symmetrical Chest wall movement, Good air movement bilaterally, CTAB RRR,No Gallops,Rubs or new Murmurs, No Parasternal Heave +ve B.Sounds, Abd Soft, No tenderness, No organomegaly appriciated, No rebound - guarding or rigidity. No Cyanosis, Clubbing or edema, No new Rash or bruise     Data Review:    CBC Recent Labs  Lab 04/10/20 1526 04/10/20 1526 04/11/20 0251 04/11/20 0251 04/12/20 0109 04/13/20 0300 04/14/20 0310 04/15/20 0432 04/16/20 0629  WBC 19.8*   < > 16.3*   < > 15.4* 16.8* 19.4* 20.3* 21.0*  HGB 13.3   < > 10.9*   < > 11.3* 11.5* 12.5 11.6* 11.9*  HCT 41.1   < > 33.6*   < > 35.0* 35.4* 38.9 36.1 37.8  PLT 423*   < > 387   < > 410* 414* 361 395 447*  MCV 92.2   < > 91.1   <  > 93.3 91.9 90.7 91.4 92.0  MCH 29.8   < > 29.5   < > 30.1 29.9 29.1 29.4 29.0  MCHC 32.4   < > 32.4   < > 32.3 32.5 32.1 32.1 31.5  RDW 13.1   < > 13.2   < > 13.2 13.2 12.9 13.0 13.1  LYMPHSABS 0.8  --  0.9  --  1.1 0.8 0.6*  --   --   MONOABS 0.4  --  0.4  --  0.5 0.5 0.5  --   --   EOSABS 0.3  --  0.0  --  0.0 0.1 0.0  --   --   BASOSABS 0.1  --  0.0  --  0.1 0.1 0.1  --   --    < > = values in this interval not displayed.    Recent Labs  Lab  0000 03/25/2020 1248 03/31/2020 1302 03/21/2020 1930 04/10/20 1526 04/10/20 1526 04/11/20 0251 04/11/20 0251 04/12/20 0109 04/13/20 0300 04/14/20 0310 04/15/20 0432 04/16/20 0629 04/16/20 0718 04/16/20 0723  NA  --  137  --    < > 142   < > 142   < > 142 143 140 140 141  --   --   K  --  3.9  --    < > 4.2   < > 3.8   < > 3.6 3.9 4.3 4.1 4.4  --   --   CL  --  96*  --    < > 103   < > 106   < > 107 107 102 101 103  --   --   CO2  --  26  --    < > 23   < > 22   < > 24 23 25 26 24   --   --   GLUCOSE  --  141*  --    < > 146*   < > 160*   < > 187* 153* 139* 154* 127*  --   --   BUN  --  24*  --    < > 37*   < > 40*   < > 33* 20 20 25* 29*  --   --   CREATININE  --  0.97  --    < > 0.98   < > 1.04*   < > 1.11* 0.88 0.85 0.95 0.88  --   --   CALCIUM  --  9.1  --    < > 9.1   < > 8.5*   < > 8.5* 8.8* 8.9 8.7* 8.6*  --   --   AST  --  91*  --    < > 697*   < > 348*   < > 180* 69* 46* 31 32  --   --   ALT  --  63*  --    < > 474*   < > 320*   < > 257* 178* 137* 89* 73*  --   --   ALKPHOS  --  80  --    < > 134*   < > 104   < > 97 106 117 95 98  --   --   BILITOT  --  0.8  --    < > 1.4*   < > 0.7   < > 0.4 0.2* 0.6 0.8 0.5  --   --   ALBUMIN  --  3.0*  --    < > 2.9*   < > 2.4*   < > 2.4* 2.5* 2.7* 2.4* 2.4*  --   --   MG  --   --   --    < >  --   --  2.8*  --  2.7* 2.6* 2.3  --   --   --  2.6*  CRP  --   --  36.3*  --  28.4*  --   --   --   --   --   --  6.5* 9.5*  --   --   DDIMER  --   --  2.36*   < >  --   --  4.28*   < > 2.37* 3.26*  >20.00* >20.00* 14.11*  --   --   PROCALCITON   < >  --  0.72  --  0.64  --  0.56  --  0.19  --  <0.10 0.12  --   --   --   LATICACIDVEN  --  2.1*  --   --   --   --   --   --   --   --   --   --   --   --   --   BNP  --   --  86.1  --   --   --   --   --   --  364.1*  --   --   --  66.6  --    < > = values in this interval not displayed.    ------------------------------------------------------------------------------------------------------------------ No results for input(s): CHOL, HDL, LDLCALC, TRIG, CHOLHDL, LDLDIRECT in the last 72 hours.  Lab Results  Component Value Date   HGBA1C 5.6 10/22/2019   ------------------------------------------------------------------------------------------------------------------ No results for input(s): TSH, T4TOTAL, T3FREE, THYROIDAB in the last 72 hours.  Invalid input(s): FREET3  Cardiac Enzymes No results for input(s): CKMB, TROPONINI, MYOGLOBIN in the last 168 hours.  Invalid input(s): CK ------------------------------------------------------------------------------------------------------------------    Component Value Date/Time   BNP 66.6 04/16/2020 0718    Micro Results Recent Results (from the past 240 hour(s))  SARS Coronavirus 2 by RT PCR (hospital order, performed in [facility] hospital lab) Nasopharyngeal Nasopharyngeal Swab     Status: Abnormal   Collection Time: 03/21/2020 12:38 PM   Specimen: Nasopharyngeal Swab  Result Value Ref Range Status   SARS Coronavirus 2 POSITIVE (A) NEGATIVE Final    Comment: RESULT CALLED TO, READ BACK BY AND VERIFIED WITH: Precious Reel RN 15:30 03/29/2020 ([email or web address]) (NOTE) SARS-CoV-2 target nucleic acids are DETECTED  SARS-CoV-2 RNA is generally detectable in upper respiratory specimens  during the acute phase of infection.  Positive results are indicative  of the presence of the identified virus, but do not rule out bacterial infection or co-infection with other pathogens not detected by the  test.  Clinical correlation with patient history and  other diagnostic information is necessary to determine patient infection status.  The expected result is negative.  Fact Sheet for Patients:   StrictlyIdeas.no   Fact Sheet for Healthcare Providers:   BankingDealers.co.za    This test is not yet approved or cleared by the [place] FDA and  has been authorized for detection and/or diagnosis of SARS-CoV-2 by FDA under an Emergency Use Authorization (EUA).  This EUA will remain in effect (meaning this  test can be used) for the duration of  the COVID-19 declaration under Section 564(b)(1) of the Act, 21 U.S.C. section 360-bbb-3(b)(1), unless the authorization is terminated or revoked sooner.  Performed at [facility], [place] [address] [place], [place] [address]   Blood Culture (routine x 2)     Status: None   Collection Time: 03/27/2020  1:02 PM   Specimen: BLOOD  Result Value Ref Range Status   Specimen Description BLOOD LEFT ANTECUBITAL  Final   Special Requests   Final  BOTTLES DRAWN AEROBIC AND ANAEROBIC Blood Culture results may not be optimal due to an inadequate volume of blood received in culture bottles   Culture   Final    NO GROWTH 5 DAYS Performed at [facility], [place] [address] [place], [place] [address]    Report Status 04/14/2020 FINAL  Final  Blood Culture (routine x 2)     Status: None   Collection Time: 04/04/2020  1:07 PM   Specimen: BLOOD  Result Value Ref Range Status   Specimen Description BLOOD RIGHT ANTECUBITAL  Final   Special Requests   Final    BOTTLES DRAWN AEROBIC ONLY Blood Culture results may not be optimal due to an inadequate volume of blood received in culture bottles   Culture   Final    NO GROWTH 5 DAYS Performed at [facility], [place] [address] [place], [place] [address]    Report Status 04/14/2020 FINAL  Final    Radiology Reports CT ANGIO CHEST PE W OR WO  CONTRAST  Result Date: 04/15/2020 CLINICAL DATA:  PE suspected, COVID-19 positive EXAM: CT ANGIOGRAPHY CHEST WITH CONTRAST TECHNIQUE: Multidetector CT imaging of the chest was performed using the standard protocol during bolus administration of intravenous contrast. Multiplanar CT image reconstructions and MIPs were obtained to evaluate the vascular anatomy. CONTRAST:  53mL OMNIPAQUE IOHEXOL 350 MG/ML SOLN COMPARISON:  Radiograph 04/06/2020 FINDINGS: Cardiovascular: Satisfactory opacification the pulmonary arteries to the segmental level. No pulmonary artery filling defects are identified. Central pulmonary arteries are normal caliber. Normal heart size. No pericardial effusion. Scattered coronary artery calcifications are present. Atherosclerotic plaque within the normal caliber aorta. No acute luminal abnormality nor periaortic stranding hemorrhage. Shared origin of the brachiocephalic and left common carotid artery. Minimal plaque in the otherwise normal proximal great vessels. Mediastinum/Nodes: Few scattered low-attenuation subcentimeter mediastinal and hilar nodes are present. No enlarged mediastinal, hilar or axillary adenopathy. No acute abnormality of the trachea or esophagus. Thyroid gland demonstrates few subcentimeter hypoattenuating foci, without significant enlargement of the gland. No further imaging follow-up is warranted in a patient of this age (Reference: J Am Coll Radiol. 2015 Feb;12(2): 143-50). Lungs/Pleura: There are multifocal areas of mixed ground-glass and consolidative opacity with a peripheral predominance with more dense opacification in the dependent portions of the lungs likely reflecting some superimposed atelectatic changes. There is a trace left effusion as well. No pneumothorax or right effusion. Small air cyst seen in the right lung base (5/67). Upper Abdomen: No acute abnormalities present in the visualized portions of the upper abdomen. Small accessory splenule  Musculoskeletal: Multilevel discogenic and facet degenerative changes, maximal C5-6 where sclerotic, likely Modic type endplate changes are present with some mild retrolisthesis. Exaggerated thoracic kyphosis is noted with a slight pectus deformity of the chest (Haller index 2.55). No acute or suspicious osseous lesions. Review of the MIP images confirms the above findings. IMPRESSION: 1. No evidence of acute pulmonary embolism. 2. Multifocal areas of mixed ground-glass and consolidative opacity with a peripheral predominance with more dense opacification in the dependent portions of the lungs likely reflecting some superimposed atelectatic changes. Findings are favored to represent multifocal pneumonia in the setting of COVID-19 positivity. 3. Trace left pleural effusion. 4. Aortic Atherosclerosis (ICD10-I70.0). 5. Coronary artery calcifications are present. Please note that the presence of coronary artery calcium documents the presence of coronary artery disease, the severity of this disease and any potential stenosis cannot be assessed on this non-gated CT examination. Electronically Signed   By: March Rummage  [facility] M.D.   On: 04/15/2020 16:00   US Abdomen Complete  Result Date: 04/10/2020 CLINICAL DATA:  Epigastric pain EXAM: ABDOMEN ULTRASOUND COMPLETE COMPARISON:  None. FINDINGS: Gallbladder: No gallstones or wall thickening visualized. No sonographic Murphy sign noted by sonographer. Common bile duct: Diameter: 3.3 mm Liver: Liver is echogenic. No focal hepatic abnormality. Portal vein is patent on color Doppler imaging with normal direction of blood flow towards the liver. IVC: No abnormality visualized. Pancreas: Visualized portion unremarkable. Spleen: Size and appearance within normal limits. Right Kidney: Length: 10 cm. Echogenicity within normal limits. No mass or hydronephrosis visualized. Left Kidney: Length: 10.4 cm. Echogenicity within normal limits. No hydronephrosis. Cyst off the lower pole measuring  3.1 cm. Abdominal aorta: No aneurysm visualized. Other findings: None. IMPRESSION: 1. Negative for gallstones or biliary dilatation 2. Echogenic liver consistent with steatosis and or hepatocellular disease. 3. Simple appearing left renal cyst Electronically Signed   By: Donavan Foil M.D.   On: 04/10/2020 18:12   DG Chest Port 1 View  Result Date: 04/16/2020 CLINICAL DATA:  Shortness of breath, COVID-19 positive. EXAM: PORTABLE CHEST 1 VIEW COMPARISON:  April 09, 2020. FINDINGS: The heart size and mediastinal contours are within normal limits. No pneumothorax or pleural effusion is noted. Stable mild diffuse interstitial reticular densities are noted in the lungs bilaterally concerning for atypical inflammation. The visualized skeletal structures are unremarkable. IMPRESSION: Stable mild diffuse interstitial reticular densities are noted in the lungs bilaterally concerning for atypical inflammation. Electronically Signed   By: Marijo Conception M.D.   On: 04/16/2020 08:13   DG Chest Portable 1 View  Result Date: 04/11/2020 CLINICAL DATA:  Shortness of breath.  COVID-19 exposure EXAM: PORTABLE CHEST 1 VIEW COMPARISON:  01/13/2012 FINDINGS: The heart size and mediastinal contours are within normal limits. Atherosclerotic calcification of the aortic knob. Diffuse increased interstitial markings throughout both lungs. No pleural effusion or pneumothorax. The visualized skeletal structures are unremarkable. IMPRESSION: Diffuse increased interstitial markings throughout both lungs, which may reflect edema versus atypical/viral infection. Electronically Signed   By: Davina Poke D.O.   On: 04/11/2020 13:36   DG Abd Portable 1V  Result Date: 04/10/2020 CLINICAL DATA:  Abdominal pain. EXAM: PORTABLE ABDOMEN - 1 VIEW COMPARISON:  None. FINDINGS: The bowel gas pattern is normal. No radio-opaque calculi or other significant radiographic abnormality are seen. IMPRESSION: Negative. Electronically Signed   By: Marijo Conception M.D.   On: 04/10/2020 15:17   VAS [place] LOWER EXTREMITY VENOUS (DVT)  Result Date: 04/15/2020  Lower Venous DVTStudy Indications: Elevated d dimer >20, aware of recent negative doppler.  Comparison Study: 04/11/20 previous Performing Technologist: Abram Sander RVS  Examination Guidelines: A complete evaluation includes B-mode imaging, spectral Doppler, color Doppler, and power Doppler as needed of all accessible portions of each vessel. Bilateral testing is considered an integral part of a complete examination. Limited examinations for reoccurring indications may be performed as noted. The reflux portion of the exam is performed with the patient in reverse Trendelenburg.  +---------+---------------+---------+-----------+----------+--------------+ RIGHT    CompressibilityPhasicitySpontaneityPropertiesThrombus Aging +---------+---------------+---------+-----------+----------+--------------+ CFV      Full           Yes      Yes                                 +---------+---------------+---------+-----------+----------+--------------+ SFJ      Full                                                        +---------+---------------+---------+-----------+----------+--------------+  FV Prox  Full                                                        +---------+---------------+---------+-----------+----------+--------------+ FV Mid   Full                                                        +---------+---------------+---------+-----------+----------+--------------+ FV DistalFull                                                        +---------+---------------+---------+-----------+----------+--------------+ PFV      Full                                                        +---------+---------------+---------+-----------+----------+--------------+ POP      Full           Yes      Yes                                  +---------+---------------+---------+-----------+----------+--------------+ PTV      Full                                                        +---------+---------------+---------+-----------+----------+--------------+ PERO     Full                                                        +---------+---------------+---------+-----------+----------+--------------+   +---------+---------------+---------+-----------+----------+--------------+ LEFT     CompressibilityPhasicitySpontaneityPropertiesThrombus Aging +---------+---------------+---------+-----------+----------+--------------+ CFV      Full           Yes      Yes                                 +---------+---------------+---------+-----------+----------+--------------+ SFJ      Full                                                        +---------+---------------+---------+-----------+----------+--------------+ FV Prox  Full                                                        +---------+---------------+---------+-----------+----------+--------------+  FV Mid   Full                                                        +---------+---------------+---------+-----------+----------+--------------+ FV DistalFull                                                        +---------+---------------+---------+-----------+----------+--------------+ PFV      Full                                                        +---------+---------------+---------+-----------+----------+--------------+ POP      Full           Yes      Yes                                 +---------+---------------+---------+-----------+----------+--------------+ PTV      Full                                                        +---------+---------------+---------+-----------+----------+--------------+ PERO     Full                                                         +---------+---------------+---------+-----------+----------+--------------+     Summary: BILATERAL: - No evidence of deep vein thrombosis seen in the lower extremities, bilaterally. - No evidence of superficial venous thrombosis in the lower extremities, bilaterally. -   *See table(s) above for measurements and observations. Electronically signed by Harold Barban MD on 04/15/2020 at 8:50:57 PM.    Final    VAS [place] LOWER EXTREMITY VENOUS (DVT)  Result Date: 04/11/2020  Lower Venous DVTStudy Indications: Elevated Ddimer.  Risk Factors: COVID 19 positive. Comparison Study: No prior studies. Performing Technologist: Oliver Hum RVT  Examination Guidelines: A complete evaluation includes B-mode imaging, spectral Doppler, color Doppler, and power Doppler as needed of all accessible portions of each vessel. Bilateral testing is considered an integral part of a complete examination. Limited examinations for reoccurring indications may be performed as noted. The reflux portion of the exam is performed with the patient in reverse Trendelenburg.  +---------+---------------+---------+-----------+----------+--------------+ RIGHT    CompressibilityPhasicitySpontaneityPropertiesThrombus Aging +---------+---------------+---------+-----------+----------+--------------+ CFV      Full           Yes      Yes                                 +---------+---------------+---------+-----------+----------+--------------+ SFJ      Full                                                        +---------+---------------+---------+-----------+----------+--------------+  FV Prox  Full                                                        +---------+---------------+---------+-----------+----------+--------------+ FV Mid   Full                                                        +---------+---------------+---------+-----------+----------+--------------+ FV DistalFull                                                         +---------+---------------+---------+-----------+----------+--------------+ PFV      Full                                                        +---------+---------------+---------+-----------+----------+--------------+ POP      Full           Yes      Yes                                 +---------+---------------+---------+-----------+----------+--------------+ PTV      Full                                                        +---------+---------------+---------+-----------+----------+--------------+ PERO     Full                                                        +---------+---------------+---------+-----------+----------+--------------+   +---------+---------------+---------+-----------+----------+--------------+ LEFT     CompressibilityPhasicitySpontaneityPropertiesThrombus Aging +---------+---------------+---------+-----------+----------+--------------+ CFV      Full           Yes      Yes                                 +---------+---------------+---------+-----------+----------+--------------+ SFJ      Full                                                        +---------+---------------+---------+-----------+----------+--------------+ FV Prox  Full                                                        +---------+---------------+---------+-----------+----------+--------------+  FV Mid   Full                                                        +---------+---------------+---------+-----------+----------+--------------+ FV DistalFull                                                        +---------+---------------+---------+-----------+----------+--------------+ PFV      Full                                                        +---------+---------------+---------+-----------+----------+--------------+ POP      Full           Yes      Yes                                  +---------+---------------+---------+-----------+----------+--------------+ PTV      Full                                                        +---------+---------------+---------+-----------+----------+--------------+ PERO     Full                                                        +---------+---------------+---------+-----------+----------+--------------+     Summary: RIGHT: - There is no evidence of deep vein thrombosis in the lower extremity.  - No cystic structure found in the popliteal fossa.  LEFT: - There is no evidence of deep vein thrombosis in the lower extremity.  - No cystic structure found in the popliteal fossa.  *See table(s) above for measurements and observations. Electronically signed by Servando Snare MD on 04/11/2020 at 4:56:04 PM.    Final

## 2020-04-16 NOTE — Plan of Care (Signed)

## 2020-04-16 NOTE — Progress Notes (Signed)
   04/16/20 0243  Assess: MEWS Score  MEWS Temp 0  Pt up and moving, RR elevated no signs of distress. Will continue to monitor pt.

## 2020-04-16 DEATH — deceased

## 2020-04-17 ENCOUNTER — Inpatient Hospital Stay (HOSPITAL_COMMUNITY): Payer: Medicare Other

## 2020-04-17 LAB — PROCALCITONIN: Procalcitonin: <0.1 ng/mL

## 2020-04-17 LAB — COMPREHENSIVE METABOLIC PANEL
ALT: 75 U/L — ABNORMAL HIGH (ref 0–44)
AST: 39 U/L (ref 15–41)
Albumin: 2.6 g/dL — ABNORMAL LOW (ref 3.5–5.0)
Alkaline Phosphatase: 113 U/L (ref 38–126)
Anion gap: 15 (ref 5–15)
BUN: 27 mg/dL — ABNORMAL HIGH (ref 8–23)
CO2: 22 mmol/L (ref 22–32)
Calcium: 8.6 mg/dL — ABNORMAL LOW (ref 8.9–10.3)
Chloride: 104 mmol/L (ref 98–111)
Creatinine, Ser: 0.92 mg/dL (ref 0.44–1.00)
GFR calc Af Amer: >60 mL/min (ref >60)
GFR calc non Af Amer: 60 mL/min — ABNORMAL LOW (ref >60)
Glucose, Bld: 124 mg/dL — ABNORMAL HIGH (ref 70–99)
Potassium: 4.4 mmol/L (ref 3.5–5.1)
Sodium: 141 mmol/L (ref 135–145)
Total Bilirubin: 1 mg/dL (ref 0.3–1.2)
Total Protein: 6.1 g/dL — ABNORMAL LOW (ref 6.5–8.1)

## 2020-04-17 LAB — MAGNESIUM: Magnesium: 2.5 mg/dL — ABNORMAL HIGH (ref 1.7–2.4)

## 2020-04-17 LAB — CBC
HCT: 39.4 % (ref 36.0–46.0)
Hemoglobin: 12.6 g/dL (ref 12.0–15.0)
MCH: 29.9 pg (ref 26.0–34.0)
MCHC: 32 g/dL (ref 30.0–36.0)
MCV: 93.6 fL (ref 80.0–100.0)
Platelets: 528 10*3/uL — ABNORMAL HIGH (ref 150–400)
RBC: 4.21 MIL/uL (ref 3.87–5.11)
RDW: 13.2 % (ref 11.5–15.5)
WBC: 24.6 10*3/uL — ABNORMAL HIGH (ref 4.0–10.5)
nRBC: 0 % (ref 0.0–0.2)

## 2020-04-17 LAB — D-DIMER, QUANTITATIVE: D-Dimer, Quant: 9.04 ug/mL-FEU — ABNORMAL HIGH (ref 0.00–0.50)

## 2020-04-17 LAB — BRAIN NATRIURETIC PEPTIDE: B Natriuretic Peptide: 54 pg/mL (ref 0.0–100.0)

## 2020-04-17 LAB — C-REACTIVE PROTEIN: CRP: 4.6 mg/dL — ABNORMAL HIGH (ref <1.0)

## 2020-04-17 LAB — GLUCOSE, CAPILLARY: Glucose-Capillary: 137 mg/dL — ABNORMAL HIGH (ref 70–99)

## 2020-04-17 MED ORDER — SODIUM CHLORIDE 0.9 % IV SOLN
3.0000 g | Freq: Three times a day (TID) | INTRAVENOUS | Status: DC
  Administered 2020-04-17 – 2020-04-20 (×8): 3 g via INTRAVENOUS
  Filled 2020-04-17: qty 8
  Filled 2020-04-17: qty 3
  Filled 2020-04-17: qty 8
  Filled 2020-04-17 (×5): qty 3
  Filled 2020-04-17: qty 8
  Filled 2020-04-17: qty 3

## 2020-04-17 MED ORDER — FUROSEMIDE 10 MG/ML IJ SOLN
40.0000 mg | Freq: Once | INTRAMUSCULAR | Status: AC
  Administered 2020-04-17: 40 mg via INTRAVENOUS
  Filled 2020-04-17: qty 4

## 2020-04-17 NOTE — Progress Notes (Signed)
PROGRESS NOTE                                                                                                                                                                                                             Patient Demographics:    [name], is a 79 y.o. female, DOB - 1941-05-03, OVF:[id number]  Outpatient Primary MD for the patient is Cassandra Rail, MD    LOS - 8  Admit date - 03/19/2020    Chief Complaint  Patient presents with   Shortness of Breath       Brief Narrative - Cassandra Mersman Suttonis a 79 y.o.femalewith medical history significant ofhypertension, migraine headache, GERD, hyperlipidemia presents to emergency department with worsening shortness of breath.  Patient is fully vaccinated, she presents with COVID-19 symptoms, she was noted to be hypoxic, admitted for further work-up , unfortunately she kept having increased oxygen requirement. 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 No history of smoking, alcohol, licit drug use.    Subjective:   Patient in bed, appears comfortable, denies any headache, no fever, no chest pain or pressure, ++ shortness of breath , no abdominal pain. No focal weakness.    Assessment  & Plan :     1. Acute Hypoxic Resp. Failure due to Acute Covid 19 Viral Pneumonitis during the ongoing 2020 Covid 19 Pandemic - she has incurred severe parenchymal lung injury, she is severely hypoxic and being treated with combination of IV steroids, remdesivir and Baricitinib. Quite tenuous on 55 L of heated high flow along with nonrebreather. CTA ruled out PE.  Note she is fully vaccinated however she is about 6 months out of her last vaccine shot and is immunocompromised due to being on immunosuppressive medications.  We will also try IV Lasix on 04/17/2020 due to worsening hypoxia and few rails.  Encouraged the patient to sit up  in chair in the daytime use I-S and flutter valve for pulmonary toiletry and then prone in bed when at night.  Will advance activity and titrate down oxygen as possible.   SpO2: 92 % O2 Flow Rate (L/min): 55 L/min FiO2 (%): 100 %  Recent Labs  Lab 04/10/20 1526 04/10/20 1526 04/11/20 0251 04/11/20 0251 04/12/20 0109 04/13/20 0300 04/14/20 0310 04/15/20 8416 04/16/20 6063 04/16/20 0160 04/16/20 1093  WBC 19.8*   < > 16.3*   < > 15.4* 16.8* 19.4* 20.3* 21.0*  --   --   PLT 423*   < > 387   < > 410* 414* 361 395 447*  --   --   CRP 28.4*  --   --   --   --   --   --  6.5* 9.5*  --   --   BNP  --   --   --   --   --  364.1*  --   --   --  66.6  --   DDIMER  --   --  4.28*   < > 2.37* 3.26* >20.00* >20.00* 14.11*  --   --   PROCALCITON 0.64   < > 0.56  --  0.19  --  <0.10 0.12  --   --  <0.10  AST 697*   < > 348*   < > 180* 69* 46* 31 32  --   --   ALT 474*   < > 320*   < > 257* 178* 137* 89* 73*  --   --   ALKPHOS 134*   < > 104   < > 97 106 117 95 98  --   --   BILITOT 1.4*   < > 0.7   < > 0.4 0.2* 0.6 0.8 0.5  --   --   ALBUMIN 2.9*   < > 2.4*   < > 2.4* 2.5* 2.7* 2.4* 2.4*  --   --    < > = values in this interval not displayed.      2. Extremely elevated D-dimer. With negative CTA and leg ultrasound. On full dose Lovenox. This will be continued till D-dimer dips below 2 as the risk of developing a clot in the future is extremely high due to intense inflammation.  3. Asymptomatic transaminitis due to COVID-19 viral infection. Stable ultrasound showing fatty liver. Monitor trend which is improving.  4. Abdominal pain. Resolved. Likely due to constipation, stable abdominal ultrasound.  5. Essential hypertension. Currently stable off of medications.  6. GERD. On PPI.  7. Dyslipidemia. On statin.  8. HXof chronic back pain and migraine headaches. On combination of tramadol and Imitrex.    Condition - Extremely Guarded  Family Communication  :  granddaughter Cyril Mourning  [phone] on 04/16/20, 04/17/20 (patient left at 11:51 AM on 9/2/202) -  guarded prognosis & now DNR communicated.  Code Status : DNR  Consults  :  None  Procedures  :    CTA -  1. No evidence of acute pulmonary embolism. 2. Multifocal areas of mixed ground-glass and consolidative opacity with a peripheral predominance with more dense opacification in the dependent portions of the lungs likely reflecting some superimposed atelectatic changes. Findings are favored to represent multifocal pneumonia in the setting of COVID-19 positivity. 3. Trace left pleural effusion. 4. Aortic Atherosclerosis (ICD10-I70.0). 5. Coronary artery calcifications are present. Please note that the presence of coronary artery calcium documents the presence of coronary artery disease, the severity of this disease and any potential stenosis cannot be assessed on this non-gated CT examination.  Leg [place] -  RIGHT: - There is no evidence of deep vein thrombosis in the lower extremity.  - No cystic structure found in the popliteal fossa.  LEFT: - There is no evidence of deep vein thrombosis in the lower extremity.  - No cystic structure found in the popliteal fossa.  Abd [place] -  1. Negative for gallstones or biliary dilatation 2. Echogenic liver consistent with steatosis and or hepatocellular disease. 3. Simple appearing left renal cyst   PUD Prophylaxis : None  Disposition Plan  :    Status is: Inpatient  Remains inpatient appropriate because:IV treatments appropriate due to intensity of illness or inability to take PO   Dispo: The patient is from: Home              Anticipated d/c is to: Home              Anticipated d/c date is: > 3 days              Patient currently is not medically stable to d/c.   DVT Prophylaxis  :  Lovenox    Lab Results  Component Value Date   PLT 447 (H) 04/16/2020    Diet :  Diet Order            Diet heart healthy/carb modified Room service appropriate? Yes; Fluid consistency: Thin   Diet effective now                  Inpatient Medications  Scheduled Meds:  AeroChamber Plus Flo-Vu Large  1 each Other Once   albuterol  2 puff Inhalation Q6H   vitamin C  500 mg Oral Daily   atorvastatin  20 mg Oral Daily   baricitinib  4 mg Oral Daily   enoxaparin (LOVENOX) injection  70 mg Subcutaneous Q12H   feeding supplement (ENSURE ENLIVE)  237 mL Oral BID BM   gabapentin  300 mg Oral BID   hydroxychloroquine  200 mg Oral BID   methylPREDNISolone (SOLU-MEDROL) injection  40 mg Intravenous BID   pantoprazole  40 mg Oral Daily   zinc sulfate  220 mg Oral Daily   Continuous Infusions: PRN Meds:.acetaminophen, chlorpheniramine-HYDROcodone, guaiFENesin-dextromethorphan, [DISCONTINUED] ondansetron **OR** ondansetron (ZOFRAN) IV, traMADol  Antibiotics  :    Anti-infectives (From admission, onward)   Start     Dose/Rate Route Frequency Ordered Stop   04/13/20 1200  remdesivir 100 mg in sodium chloride 0.9 % 100 mL IVPB        100 mg 200 mL/hr over 30 Minutes Intravenous Daily 04/13/20 1150 04/16/20 1123   04/13/20 1100  remdesivir injection 100 mg  Status:  Discontinued        100 mg Intravenous Every 24 hours 04/13/20 0957 04/13/20 1153   04/12/20 1430  azithromycin (ZITHROMAX) tablet 500 mg        500 mg Oral Daily 04/12/20 1321 04/13/20 1218   04/10/20 1000  remdesivir 100 mg in sodium chloride 0.9 % 100 mL IVPB  Status:  Discontinued       "Followed by" Linked Group Details   100 mg 200 mL/hr over 30 Minutes Intravenous Daily 04/03/2020 1551 04/10/20 0800   03/20/2020 2200  hydroxychloroquine (PLAQUENIL) tablet 200 mg        200 mg Oral 2 times daily 03/24/2020 1653     04/14/2020 1600  remdesivir 200 mg in sodium chloride 0.9% 250 mL IVPB       "Followed by" Linked Group Details   200 mg 580 mL/hr over 30 Minutes Intravenous Once 03/31/2020 1551 03/29/2020 2139   04/10/2020 1600  azithromycin (ZITHROMAX) 500 mg in sodium chloride 0.9 % 250 mL IVPB  Status:   Discontinued        500 mg 250 mL/hr over 60 Minutes Intravenous Every 24 hours 04/02/2020 1552 04/12/20 1321  04/01/2020 1600  cefTRIAXone (ROCEPHIN) 1 g in sodium chloride 0.9 % 100 mL IVPB        1 g 200 mL/hr over 30 Minutes Intravenous Every 24 hours 03/21/2020 1552 04/13/20 1641       Time Spent in minutes  30   Lala Lund M.D on 04/17/2020 at 11:49 AM  To page go to [email or web address] - password [facility]  [facility] -  Office  [phone]     See all Orders from today for further details    Objective:   Vitals:   04/17/20 0808 04/17/20 0830 04/17/20 0912 04/17/20 1100  BP: 138/62   133/60  Pulse: 87  91   Resp: (!) 24 (!) 32 (!) 28 (!) 32  Temp:   98.1 F (36.7 C) 97.7 F (36.5 C)  TempSrc:   Oral Oral  SpO2: (!) 88% 92% 91% 92%  Weight:      Height:        Wt Readings from Last 3 Encounters:  04/10/20 73 kg  10/22/19 73.4 kg  04/16/19 73.9 kg     Intake/Output Summary (Last 24 hours) at 04/17/2020 1149 Last data filed at 04/16/2020 2120 Gross per 24 hour  Intake 120 ml  Output --  Net 120 ml     Physical Exam  Awake Alert, No new F.N deficits, Normal affect [place].AT,PERRAL Supple Neck,No JVD, No cervical lymphadenopathy appriciated.  Symmetrical Chest wall movement, Good air movement bilaterally, few rales RRR,No Gallops, Rubs or new Murmurs, No Parasternal Heave +ve B.Sounds, Abd Soft, No tenderness, No organomegaly appriciated, No rebound - guarding or rigidity. No Cyanosis, Clubbing or edema, No new Rash or bruise     Data Review:    CBC Recent Labs  Lab 04/10/20 1526 04/10/20 1526 04/11/20 0251 04/11/20 0251 04/12/20 0109 04/13/20 0300 04/14/20 0310 04/15/20 0432 04/16/20 0629  WBC 19.8*   < > 16.3*   < > 15.4* 16.8* 19.4* 20.3* 21.0*  HGB 13.3   < > 10.9*   < > 11.3* 11.5* 12.5 11.6* 11.9*  HCT 41.1   < > 33.6*   < > 35.0* 35.4* 38.9 36.1 37.8  PLT 423*   < > 387   < > 410* 414* 361 395 447*  MCV 92.2   < > 91.1   < > 93.3 91.9  90.7 91.4 92.0  MCH 29.8   < > 29.5   < > 30.1 29.9 29.1 29.4 29.0  MCHC 32.4   < > 32.4   < > 32.3 32.5 32.1 32.1 31.5  RDW 13.1   < > 13.2   < > 13.2 13.2 12.9 13.0 13.1  LYMPHSABS 0.8  --  0.9  --  1.1 0.8 0.6*  --   --   MONOABS 0.4  --  0.4  --  0.5 0.5 0.5  --   --   EOSABS 0.3  --  0.0  --  0.0 0.1 0.0  --   --   BASOSABS 0.1  --  0.0  --  0.1 0.1 0.1  --   --    < > = values in this interval not displayed.    Recent Labs  Lab 04/10/20 1526 04/10/20 1526 04/11/20 0251 04/11/20 0251 04/12/20 0109 04/13/20 0300 04/14/20 0310 04/15/20 0432 04/16/20 0629 04/16/20 0718 04/16/20 0723  NA 142   < > 142   < > 142 143 140 140 141  --   --   K 4.2   < >  3.8   < > 3.6 3.9 4.3 4.1 4.4  --   --   CL 103   < > 106   < > 107 107 102 101 103  --   --   CO2 23   < > 22   < > 24 23 25 26 24   --   --   GLUCOSE 146*   < > 160*   < > 187* 153* 139* 154* 127*  --   --   BUN 37*   < > 40*   < > 33* 20 20 25* 29*  --   --   CREATININE 0.98   < > 1.04*   < > 1.11* 0.88 0.85 0.95 0.88  --   --   CALCIUM 9.1   < > 8.5*   < > 8.5* 8.8* 8.9 8.7* 8.6*  --   --   AST 697*   < > 348*   < > 180* 69* 46* 31 32  --   --   ALT 474*   < > 320*   < > 257* 178* 137* 89* 73*  --   --   ALKPHOS 134*   < > 104   < > 97 106 117 95 98  --   --   BILITOT 1.4*   < > 0.7   < > 0.4 0.2* 0.6 0.8 0.5  --   --   ALBUMIN 2.9*   < > 2.4*   < > 2.4* 2.5* 2.7* 2.4* 2.4*  --   --   MG  --   --  2.8*  --  2.7* 2.6* 2.3  --   --   --  2.6*  CRP 28.4*  --   --   --   --   --   --  6.5* 9.5*  --   --   DDIMER  --   --  4.28*   < > 2.37* 3.26* >20.00* >20.00* 14.11*  --   --   PROCALCITON 0.64   < > 0.56  --  0.19  --  <0.10 0.12  --   --  <0.10  BNP  --   --   --   --   --  364.1*  --   --   --  66.6  --    < > = values in this interval not displayed.    ------------------------------------------------------------------------------------------------------------------ No results for input(s): CHOL, HDL, LDLCALC, TRIG,  CHOLHDL, LDLDIRECT in the last 72 hours.  Lab Results  Component Value Date   HGBA1C 5.6 10/22/2019   ------------------------------------------------------------------------------------------------------------------ No results for input(s): TSH, T4TOTAL, T3FREE, THYROIDAB in the last 72 hours.  Invalid input(s): FREET3  Cardiac Enzymes No results for input(s): CKMB, TROPONINI, MYOGLOBIN in the last 168 hours.  Invalid input(s): CK ------------------------------------------------------------------------------------------------------------------    Component Value Date/Time   BNP 66.6 04/16/2020 0718    Micro Results Recent Results (from the past 240 hour(s))  SARS Coronavirus 2 by RT PCR (hospital order, performed in [facility] hospital lab) Nasopharyngeal Nasopharyngeal Swab     Status: Abnormal   Collection Time: 03/25/2020 12:38 PM   Specimen: Nasopharyngeal Swab  Result Value Ref Range Status   SARS Coronavirus 2 POSITIVE (A) NEGATIVE Final    Comment: RESULT CALLED TO, READ BACK BY AND VERIFIED WITH: Precious Reel RN 15:30 04/11/2020 ([email or web address]) (NOTE) SARS-CoV-2 target nucleic acids are DETECTED  SARS-CoV-2 RNA is generally detectable in upper respiratory specimens  during the acute  phase of infection.  Positive results are indicative  of the presence of the identified virus, but do not rule out bacterial infection or co-infection with other pathogens not detected by the test.  Clinical correlation with patient history and  other diagnostic information is necessary to determine patient infection status.  The expected result is negative.  Fact Sheet for Patients:   StrictlyIdeas.no   Fact Sheet for Healthcare Providers:   BankingDealers.co.za    This test is not yet approved or cleared by the [place] FDA and  has been authorized for detection and/or diagnosis of SARS-CoV-2 by FDA under an Emergency Use Authorization  (EUA).  This EUA will remain in effect (meaning this  test can be used) for the duration of  the COVID-19 declaration under Section 564(b)(1) of the Act, 21 U.S.C. section 360-bbb-3(b)(1), unless the authorization is terminated or revoked sooner.  Performed at [facility], [place] [address], [place], [place] [address]   Blood Culture (routine x 2)     Status: None   Collection Time: 03/16/2020  1:02 PM   Specimen: BLOOD  Result Value Ref Range Status   Specimen Description BLOOD LEFT ANTECUBITAL  Final   Special Requests   Final    BOTTLES DRAWN AEROBIC AND ANAEROBIC Blood Culture results may not be optimal due to an inadequate volume of blood received in culture bottles   Culture   Final    NO GROWTH 5 DAYS Performed at [facility], [place] [address] [place], [place] [address]    Report Status 04/14/2020 FINAL  Final  Blood Culture (routine x 2)     Status: None   Collection Time: 03/26/2020  1:07 PM   Specimen: BLOOD  Result Value Ref Range Status   Specimen Description BLOOD RIGHT ANTECUBITAL  Final   Special Requests   Final    BOTTLES DRAWN AEROBIC ONLY Blood Culture results may not be optimal due to an inadequate volume of blood received in culture bottles   Culture   Final    NO GROWTH 5 DAYS Performed at [facility], [place] [address] [place], [place] [address]    Report Status 04/14/2020 FINAL  Final    Radiology Reports CT ANGIO CHEST PE W OR WO CONTRAST  Result Date: 04/15/2020 CLINICAL DATA:  PE suspected, COVID-19 positive EXAM: CT ANGIOGRAPHY CHEST WITH CONTRAST TECHNIQUE: Multidetector CT imaging of the chest was performed using the standard protocol during bolus administration of intravenous contrast. Multiplanar CT image reconstructions and MIPs were obtained to evaluate the vascular anatomy. CONTRAST:  60mL OMNIPAQUE IOHEXOL 350 MG/ML SOLN COMPARISON:  Radiograph 03/19/2020 FINDINGS: Cardiovascular: Satisfactory opacification the pulmonary arteries to  the segmental level. No pulmonary artery filling defects are identified. Central pulmonary arteries are normal caliber. Normal heart size. No pericardial effusion. Scattered coronary artery calcifications are present. Atherosclerotic plaque within the normal caliber aorta. No acute luminal abnormality nor periaortic stranding hemorrhage. Shared origin of the brachiocephalic and left common carotid artery. Minimal plaque in the otherwise normal proximal great vessels. Mediastinum/Nodes: Few scattered low-attenuation subcentimeter mediastinal and hilar nodes are present. No enlarged mediastinal, hilar or axillary adenopathy. No acute abnormality of the trachea or esophagus. Thyroid gland demonstrates few subcentimeter hypoattenuating foci, without significant enlargement of the gland. No further imaging follow-up is warranted in a patient of this age (Reference: J Am Coll Radiol. 2015 Feb;12(2): 143-50). Lungs/Pleura: There are multifocal areas of mixed ground-glass and consolidative opacity with a peripheral predominance with more  dense opacification in the dependent portions of the lungs likely reflecting some superimposed atelectatic changes. There is a trace left effusion as well. No pneumothorax or right effusion. Small air cyst seen in the right lung base (5/67). Upper Abdomen: No acute abnormalities present in the visualized portions of the upper abdomen. Small accessory splenule Musculoskeletal: Multilevel discogenic and facet degenerative changes, maximal C5-6 where sclerotic, likely Modic type endplate changes are present with some mild retrolisthesis. Exaggerated thoracic kyphosis is noted with a slight pectus deformity of the chest (Haller index 2.55). No acute or suspicious osseous lesions. Review of the MIP images confirms the above findings. IMPRESSION: 1. No evidence of acute pulmonary embolism. 2. Multifocal areas of mixed ground-glass and consolidative opacity with a peripheral predominance with more  dense opacification in the dependent portions of the lungs likely reflecting some superimposed atelectatic changes. Findings are favored to represent multifocal pneumonia in the setting of COVID-19 positivity. 3. Trace left pleural effusion. 4. Aortic Atherosclerosis (ICD10-I70.0). 5. Coronary artery calcifications are present. Please note that the presence of coronary artery calcium documents the presence of coronary artery disease, the severity of this disease and any potential stenosis cannot be assessed on this non-gated CT examination. Electronically Signed   By: Lovena Le M.D.   On: 04/15/2020 16:00   US Abdomen Complete  Result Date: 04/10/2020 CLINICAL DATA:  Epigastric pain EXAM: ABDOMEN ULTRASOUND COMPLETE COMPARISON:  None. FINDINGS: Gallbladder: No gallstones or wall thickening visualized. No sonographic Murphy sign noted by sonographer. Common bile duct: Diameter: 3.3 mm Liver: Liver is echogenic. No focal hepatic abnormality. Portal vein is patent on color Doppler imaging with normal direction of blood flow towards the liver. IVC: No abnormality visualized. Pancreas: Visualized portion unremarkable. Spleen: Size and appearance within normal limits. Right Kidney: Length: 10 cm. Echogenicity within normal limits. No mass or hydronephrosis visualized. Left Kidney: Length: 10.4 cm. Echogenicity within normal limits. No hydronephrosis. Cyst off the lower pole measuring 3.1 cm. Abdominal aorta: No aneurysm visualized. Other findings: None. IMPRESSION: 1. Negative for gallstones or biliary dilatation 2. Echogenic liver consistent with steatosis and or hepatocellular disease. 3. Simple appearing left renal cyst Electronically Signed   By: Donavan Foil M.D.   On: 04/10/2020 18:12   DG Chest Port 1 View  Result Date: 04/17/2020 CLINICAL DATA:  Shortness of breath, COVID positive EXAM: PORTABLE CHEST 1 VIEW COMPARISON:  04/16/2020 FINDINGS: No significant change in AP portable chest radiograph with  heterogeneous airspace opacities present in the peripheral mid lungs and lung bases. No new airspace opacity. The heart and mediastinum are unremarkable. IMPRESSION: No significant change in AP portable chest radiograph with heterogeneous airspace opacities present in the peripheral mid lungs and lung bases, in keeping with COVID airspace disease. No new airspace opacity. Electronically Signed   By: Eddie Candle M.D.   On: 04/17/2020 10:23   DG Chest Port 1 View  Result Date: 04/16/2020 CLINICAL DATA:  Shortness of breath, COVID-19 positive. EXAM: PORTABLE CHEST 1 VIEW COMPARISON:  April 09, 2020. FINDINGS: The heart size and mediastinal contours are within normal limits. No pneumothorax or pleural effusion is noted. Stable mild diffuse interstitial reticular densities are noted in the lungs bilaterally concerning for atypical inflammation. The visualized skeletal structures are unremarkable. IMPRESSION: Stable mild diffuse interstitial reticular densities are noted in the lungs bilaterally concerning for atypical inflammation. Electronically Signed   By: Marijo Conception M.D.   On: 04/16/2020 08:13   DG Chest Portable 1 View  Result Date:  04/13/2020 CLINICAL DATA:  Shortness of breath.  COVID-19 exposure EXAM: PORTABLE CHEST 1 VIEW COMPARISON:  01/13/2012 FINDINGS: The heart size and mediastinal contours are within normal limits. Atherosclerotic calcification of the aortic knob. Diffuse increased interstitial markings throughout both lungs. No pleural effusion or pneumothorax. The visualized skeletal structures are unremarkable. IMPRESSION: Diffuse increased interstitial markings throughout both lungs, which may reflect edema versus atypical/viral infection. Electronically Signed   By: Davina Poke D.O.   On: 04/13/2020 13:36   DG Abd Portable 1V  Result Date: 04/10/2020 CLINICAL DATA:  Abdominal pain. EXAM: PORTABLE ABDOMEN - 1 VIEW COMPARISON:  None. FINDINGS: The bowel gas pattern is normal. No  radio-opaque calculi or other significant radiographic abnormality are seen. IMPRESSION: Negative. Electronically Signed   By: Marijo Conception M.D.   On: 04/10/2020 15:17   VAS [place] LOWER EXTREMITY VENOUS (DVT)  Result Date: 04/15/2020  Lower Venous DVTStudy Indications: Elevated d dimer >20, aware of recent negative doppler.  Comparison Study: 04/11/20 previous Performing Technologist: Abram Sander RVS  Examination Guidelines: A complete evaluation includes B-mode imaging, spectral Doppler, color Doppler, and power Doppler as needed of all accessible portions of each vessel. Bilateral testing is considered an integral part of a complete examination. Limited examinations for reoccurring indications may be performed as noted. The reflux portion of the exam is performed with the patient in reverse Trendelenburg.  +---------+---------------+---------+-----------+----------+--------------+  RIGHT     Compressibility Phasicity Spontaneity Properties Thrombus Aging  +---------+---------------+---------+-----------+----------+--------------+  CFV       Full            Yes       Yes                                    +---------+---------------+---------+-----------+----------+--------------+  SFJ       Full                                                             +---------+---------------+---------+-----------+----------+--------------+  FV Prox   Full                                                             +---------+---------------+---------+-----------+----------+--------------+  FV Mid    Full                                                             +---------+---------------+---------+-----------+----------+--------------+  FV Distal Full                                                             +---------+---------------+---------+-----------+----------+--------------+  PFV       Full                                                              +---------+---------------+---------+-----------+----------+--------------+  POP       Full            Yes       Yes                                    +---------+---------------+---------+-----------+----------+--------------+  PTV       Full                                                             +---------+---------------+---------+-----------+----------+--------------+  PERO      Full                                                             +---------+---------------+---------+-----------+----------+--------------+   +---------+---------------+---------+-----------+----------+--------------+  LEFT      Compressibility Phasicity Spontaneity Properties Thrombus Aging  +---------+---------------+---------+-----------+----------+--------------+  CFV       Full            Yes       Yes                                    +---------+---------------+---------+-----------+----------+--------------+  SFJ       Full                                                             +---------+---------------+---------+-----------+----------+--------------+  FV Prox   Full                                                             +---------+---------------+---------+-----------+----------+--------------+  FV Mid    Full                                                             +---------+---------------+---------+-----------+----------+--------------+  FV Distal Full                                                             +---------+---------------+---------+-----------+----------+--------------+  PFV       Full                                                             +---------+---------------+---------+-----------+----------+--------------+  POP       Full            Yes       Yes                                    +---------+---------------+---------+-----------+----------+--------------+  PTV       Full                                                              +---------+---------------+---------+-----------+----------+--------------+  PERO      Full                                                             +---------+---------------+---------+-----------+----------+--------------+     Summary: BILATERAL: - No evidence of deep vein thrombosis seen in the lower extremities, bilaterally. - No evidence of superficial venous thrombosis in the lower extremities, bilaterally. -   *See table(s) above for measurements and observations. Electronically signed by Harold Barban MD on 04/15/2020 at 8:50:57 PM.    Final    VAS [place] LOWER EXTREMITY VENOUS (DVT)  Result Date: 04/11/2020  Lower Venous DVTStudy Indications: Elevated Ddimer.  Risk Factors: COVID 19 positive. Comparison Study: No prior studies. Performing Technologist: Oliver Hum RVT  Examination Guidelines: A complete evaluation includes B-mode imaging, spectral Doppler, color Doppler, and power Doppler as needed of all accessible portions of each vessel. Bilateral testing is considered an integral part of a complete examination. Limited examinations for reoccurring indications may be performed as noted. The reflux portion of the exam is performed with the patient in reverse Trendelenburg.  +---------+---------------+---------+-----------+----------+--------------+  RIGHT     Compressibility Phasicity Spontaneity Properties Thrombus Aging  +---------+---------------+---------+-----------+----------+--------------+  CFV       Full            Yes       Yes                                    +---------+---------------+---------+-----------+----------+--------------+  SFJ       Full                                                             +---------+---------------+---------+-----------+----------+--------------+  FV Prox   Full                                                             +---------+---------------+---------+-----------+----------+--------------+  FV Mid    Full                                                              +---------+---------------+---------+-----------+----------+--------------+  FV Distal Full                                                             +---------+---------------+---------+-----------+----------+--------------+  PFV       Full                                                             +---------+---------------+---------+-----------+----------+--------------+  POP       Full            Yes       Yes                                    +---------+---------------+---------+-----------+----------+--------------+  PTV       Full                                                             +---------+---------------+---------+-----------+----------+--------------+  PERO      Full                                                             +---------+---------------+---------+-----------+----------+--------------+   +---------+---------------+---------+-----------+----------+--------------+  LEFT      Compressibility Phasicity Spontaneity Properties Thrombus Aging  +---------+---------------+---------+-----------+----------+--------------+  CFV       Full            Yes       Yes                                    +---------+---------------+---------+-----------+----------+--------------+  SFJ       Full                                                             +---------+---------------+---------+-----------+----------+--------------+  FV Prox   Full                                                             +---------+---------------+---------+-----------+----------+--------------+  FV Mid    Full                                                             +---------+---------------+---------+-----------+----------+--------------+  FV Distal Full                                                             +---------+---------------+---------+-----------+----------+--------------+  PFV       Full                                                              +---------+---------------+---------+-----------+----------+--------------+  POP       Full            Yes       Yes                                    +---------+---------------+---------+-----------+----------+--------------+  PTV       Full                                                             +---------+---------------+---------+-----------+----------+--------------+  PERO      Full                                                             +---------+---------------+---------+-----------+----------+--------------+     Summary: RIGHT: - There is no evidence of deep vein thrombosis in the lower extremity.  - No cystic structure found in the popliteal fossa.  LEFT: - There is no evidence of deep vein thrombosis in the lower extremity.  - No cystic structure found in the popliteal fossa.  *See table(s) above for measurements and observations. Electronically signed by Servando Snare MD on 04/11/2020 at 4:56:04 PM.    Final

## 2020-04-17 NOTE — Progress Notes (Signed)
Pharmacy Antibiotic Note  [name] is a 79 y.o. female admitted on 03/21/2020 with pneumonia.  Pharmacy has been consulted for Unasyn dosing.  Pt has been here for COVID. She has completed her remdesivir. Unasyn has been ordered to empirically cover for asp PNA.   CrCl~50 ml/min  Plan: Unasyn 3g IV q8  Height: 5\' 4"  (162.6 cm) Weight: 73 kg (160 lb 15 oz) IBW/kg (Calculated) : 54.7  Temp (24hrs), Avg:98.7 F (37.1 C), Min:97.7 F (36.5 C), Max:100 F (37.8 C)  Recent Labs  Lab 04/13/20 0300 04/14/20 0310 04/15/20 0432 04/16/20 0629 04/17/20 1336  WBC 16.8* 19.4* 20.3* 21.0* 24.6*  CREATININE 0.88 0.85 0.95 0.88 0.92    Estimated Creatinine Clearance: 49.3 mL/min (by C-G formula based on SCr of 0.92 mg/dL).    No Known Allergies  Antimicrobials this admission: 8/25 azith>>8/29 8/25 ceftriaxone>>8/29 9/2 unasyn>> Dose adjustments this admission:   Microbiology results: 8/25 blood>>negF   [name], PharmD, BCIDP, AAHIVP, CPP Infectious Disease Pharmacist 04/17/2020 3:32 PM

## 2020-04-17 NOTE — Progress Notes (Signed)
   04/17/20 0808 04/17/20 0830  Assess: MEWS Score  BP 138/62  --   Pulse Rate 87  --   ECG Heart Rate  --  96  Resp (!) 24 (!) 32  SpO2 (!) 88 % 92 %  O2 Device HFNC;Non-rebreather Mask  --   Heater temperature 93.6 F (34.2 C)  --   O2 Flow Rate (L/min) 50 L/min (Dec. to 50L tolerating well at this time.)  --   FiO2 (%) 100 %  --   Assess: MEWS Score  MEWS Temp 0 0  MEWS Systolic 0 0  MEWS Pulse 0 0  MEWS RR 1 2  MEWS LOC 0 0  MEWS Score 1 2  MEWS Score Color Green Yellow  Assess: if the MEWS score is Yellow or Red  Were vital signs taken at a resting state?  --  Yes  Focused Assessment  --  No change from prior assessment  Early Detection of Sepsis Score *See Row Information*  --  Low  MEWS guidelines implemented *See Row Information*  --  Yes  Treat  MEWS Interventions  --  Administered scheduled meds/treatments  Pain Scale  --  0-10  Pain Score  --  0  Take Vital Signs  Increase Vital Sign Frequency   --  Yellow: Q 2hr X 2 then Q 4hr X 2, if remains yellow, continue Q 4hrs  Escalate  MEWS: Escalate  --  Yellow: discuss with charge nurse/RN and consider discussing with provider and RRT  Notify: Provider  Provider Name/Title  --  Dr. Candiss Norse  Date Provider Notified  --  04/17/20  Time Provider Notified  --  0830  Notification Type  --  Page  Notification Reason  --  Change in status  Response  --  See new orders  Date of Provider Response  --  04/17/20  Time of Provider Response  --  0840  Document  Patient Outcome  --  Stabilized after interventions  Progress note created (see row info)  --  Yes  Patient with desaturations while trying to eat breakfast. Oxygen equipment (NRB, Heated high flow) checked and patient repositioned.  Assessed by RN, patient alert, oriented and conversant, lungs diminished. Reviewed plan of care with MD (at bedside) and patient.  Assisted patient with tray set up and managing oral meds/bites of food with NRB mask on.  RR remains elevated, MD  aware.

## 2020-04-17 NOTE — Plan of Care (Signed)
  Problem: Education: Goal: Knowledge of General Education information will improve Description: Including pain rating scale, medication(s)/side effects and non-pharmacologic comfort measures Outcome: Progressing  Provided emotional support and education to patient and family about choice to be a DNR and how this impacts care.  Problem: Clinical Measurements: Goal: Will remain free from infection Outcome: Progressing  Patient afebrile this shift.

## 2020-04-18 ENCOUNTER — Encounter (HOSPITAL_COMMUNITY): Payer: Self-pay | Admitting: Internal Medicine

## 2020-04-18 LAB — COMPREHENSIVE METABOLIC PANEL
ALT: 66 U/L — ABNORMAL HIGH (ref 0–44)
AST: 37 U/L (ref 15–41)
Albumin: 2.7 g/dL — ABNORMAL LOW (ref 3.5–5.0)
Alkaline Phosphatase: 133 U/L — ABNORMAL HIGH (ref 38–126)
Anion gap: 13 (ref 5–15)
BUN: 33 mg/dL — ABNORMAL HIGH (ref 8–23)
CO2: 25 mmol/L (ref 22–32)
Calcium: 9 mg/dL (ref 8.9–10.3)
Chloride: 102 mmol/L (ref 98–111)
Creatinine, Ser: 1.08 mg/dL — ABNORMAL HIGH (ref 0.44–1.00)
GFR calc Af Amer: 57 mL/min — ABNORMAL LOW (ref >60)
GFR calc non Af Amer: 49 mL/min — ABNORMAL LOW (ref >60)
Glucose, Bld: 115 mg/dL — ABNORMAL HIGH (ref 70–99)
Potassium: 4.5 mmol/L (ref 3.5–5.1)
Sodium: 140 mmol/L (ref 135–145)
Total Bilirubin: 0.6 mg/dL (ref 0.3–1.2)
Total Protein: 6.9 g/dL (ref 6.5–8.1)

## 2020-04-18 LAB — CBC
HCT: 41.6 % (ref 36.0–46.0)
Hemoglobin: 13.3 g/dL (ref 12.0–15.0)
MCH: 30 pg (ref 26.0–34.0)
MCHC: 32 g/dL (ref 30.0–36.0)
MCV: 93.7 fL (ref 80.0–100.0)
Platelets: 653 10*3/uL — ABNORMAL HIGH (ref 150–400)
RBC: 4.44 MIL/uL (ref 3.87–5.11)
RDW: 13.2 % (ref 11.5–15.5)
WBC: 31.2 10*3/uL — ABNORMAL HIGH (ref 4.0–10.5)
nRBC: 0 % (ref 0.0–0.2)

## 2020-04-18 LAB — MRSA PCR SCREENING: MRSA by PCR: NEGATIVE

## 2020-04-18 LAB — GLUCOSE, CAPILLARY
Glucose-Capillary: 96 mg/dL (ref 70–99)
Glucose-Capillary: 127 mg/dL — ABNORMAL HIGH (ref 70–99)
Glucose-Capillary: 228 mg/dL — ABNORMAL HIGH (ref 70–99)

## 2020-04-18 LAB — PROCALCITONIN: Procalcitonin: <0.1 ng/mL

## 2020-04-18 LAB — D-DIMER, QUANTITATIVE: D-Dimer, Quant: 6.29 ug/mL-FEU — ABNORMAL HIGH (ref 0.00–0.50)

## 2020-04-18 LAB — MAGNESIUM: Magnesium: 2.7 mg/dL — ABNORMAL HIGH (ref 1.7–2.4)

## 2020-04-18 LAB — BRAIN NATRIURETIC PEPTIDE: B Natriuretic Peptide: 48.2 pg/mL (ref 0.0–100.0)

## 2020-04-18 MED ORDER — FUROSEMIDE 10 MG/ML IJ SOLN
40.0000 mg | Freq: Once | INTRAMUSCULAR | Status: AC
  Administered 2020-04-18: 40 mg via INTRAVENOUS
  Filled 2020-04-18: qty 4

## 2020-04-18 NOTE — Progress Notes (Signed)
Staff observed patient desaturations, 79-85% on 55 L.  Checked patient, lungs clear and diminished on auscultation. patient alert, oriented and conversant.  Assisted patient to change position several times with no improvement in .  Turned heated high flow up to 60 with immediate improvement, O2 sats up to 96%. Repositioned patient again, sats remaining elevated.  MD notified.

## 2020-04-18 NOTE — Progress Notes (Signed)
   04/18/20 1000  Assess: MEWS Score  Temp 99.1 F (37.3 C)  BP 122/64  Pulse Rate 85  ECG Heart Rate 89  Resp (!) 28  Level of Consciousness Alert  SpO2 92 %  O2 Device Non-rebreather Mask;Other (Comment) (heated high flow)  Patient Activity (if Appropriate) In bed  O2 Flow Rate (L/min) 55 L/min  FiO2 (%) 100 %  Assess: MEWS Score  MEWS Temp 0  MEWS Systolic 0  MEWS Pulse 0  MEWS RR 2  MEWS LOC 0  MEWS Score 2  MEWS Score Color Yellow  Assess: if the MEWS score is Yellow or Red  Were vital signs taken at a resting state? Yes  Focused Assessment No change from prior assessment  Early Detection of Sepsis Score *See Row Information* Low  MEWS guidelines implemented *See Row Information* Yes  Treat  MEWS Interventions Administered scheduled meds/treatments;Other (Comment) (prone)  Pain Scale 0-10  Pain Score 5  Pain Location Generalized  Pain Intervention(s) Medication (See eMAR)  Patients response to intervention Relief  Take Vital Signs  Increase Vital Sign Frequency  Yellow: Q 2hr X 2 then Q 4hr X 2, if remains yellow, continue Q 4hrs  Escalate  MEWS: Escalate Yellow: discuss with charge nurse/RN and consider discussing with provider and RRT  Document  Patient Outcome Stabilized after interventions  Progress note created (see row info) Yes

## 2020-04-18 NOTE — Progress Notes (Signed)
PROGRESS NOTE                                                                                                                                                                                                             Patient Demographics:    [name], is a 79 y.o. female, DOB - Dec 30, 1940, YOV:[id number]  Outpatient Primary MD for the patient is [name], MD    LOS - 9  Admit date - 03/23/2020    Chief Complaint  Patient presents with   Shortness of Breath       Brief Narrative - Cassandra Mcginniss Suttonis a 79 y.o.femalewith medical history significant ofhypertension, migraine headache, GERD, hyperlipidemia presents to emergency department with worsening shortness of breath.  Patient is fully vaccinated, she presents with COVID-19 symptoms, she was noted to be hypoxic, admitted for further work-up , unfortunately she kept having increased oxygen requirement. 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 No history of smoking, alcohol, licit drug use.    Subjective:   Patient in bed, appears comfortable, denies any headache, no fever, no chest pain or pressure, ++ shortness of breath , no abdominal pain. No focal weakness.    Assessment  & Plan :     1. Acute Hypoxic Resp. Failure due to Acute Covid 19 Viral Pneumonitis during the ongoing 2020 Covid 19 Pandemic - she has incurred severe parenchymal lung injury, she is severely hypoxic and being treated with combination of IV steroids, remdesivir and Baricitinib. Quite tenuous on 55 L of heated high flow along with nonrebreather. CTA ruled out PE.  Note she is fully vaccinated however she is about 6 months out of her last vaccine shot and is immunocompromised due to being on immunosuppressive medications.  We will also try IV Lasix on 04/17/2020 due to worsening hypoxia and few rails, also due to rising leukocytosis and  risk of aspiration she was started on Unasyn on 04/17/2020 which will be continued, speech is also following and she is on a soft diet.  Encouraged the patient to sit up in chair in the daytime use I-S and flutter valve for pulmonary toiletry and then prone in bed when at night.  Will advance activity and titrate down oxygen as possible.   SpO2: 92 % O2 Flow Rate (L/min): 55 L/min FiO2 (%):  100 %  Recent Labs  Lab 04/12/20 0109 04/12/20 0109 04/13/20 0300 04/14/20 0310 04/15/20 0432 04/16/20 0629 04/16/20 0718 04/16/20 0723 04/17/20 1336 04/17/20 1337  WBC 15.4*   < > 16.8* 19.4* 20.3* 21.0*  --   --  24.6*  --   PLT 410*   < > 414* 361 395 447*  --   --  528*  --   CRP  --   --   --   --  6.5* 9.5*  --   --  4.6*  --   BNP  --   --  364.1*  --   --   --  66.6  --   --  54.0  DDIMER 2.37*   < > 3.26* >20.00* >20.00* 14.11*  --   --  9.04*  --   PROCALCITON 0.19  --   --  <0.10 0.12  --   --  <0.10 <0.10  --   AST 180*   < > 69* 46* 31 32  --   --  39  --   ALT 257*   < > 178* 137* 89* 73*  --   --  75*  --   ALKPHOS 97   < > 106 117 95 98  --   --  113  --   BILITOT 0.4   < > 0.2* 0.6 0.8 0.5  --   --  1.0  --   ALBUMIN 2.4*   < > 2.5* 2.7* 2.4* 2.4*  --   --  2.6*  --    < > = values in this interval not displayed.      2. Extremely elevated D-dimer. With negative CTA and leg ultrasound. On full dose Lovenox. This will be continued till D-dimer dips below 2 as the risk of developing a clot in the future is extremely high due to intense inflammation.  3. Asymptomatic transaminitis due to COVID-19 viral infection. Stable ultrasound showing fatty liver. Monitor trend which is improving.  4. Abdominal pain. Resolved. Likely due to constipation, stable abdominal ultrasound.  5. Essential hypertension. Currently stable off of medications.  6. GERD. On PPI.  7. Dyslipidemia. On statin.  8. HX of chronic back pain and migraine headaches. On combination of tramadol and  Imitrex.    Condition - Extremely Guarded  Family Communication  :  granddaughter Cyril Mourning [phone] on 04/16/20, 04/17/20 (patient left at 11:51 AM on 9/2/202) -  guarded prognosis & now DNR communicated.  Code Status : DNR  Consults  :  None  Procedures  :    CTA -  1. No evidence of acute pulmonary embolism. 2. Multifocal areas of mixed ground-glass and consolidative opacity with a peripheral predominance with more dense opacification in the dependent portions of the lungs likely reflecting some superimposed atelectatic changes. Findings are favored to represent multifocal pneumonia in the setting of COVID-19 positivity. 3. Trace left pleural effusion. 4. Aortic Atherosclerosis (ICD10-I70.0). 5. Coronary artery calcifications are present. Please note that the presence of coronary artery calcium documents the presence of coronary artery disease, the severity of this disease and any potential stenosis cannot be assessed on this non-gated CT examination.  Leg [place] -  RIGHT: - There is no evidence of deep vein thrombosis in the lower extremity.  - No cystic structure found in the popliteal fossa.  LEFT: - There is no evidence of deep vein thrombosis in the lower extremity.  - No cystic structure found in the popliteal fossa.  Abd [place] - 1. Negative for gallstones or biliary dilatation 2. Echogenic liver consistent with steatosis and or hepatocellular disease. 3. Simple appearing left renal cyst   PUD Prophylaxis : None  Disposition Plan  :    Status is: Inpatient  Remains inpatient appropriate because:IV treatments appropriate due to intensity of illness or inability to take PO   Dispo: The patient is from: Home              Anticipated d/c is to: Home              Anticipated d/c date is: > 3 days              Patient currently is not medically stable to d/c.   DVT Prophylaxis  :  Lovenox    Lab Results  Component Value Date   PLT 528 (H) 04/17/2020    Diet :  Diet Order             DIET SOFT Room service appropriate? Yes; Fluid consistency: Thin  Diet effective now                  Inpatient Medications  Scheduled Meds:  AeroChamber Plus Flo-Vu Large  1 each Other Once   albuterol  2 puff Inhalation Q6H   vitamin C  500 mg Oral Daily   atorvastatin  20 mg Oral Daily   baricitinib  4 mg Oral Daily   enoxaparin (LOVENOX) injection  70 mg Subcutaneous Q12H   feeding supplement (ENSURE ENLIVE)  237 mL Oral BID BM   furosemide  40 mg Intravenous Once   gabapentin  300 mg Oral BID   hydroxychloroquine  200 mg Oral BID   methylPREDNISolone (SOLU-MEDROL) injection  40 mg Intravenous BID   pantoprazole  40 mg Oral Daily   zinc sulfate  220 mg Oral Daily   Continuous Infusions:  ampicillin-sulbactam (UNASYN) IV 3 g (04/18/20 0909)   PRN Meds:.acetaminophen, chlorpheniramine-HYDROcodone, guaiFENesin-dextromethorphan, [DISCONTINUED] ondansetron **OR** ondansetron (ZOFRAN) IV, traMADol  Antibiotics  :    Anti-infectives (From admission, onward)   Start     Dose/Rate Route Frequency Ordered Stop   04/17/20 1630  Ampicillin-Sulbactam (UNASYN) 3 g in sodium chloride 0.9 % 100 mL IVPB        3 g 200 mL/hr over 30 Minutes Intravenous Every 8 hours 04/17/20 1526     04/13/20 1200  remdesivir 100 mg in sodium chloride 0.9 % 100 mL IVPB        100 mg 200 mL/hr over 30 Minutes Intravenous Daily 04/13/20 1150 04/16/20 1123   04/13/20 1100  remdesivir injection 100 mg  Status:  Discontinued        100 mg Intravenous Every 24 hours 04/13/20 0957 04/13/20 1153   04/12/20 1430  azithromycin (ZITHROMAX) tablet 500 mg        500 mg Oral Daily 04/12/20 1321 04/13/20 1218   04/10/20 1000  remdesivir 100 mg in sodium chloride 0.9 % 100 mL IVPB  Status:  Discontinued       "Followed by" Linked Group Details   100 mg 200 mL/hr over 30 Minutes Intravenous Daily 04/07/2020 1551 04/10/20 0800   04/14/2020 2200  hydroxychloroquine (PLAQUENIL) tablet 200 mg        200  mg Oral 2 times daily 03/19/2020 1653     04/07/2020 1600  remdesivir 200 mg in sodium chloride 0.9% 250 mL IVPB       "Followed by" Linked Group Details  200 mg 580 mL/hr over 30 Minutes Intravenous Once 04/08/2020 1551 04/10/2020 2139   04/13/2020 1600  azithromycin (ZITHROMAX) 500 mg in sodium chloride 0.9 % 250 mL IVPB  Status:  Discontinued        500 mg 250 mL/hr over 60 Minutes Intravenous Every 24 hours 04/15/2020 1552 04/12/20 1321   03/28/2020 1600  cefTRIAXone (ROCEPHIN) 1 g in sodium chloride 0.9 % 100 mL IVPB        1 g 200 mL/hr over 30 Minutes Intravenous Every 24 hours 04/06/2020 1552 04/13/20 1641       Time Spent in minutes  30   Lala Lund M.D on 04/18/2020 at 11:38 AM  To page go to [email or web address] - password [facility]  [facility] -  Office  [phone]     See all Orders from today for further details    Objective:   Vitals:   04/18/20 0800 04/18/20 0810 04/18/20 0818 04/18/20 1000  BP:  136/63  122/64  Pulse:    85  Resp: (!) 24   (!) 28  Temp:  98.1 F (36.7 C)  99.1 F (37.3 C)  TempSrc:  Oral    SpO2: 94% 92% 95% 92%  Weight:      Height:        Wt Readings from Last 3 Encounters:  04/10/20 73 kg  10/22/19 73.4 kg  04/16/19 73.9 kg     Intake/Output Summary (Last 24 hours) at 04/18/2020 1138 Last data filed at 04/18/2020 0524 Gross per 24 hour  Intake 440 ml  Output --  Net 440 ml     Physical Exam  Awake Alert, No new F.N deficits, Normal affect [place].AT,PERRAL Supple Neck,No JVD, No cervical lymphadenopathy appriciated.  Symmetrical Chest wall movement, Good air movement bilaterally, bibasilar Rales RRR,No Gallops, Rubs or new Murmurs, No Parasternal Heave +ve B.Sounds, Abd Soft, No tenderness, No organomegaly appriciated, No rebound - guarding or rigidity. No Cyanosis, Clubbing or edema, No new Rash or bruise     Data Review:    CBC Recent Labs  Lab 04/12/20 0109 04/12/20 0109 04/13/20 0300 04/14/20 0310 04/15/20 0432  04/16/20 0629 04/17/20 1336  WBC 15.4*   < > 16.8* 19.4* 20.3* 21.0* 24.6*  HGB 11.3*   < > 11.5* 12.5 11.6* 11.9* 12.6  HCT 35.0*   < > 35.4* 38.9 36.1 37.8 39.4  PLT 410*   < > 414* 361 395 447* 528*  MCV 93.3   < > 91.9 90.7 91.4 92.0 93.6  MCH 30.1   < > 29.9 29.1 29.4 29.0 29.9  MCHC 32.3   < > 32.5 32.1 32.1 31.5 32.0  RDW 13.2   < > 13.2 12.9 13.0 13.1 13.2  LYMPHSABS 1.1  --  0.8 0.6*  --   --   --   MONOABS 0.5  --  0.5 0.5  --   --   --   EOSABS 0.0  --  0.1 0.0  --   --   --   BASOSABS 0.1  --  0.1 0.1  --   --   --    < > = values in this interval not displayed.    Recent Labs  Lab 04/12/20 0109 04/12/20 0109 04/13/20 0300 04/14/20 0310 04/15/20 0432 04/16/20 0629 04/16/20 0718 04/16/20 0723 04/17/20 1336 04/17/20 1337  NA 142   < > 143 140 140 141  --   --  141  --   K 3.6   < >  3.9 4.3 4.1 4.4  --   --  4.4  --   CL 107   < > 107 102 101 103  --   --  104  --   CO2 24   < > 23 25 26 24   --   --  22  --   GLUCOSE 187*   < > 153* 139* 154* 127*  --   --  124*  --   BUN 33*   < > 20 20 25* 29*  --   --  27*  --   CREATININE 1.11*   < > 0.88 0.85 0.95 0.88  --   --  0.92  --   CALCIUM 8.5*   < > 8.8* 8.9 8.7* 8.6*  --   --  8.6*  --   AST 180*   < > 69* 46* 31 32  --   --  39  --   ALT 257*   < > 178* 137* 89* 73*  --   --  75*  --   ALKPHOS 97   < > 106 117 95 98  --   --  113  --   BILITOT 0.4   < > 0.2* 0.6 0.8 0.5  --   --  1.0  --   ALBUMIN 2.4*   < > 2.5* 2.7* 2.4* 2.4*  --   --  2.6*  --   MG 2.7*  --  2.6* 2.3  --   --   --  2.6* 2.5*  --   CRP  --   --   --   --  6.5* 9.5*  --   --  4.6*  --   DDIMER 2.37*   < > 3.26* >20.00* >20.00* 14.11*  --   --  9.04*  --   PROCALCITON 0.19  --   --  <0.10 0.12  --   --  <0.10 <0.10  --   BNP  --   --  364.1*  --   --   --  66.6  --   --  54.0   < > = values in this interval not displayed.    ------------------------------------------------------------------------------------------------------------------ No  results for input(s): CHOL, HDL, LDLCALC, TRIG, CHOLHDL, LDLDIRECT in the last 72 hours.  Lab Results  Component Value Date   HGBA1C 5.6 10/22/2019   ------------------------------------------------------------------------------------------------------------------ No results for input(s): TSH, T4TOTAL, T3FREE, THYROIDAB in the last 72 hours.  Invalid input(s): FREET3  Cardiac Enzymes No results for input(s): CKMB, TROPONINI, MYOGLOBIN in the last 168 hours.  Invalid input(s): CK ------------------------------------------------------------------------------------------------------------------    Component Value Date/Time   BNP 54.0 04/17/2020 1337    Micro Results Recent Results (from the past 240 hour(s))  SARS Coronavirus 2 by RT PCR (hospital order, performed in [facility] hospital lab) Nasopharyngeal Nasopharyngeal Swab     Status: Abnormal   Collection Time: 03/16/2020 12:38 PM   Specimen: Nasopharyngeal Swab  Result Value Ref Range Status   SARS Coronavirus 2 POSITIVE (A) NEGATIVE Final    Comment: RESULT CALLED TO, READ BACK BY AND VERIFIED WITH: Precious Reel RN 15:30 04/14/2020 ([email or web address]) (NOTE) SARS-CoV-2 target nucleic acids are DETECTED  SARS-CoV-2 RNA is generally detectable in upper respiratory specimens  during the acute phase of infection.  Positive results are indicative  of the presence of the identified virus, but do not rule out bacterial infection or co-infection with other pathogens not detected by the test.  Clinical correlation with patient history and  other diagnostic information is necessary to determine patient infection status.  The expected result is negative.  Fact Sheet for Patients:   StrictlyIdeas.no   Fact Sheet for Healthcare Providers:   BankingDealers.co.za    This test is not yet approved or cleared by the [place] FDA and  has been authorized for detection and/or diagnosis of SARS-CoV-2  by FDA under an Emergency Use Authorization (EUA).  This EUA will remain in effect (meaning this  test can be used) for the duration of  the COVID-19 declaration under Section 564(b)(1) of the Act, 21 U.S.C. section 360-bbb-3(b)(1), unless the authorization is terminated or revoked sooner.  Performed at [facility], [place] [address] [place], [place] [address]   Blood Culture (routine x 2)     Status: None   Collection Time: 03/20/2020  1:02 PM   Specimen: BLOOD  Result Value Ref Range Status   Specimen Description BLOOD LEFT ANTECUBITAL  Final   Special Requests   Final    BOTTLES DRAWN AEROBIC AND ANAEROBIC Blood Culture results may not be optimal due to an inadequate volume of blood received in culture bottles   Culture   Final    NO GROWTH 5 DAYS Performed at [facility], [place] [address], [place], [place] [address]    Report Status 04/14/2020 FINAL  Final  Blood Culture (routine x 2)     Status: None   Collection Time: 04/13/2020  1:07 PM   Specimen: BLOOD  Result Value Ref Range Status   Specimen Description BLOOD RIGHT ANTECUBITAL  Final   Special Requests   Final    BOTTLES DRAWN AEROBIC ONLY Blood Culture results may not be optimal due to an inadequate volume of blood received in culture bottles   Culture   Final    NO GROWTH 5 DAYS Performed at [facility], [place] [address] [place], [place] [address]    Report Status 04/14/2020 FINAL  Final  MRSA PCR Screening     Status: None   Collection Time: 04/17/20  3:14 PM   Specimen: Nasal Mucosa; Nasopharyngeal  Result Value Ref Range Status   MRSA by PCR NEGATIVE NEGATIVE Final    Comment:        The GeneXpert MRSA Assay (FDA approved for NASAL specimens only), is one component of a comprehensive MRSA colonization surveillance program. It is not intended to diagnose MRSA infection nor to guide or monitor treatment for MRSA infections. Performed at [facility], [place] [address] [place],   [address]     Radiology Reports CT ANGIO CHEST PE W OR WO CONTRAST  Result Date: 04/15/2020 CLINICAL DATA:  PE suspected, COVID-19 positive EXAM: CT ANGIOGRAPHY CHEST WITH CONTRAST TECHNIQUE: Multidetector CT imaging of the chest was performed using the standard protocol during bolus administration of intravenous contrast. Multiplanar CT image reconstructions and MIPs were obtained to evaluate the vascular anatomy. CONTRAST:  77mL OMNIPAQUE IOHEXOL 350 MG/ML SOLN COMPARISON:  Radiograph 04/01/2020 FINDINGS: Cardiovascular: Satisfactory opacification the pulmonary arteries to the segmental level. No pulmonary artery filling defects are identified. Central pulmonary arteries are normal caliber. Normal heart size. No pericardial effusion. Scattered coronary artery calcifications are present. Atherosclerotic plaque within the normal caliber aorta. No acute luminal abnormality nor periaortic stranding hemorrhage. Shared origin of the brachiocephalic and left common carotid artery. Minimal plaque in the otherwise normal proximal great vessels. Mediastinum/Nodes: Few scattered low-attenuation subcentimeter mediastinal and hilar nodes are present. No enlarged mediastinal, hilar or axillary  adenopathy. No acute abnormality of the trachea or esophagus. Thyroid gland demonstrates few subcentimeter hypoattenuating foci, without significant enlargement of the gland. No further imaging follow-up is warranted in a patient of this age (Reference: J Am Coll Radiol. 2015 Feb;12(2): 143-50). Lungs/Pleura: There are multifocal areas of mixed ground-glass and consolidative opacity with a peripheral predominance with more dense opacification in the dependent portions of the lungs likely reflecting some superimposed atelectatic changes. There is a trace left effusion as well. No pneumothorax or right effusion. Small air cyst seen in the right lung base (5/67). Upper Abdomen: No acute abnormalities present in the visualized portions of  the upper abdomen. Small accessory splenule Musculoskeletal: Multilevel discogenic and facet degenerative changes, maximal C5-6 where sclerotic, likely Modic type endplate changes are present with some mild retrolisthesis. Exaggerated thoracic kyphosis is noted with a slight pectus deformity of the chest (Haller index 2.55). No acute or suspicious osseous lesions. Review of the MIP images confirms the above findings. IMPRESSION: 1. No evidence of acute pulmonary embolism. 2. Multifocal areas of mixed ground-glass and consolidative opacity with a peripheral predominance with more dense opacification in the dependent portions of the lungs likely reflecting some superimposed atelectatic changes. Findings are favored to represent multifocal pneumonia in the setting of COVID-19 positivity. 3. Trace left pleural effusion. 4. Aortic Atherosclerosis (ICD10-I70.0). 5. Coronary artery calcifications are present. Please note that the presence of coronary artery calcium documents the presence of coronary artery disease, the severity of this disease and any potential stenosis cannot be assessed on this non-gated CT examination. Electronically Signed   By: Lovena Le M.D.   On: 04/15/2020 16:00   US Abdomen Complete  Result Date: 04/10/2020 CLINICAL DATA:  Epigastric pain EXAM: ABDOMEN ULTRASOUND COMPLETE COMPARISON:  None. FINDINGS: Gallbladder: No gallstones or wall thickening visualized. No sonographic Murphy sign noted by sonographer. Common bile duct: Diameter: 3.3 mm Liver: Liver is echogenic. No focal hepatic abnormality. Portal vein is patent on color Doppler imaging with normal direction of blood flow towards the liver. IVC: No abnormality visualized. Pancreas: Visualized portion unremarkable. Spleen: Size and appearance within normal limits. Right Kidney: Length: 10 cm. Echogenicity within normal limits. No mass or hydronephrosis visualized. Left Kidney: Length: 10.4 cm. Echogenicity within normal limits. No  hydronephrosis. Cyst off the lower pole measuring 3.1 cm. Abdominal aorta: No aneurysm visualized. Other findings: None. IMPRESSION: 1. Negative for gallstones or biliary dilatation 2. Echogenic liver consistent with steatosis and or hepatocellular disease. 3. Simple appearing left renal cyst Electronically Signed   By: Donavan Foil M.D.   On: 04/10/2020 18:12   DG Chest Port 1 View  Result Date: 04/17/2020 CLINICAL DATA:  Shortness of breath, COVID positive EXAM: PORTABLE CHEST 1 VIEW COMPARISON:  04/16/2020 FINDINGS: No significant change in AP portable chest radiograph with heterogeneous airspace opacities present in the peripheral mid lungs and lung bases. No new airspace opacity. The heart and mediastinum are unremarkable. IMPRESSION: No significant change in AP portable chest radiograph with heterogeneous airspace opacities present in the peripheral mid lungs and lung bases, in keeping with COVID airspace disease. No new airspace opacity. Electronically Signed   By: Eddie Candle M.D.   On: 04/17/2020 10:23   DG Chest Port 1 View  Result Date: 04/16/2020 CLINICAL DATA:  Shortness of breath, COVID-19 positive. EXAM: PORTABLE CHEST 1 VIEW COMPARISON:  April 09, 2020. FINDINGS: The heart size and mediastinal contours are within normal limits. No pneumothorax or pleural effusion is noted. Stable mild diffuse interstitial reticular  densities are noted in the lungs bilaterally concerning for atypical inflammation. The visualized skeletal structures are unremarkable. IMPRESSION: Stable mild diffuse interstitial reticular densities are noted in the lungs bilaterally concerning for atypical inflammation. Electronically Signed   By: Marijo Conception M.D.   On: 04/16/2020 08:13   DG Chest Portable 1 View  Result Date: 04/11/2020 CLINICAL DATA:  Shortness of breath.  COVID-19 exposure EXAM: PORTABLE CHEST 1 VIEW COMPARISON:  01/13/2012 FINDINGS: The heart size and mediastinal contours are within normal limits.  Atherosclerotic calcification of the aortic knob. Diffuse increased interstitial markings throughout both lungs. No pleural effusion or pneumothorax. The visualized skeletal structures are unremarkable. IMPRESSION: Diffuse increased interstitial markings throughout both lungs, which may reflect edema versus atypical/viral infection. Electronically Signed   By: Davina Poke D.O.   On: 03/24/2020 13:36   DG Abd Portable 1V  Result Date: 04/10/2020 CLINICAL DATA:  Abdominal pain. EXAM: PORTABLE ABDOMEN - 1 VIEW COMPARISON:  None. FINDINGS: The bowel gas pattern is normal. No radio-opaque calculi or other significant radiographic abnormality are seen. IMPRESSION: Negative. Electronically Signed   By: Marijo Conception M.D.   On: 04/10/2020 15:17   VAS [place] LOWER EXTREMITY VENOUS (DVT)  Result Date: 04/15/2020  Lower Venous DVTStudy Indications: Elevated d dimer >20, aware of recent negative doppler.  Comparison Study: 04/11/20 previous Performing Technologist: Abram Sander RVS  Examination Guidelines: A complete evaluation includes B-mode imaging, spectral Doppler, color Doppler, and power Doppler as needed of all accessible portions of each vessel. Bilateral testing is considered an integral part of a complete examination. Limited examinations for reoccurring indications may be performed as noted. The reflux portion of the exam is performed with the patient in reverse Trendelenburg.  +---------+---------------+---------+-----------+----------+--------------+  RIGHT     Compressibility Phasicity Spontaneity Properties Thrombus Aging  +---------+---------------+---------+-----------+----------+--------------+  CFV       Full            Yes       Yes                                    +---------+---------------+---------+-----------+----------+--------------+  SFJ       Full                                                             +---------+---------------+---------+-----------+----------+--------------+  FV  Prox   Full                                                             +---------+---------------+---------+-----------+----------+--------------+  FV Mid    Full                                                             +---------+---------------+---------+-----------+----------+--------------+  FV Distal Full                                                             +---------+---------------+---------+-----------+----------+--------------+  PFV       Full                                                             +---------+---------------+---------+-----------+----------+--------------+  POP       Full            Yes       Yes                                    +---------+---------------+---------+-----------+----------+--------------+  PTV       Full                                                             +---------+---------------+---------+-----------+----------+--------------+  PERO      Full                                                             +---------+---------------+---------+-----------+----------+--------------+   +---------+---------------+---------+-----------+----------+--------------+  LEFT      Compressibility Phasicity Spontaneity Properties Thrombus Aging  +---------+---------------+---------+-----------+----------+--------------+  CFV       Full            Yes       Yes                                    +---------+---------------+---------+-----------+----------+--------------+  SFJ       Full                                                             +---------+---------------+---------+-----------+----------+--------------+  FV Prox   Full                                                             +---------+---------------+---------+-----------+----------+--------------+  FV Mid    Full                                                             +---------+---------------+---------+-----------+----------+--------------+  FV Distal Full                                                              +---------+---------------+---------+-----------+----------+--------------+  PFV       Full                                                             +---------+---------------+---------+-----------+----------+--------------+  POP       Full            Yes       Yes                                    +---------+---------------+---------+-----------+----------+--------------+  PTV       Full                                                             +---------+---------------+---------+-----------+----------+--------------+  PERO      Full                                                             +---------+---------------+---------+-----------+----------+--------------+     Summary: BILATERAL: - No evidence of deep vein thrombosis seen in the lower extremities, bilaterally. - No evidence of superficial venous thrombosis in the lower extremities, bilaterally. -   *See table(s) above for measurements and observations. Electronically signed by Harold Barban MD on 04/15/2020 at 8:50:57 PM.    Final    VAS [place] LOWER EXTREMITY VENOUS (DVT)  Result Date: 04/11/2020  Lower Venous DVTStudy Indications: Elevated Ddimer.  Risk Factors: COVID 19 positive. Comparison Study: No prior studies. Performing Technologist: Oliver Hum RVT  Examination Guidelines: A complete evaluation includes B-mode imaging, spectral Doppler, color Doppler, and power Doppler as needed of all accessible portions of each vessel. Bilateral testing is considered an integral part of a complete examination. Limited examinations for reoccurring indications may be performed as noted. The reflux portion of the exam is performed with the patient in reverse Trendelenburg.  +---------+---------------+---------+-----------+----------+--------------+  RIGHT     Compressibility Phasicity Spontaneity Properties Thrombus Aging  +---------+---------------+---------+-----------+----------+--------------+  CFV       Full            Yes        Yes                                    +---------+---------------+---------+-----------+----------+--------------+  SFJ       Full                                                             +---------+---------------+---------+-----------+----------+--------------+  FV Prox   Full                                                             +---------+---------------+---------+-----------+----------+--------------+  FV Mid    Full                                                             +---------+---------------+---------+-----------+----------+--------------+  FV Distal Full                                                             +---------+---------------+---------+-----------+----------+--------------+  PFV       Full                                                             +---------+---------------+---------+-----------+----------+--------------+  POP       Full            Yes       Yes                                    +---------+---------------+---------+-----------+----------+--------------+  PTV       Full                                                             +---------+---------------+---------+-----------+----------+--------------+  PERO      Full                                                             +---------+---------------+---------+-----------+----------+--------------+   +---------+---------------+---------+-----------+----------+--------------+  LEFT      Compressibility Phasicity Spontaneity Properties Thrombus Aging  +---------+---------------+---------+-----------+----------+--------------+  CFV       Full            Yes       Yes                                    +---------+---------------+---------+-----------+----------+--------------+  SFJ       Full                                                             +---------+---------------+---------+-----------+----------+--------------+  FV Prox   Full                                                              +---------+---------------+---------+-----------+----------+--------------+  FV Mid    Full                                                             +---------+---------------+---------+-----------+----------+--------------+  FV Distal Full                                                             +---------+---------------+---------+-----------+----------+--------------+  PFV       Full                                                             +---------+---------------+---------+-----------+----------+--------------+  POP       Full            Yes       Yes                                    +---------+---------------+---------+-----------+----------+--------------+  PTV       Full                                                             +---------+---------------+---------+-----------+----------+--------------+  PERO      Full                                                             +---------+---------------+---------+-----------+----------+--------------+     Summary: RIGHT: - There is no evidence of deep vein thrombosis in the lower extremity.  - No cystic structure found in the popliteal fossa.  LEFT: - There is no evidence of deep vein thrombosis in the lower extremity.  - No cystic structure found in the popliteal fossa.  *See table(s) above for measurements and observations. Electronically signed by Servando Snare MD on 04/11/2020 at 4:56:04 PM.    Final

## 2020-04-18 NOTE — Progress Notes (Signed)
PT Cancellation Note  Patient Details Name: CAYLAH PLOUFF MRN: [id number] DOB: 08/18/1940   Cancelled Treatment:    Reason Eval/Treat Not Completed: Medical issues which prohibited therapy holding PT for today per medical team request. Will continue to follow acutely.    Windell Norfolk, DPT, PN1   Supplemental Physical Therapist [facility]    Pager [phone] Acute Rehab Office [phone]

## 2020-04-18 NOTE — Progress Notes (Signed)
SLP Cancellation Note  Patient Details Name: [name] MRN: [id number] DOB: [birth year]   Cancelled treatment:       Reason Eval/Treat Not Completed: Patient not medically ready. Per other notes, pt proned, not able to eat and drink today. Will f/u for assessment tomorrow.    Wen Merced, Katherene Ponto 04/18/2020, 10:55 AM

## 2020-04-18 NOTE — Progress Notes (Signed)
Pt RR increased while sleeping and o2 sats maintaining 85-90.

## 2020-04-18 NOTE — Progress Notes (Signed)
Pt a 5 min coughing spell and desatted to the 70s and it took her all of 10 mins to recover. Cough medicine given

## 2020-04-18 NOTE — Progress Notes (Signed)
OT Cancellation Note  Patient Details Name: [name] MRN: [id number] DOB: 06-Oct-1940   Cancelled Treatment:    Reason Eval/Treat Not Completed: Medical issues which prohibited therapy. Nsg asking to hold.Pt having to stay proned in order to keep O2 Sats up. States she was unable to tolerate eating breakfast this am. Will assess when able to participate  [facility] [place] Pager [phone] Office [phone]  04/18/2020, 10:08 AM

## 2020-04-18 NOTE — Plan of Care (Signed)
°  Problem: Education: Goal: Knowledge of General Education information will improve Description: Including pain rating scale, medication(s)/side effects and non-pharmacologic comfort measures 04/18/2020 0000 by Valora Corporal, RN Outcome: Progressing 04/17/2020 2359 by Valora Corporal, RN Outcome: Progressing   Problem: Health Behavior/Discharge Planning: Goal: Ability to manage health-related needs will improve 04/18/2020 0000 by Valora Corporal, RN Outcome: Progressing 04/17/2020 2359 by Valora Corporal, RN Outcome: Progressing   Problem: Clinical Measurements: Goal: Will remain free from infection 04/18/2020 0000 by Valora Corporal, RN Outcome: Progressing 04/17/2020 2359 by Valora Corporal, RN Outcome: Progressing Goal: Diagnostic test results will improve 04/18/2020 0000 by Valora Corporal, RN Outcome: Progressing 04/17/2020 2359 by Valora Corporal, RN Outcome: Progressing   Problem: Education: Goal: Knowledge of risk factors and measures for prevention of condition will improve Outcome: Progressing   Problem: Coping: Goal: Psychosocial and spiritual needs will be supported Outcome: Progressing   Problem: Respiratory: Goal: Will maintain a patent airway Outcome: Progressing Goal: Complications related to the disease process, condition or treatment will be avoided or minimized Outcome: Progressing

## 2020-04-19 ENCOUNTER — Inpatient Hospital Stay (HOSPITAL_COMMUNITY): Payer: Medicare Other

## 2020-04-19 LAB — MAGNESIUM: Magnesium: 2.7 mg/dL — ABNORMAL HIGH (ref 1.7–2.4)

## 2020-04-19 LAB — PROCALCITONIN: Procalcitonin: <0.1 ng/mL

## 2020-04-19 LAB — C-REACTIVE PROTEIN: CRP: 5.5 mg/dL — ABNORMAL HIGH (ref <1.0)

## 2020-04-19 LAB — COMPREHENSIVE METABOLIC PANEL
ALT: 58 U/L — ABNORMAL HIGH (ref 0–44)
AST: 34 U/L (ref 15–41)
Albumin: 2.4 g/dL — ABNORMAL LOW (ref 3.5–5.0)
Alkaline Phosphatase: 119 U/L (ref 38–126)
Anion gap: 15 (ref 5–15)
BUN: 35 mg/dL — ABNORMAL HIGH (ref 8–23)
CO2: 22 mmol/L (ref 22–32)
Calcium: 8.8 mg/dL — ABNORMAL LOW (ref 8.9–10.3)
Chloride: 103 mmol/L (ref 98–111)
Creatinine, Ser: 1.01 mg/dL — ABNORMAL HIGH (ref 0.44–1.00)
GFR calc Af Amer: >60 mL/min (ref >60)
GFR calc non Af Amer: 53 mL/min — ABNORMAL LOW (ref >60)
Glucose, Bld: 161 mg/dL — ABNORMAL HIGH (ref 70–99)
Potassium: 5.1 mmol/L (ref 3.5–5.1)
Sodium: 140 mmol/L (ref 135–145)
Total Bilirubin: 0.8 mg/dL (ref 0.3–1.2)
Total Protein: 6.3 g/dL — ABNORMAL LOW (ref 6.5–8.1)

## 2020-04-19 LAB — CBC
HCT: 41.2 % (ref 36.0–46.0)
Hemoglobin: 12.9 g/dL (ref 12.0–15.0)
MCH: 29 pg (ref 26.0–34.0)
MCHC: 31.3 g/dL (ref 30.0–36.0)
MCV: 92.6 fL (ref 80.0–100.0)
Platelets: 525 10*3/uL — ABNORMAL HIGH (ref 150–400)
RBC: 4.45 MIL/uL (ref 3.87–5.11)
RDW: 13.1 % (ref 11.5–15.5)
WBC: 29.1 10*3/uL — ABNORMAL HIGH (ref 4.0–10.5)
nRBC: 0 % (ref 0.0–0.2)

## 2020-04-19 LAB — BRAIN NATRIURETIC PEPTIDE: B Natriuretic Peptide: 100.1 pg/mL — ABNORMAL HIGH (ref 0.0–100.0)

## 2020-04-19 LAB — D-DIMER, QUANTITATIVE: D-Dimer, Quant: 3.17 ug/mL-FEU — ABNORMAL HIGH (ref 0.00–0.50)

## 2020-04-19 MED ORDER — SODIUM POLYSTYRENE SULFONATE 15 GM/60ML PO SUSP
15.0000 g | Freq: Once | ORAL | Status: AC
  Administered 2020-04-19: 15 g via ORAL
  Filled 2020-04-19: qty 60

## 2020-04-19 MED ORDER — CHLORHEXIDINE GLUCONATE CLOTH 2 % EX PADS
6.0000 | MEDICATED_PAD | Freq: Every day | CUTANEOUS | Status: DC
  Administered 2020-04-19 – 2020-04-20 (×2): 6 via TOPICAL

## 2020-04-19 MED ORDER — BARICITINIB 2 MG PO TABS
2.0000 mg | ORAL_TABLET | Freq: Every day | ORAL | Status: DC
  Administered 2020-04-20 – 2020-04-22 (×3): 2 mg via ORAL
  Filled 2020-04-19 (×3): qty 1

## 2020-04-19 MED ORDER — POLYETHYLENE GLYCOL 3350 17 G PO PACK
17.0000 g | PACK | Freq: Two times a day (BID) | ORAL | Status: DC
  Administered 2020-04-19 – 2020-04-20 (×3): 17 g via ORAL
  Filled 2020-04-19 (×3): qty 1

## 2020-04-19 NOTE — Progress Notes (Signed)
PROGRESS NOTE                                                                                                                                                                                                             Patient Demographics:    [name], is a 79 y.o. female, DOB - [birth year], EXB:[id number]  Outpatient Primary MD for the patient is [name], MD    LOS - 10  Admit date - 04/03/2020    Chief Complaint  Patient presents with  . Shortness of Breath       Brief Narrative - Cassandra A Suttonis a 79 y.o.femalewith medical history significant ofhypertension, migraine headache, GERD, hyperlipidemia presents to emergency department with worsening shortness of breath.  Patient is fully vaccinated, she presents with COVID-19 symptoms, she was noted to be hypoxic, admitted for further work-up , unfortunately she kept having increased oxygen requirement. 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 No history of smoking, alcohol, licit drug use.    Subjective:   Patient in bed, appears comfortable, denies any headache, no fever, no chest pain or pressure, ++ shortness of breath , no abdominal pain. No focal weakness.    Assessment  & Plan :     1. Acute Hypoxic Resp. Failure due to Acute Covid 19 Viral Pneumonitis during the ongoing 2020 Covid 19 Pandemic - she has incurred severe parenchymal lung injury, she is severely hypoxic and being treated with combination of IV steroids, remdesivir and Baricitinib. Quite tenuous on 60 L of heated high flow along + nonrebreather. CTA ruled out PE.  Note she is fully vaccinated however she is about 6 months out of her last vaccine shot and is immunocompromised due to being on immunosuppressive medications.    We will also try IV Lasix on 04/17/2020 due to worsening hypoxia and few rails, also due to rising leukocytosis and  risk of aspiration she was started on Unasyn on 04/17/2020 which will be continued, speech is also following and she is on a soft diet.  Encouraged the patient to sit up in chair in the daytime use I-S and flutter valve for pulmonary toiletry and then prone in bed when at night.  Will advance activity and titrate down oxygen as possible.  Detailed discussions with patient and patient's granddaughter, patient wishes  not to be on a ventilator, if she further declines she wants to be made comfortable.  This was also communicated to the daughter and she is agreeable.  If there is further decline despite the maximum treatment that she is getting we will transition to comfort care.   SpO2: 90 % O2 Flow Rate (L/min): 60 L/min FiO2 (%): 100 %  Recent Labs  Lab 04/13/20 0300 04/14/20 0310 04/15/20 0432 04/16/20 0629 04/16/20 0718 04/16/20 0723 04/17/20 1336 04/17/20 1337 04/18/20 1220 04/19/20 0132 04/19/20 0138  WBC 16.8*   < > 20.3* 21.0*  --   --  24.6*  --  31.2*  --  29.1*  PLT 414*   < > 395 447*  --   --  528*  --  653*  --  525*  CRP  --   --  6.5* 9.5*  --   --  4.6*  --   --   --  5.5*  BNP 364.1*  --   --   --  66.6  --   --  54.0 48.2 100.1*  --   DDIMER 3.26*   < > >20.00* 14.11*  --   --  9.04*  --  6.29*  --  3.17*  PROCALCITON  --    < > 0.12  --   --  <0.10 <0.10  --  <0.10  --  <0.10  AST 69*   < > 31 32  --   --  39  --  37  --  34  ALT 178*   < > 89* 73*  --   --  75*  --  66*  --  58*  ALKPHOS 106   < > 95 98  --   --  113  --  133*  --  119  BILITOT 0.2*   < > 0.8 0.5  --   --  1.0  --  0.6  --  0.8  ALBUMIN 2.5*   < > 2.4* 2.4*  --   --  2.6*  --  2.7*  --  2.4*   < > = values in this interval not displayed.      2. Extremely elevated D-dimer. With negative CTA and leg ultrasound. On full dose Lovenox. This will be continued till D-dimer dips below 2 as the risk of developing a clot in the future is extremely high due to intense inflammation.  3. Asymptomatic  transaminitis due to COVID-19 viral infection. Stable ultrasound showing fatty liver. Monitor trend which is improving.  4. Abdominal pain. Resolved. Likely due to constipation, stable abdominal ultrasound.  5. Essential hypertension. Currently stable off of medications.  6. GERD. On PPI.  7. Dyslipidemia. On statin.  8. HX of chronic back pain and migraine headaches. On combination of tramadol and Imitrex.    Condition - Extremely Guarded  Family Communication  :  granddaughter Cyril Mourning [phone] on 04/16/20, 04/17/20 (patient left at 11:51 AM on 9/2/202) -  guarded prognosis & now DNR communicated. 04/18/20, 04/19/20  Code Status : DNR  Consults  :  None  Procedures  :    CTA -  1. No evidence of acute pulmonary embolism. 2. Multifocal areas of mixed ground-glass and consolidative opacity with a peripheral predominance with more dense opacification in the dependent portions of the lungs likely reflecting some superimposed atelectatic changes. Findings are favored to represent multifocal pneumonia in the setting of COVID-19 positivity. 3. Trace left pleural effusion. 4. Aortic Atherosclerosis (ICD10-I70.0). 5.  Coronary artery calcifications are present. Please note that the presence of coronary artery calcium documents the presence of coronary artery disease, the severity of this disease and any potential stenosis cannot be assessed on this non-gated CT examination.  Leg [place] -  RIGHT: - There is no evidence of deep vein thrombosis in the lower extremity.  - No cystic structure found in the popliteal fossa.  LEFT: - There is no evidence of deep vein thrombosis in the lower extremity.  - No cystic structure found in the popliteal fossa.  Abd [place] - 1. Negative for gallstones or biliary dilatation 2. Echogenic liver consistent with steatosis and or hepatocellular disease. 3. Simple appearing left renal cyst   PUD Prophylaxis : None  Disposition Plan  :    Status is: Inpatient  Remains  inpatient appropriate because:IV treatments appropriate due to intensity of illness or inability to take PO   Dispo: The patient is from: Home              Anticipated d/c is to: Home              Anticipated d/c date is: > 3 days              Patient currently is not medically stable to d/c.   DVT Prophylaxis  :  Lovenox    Lab Results  Component Value Date   PLT 525 (H) 04/19/2020    Diet :  Diet Order            DIET SOFT Room service appropriate? Yes; Fluid consistency: Thin  Diet effective now                  Inpatient Medications  Scheduled Meds: . AeroChamber Plus Flo-Vu Large  1 each Other Once  . albuterol  2 puff Inhalation Q6H  . vitamin C  500 mg Oral Daily  . atorvastatin  20 mg Oral Daily  . baricitinib  4 mg Oral Daily  . enoxaparin (LOVENOX) injection  70 mg Subcutaneous Q12H  . feeding supplement (ENSURE ENLIVE)  237 mL Oral BID BM  . gabapentin  300 mg Oral BID  . hydroxychloroquine  200 mg Oral BID  . methylPREDNISolone (SOLU-MEDROL) injection  40 mg Intravenous BID  . pantoprazole  40 mg Oral Daily  . zinc sulfate  220 mg Oral Daily   Continuous Infusions: . ampicillin-sulbactam (UNASYN) IV 3 g (04/19/20 1106)   PRN Meds:.acetaminophen, chlorpheniramine-HYDROcodone, guaiFENesin-dextromethorphan, [DISCONTINUED] ondansetron **OR** ondansetron (ZOFRAN) IV, traMADol  Antibiotics  :    Anti-infectives (From admission, onward)   Start     Dose/Rate Route Frequency Ordered Stop   04/17/20 1630  Ampicillin-Sulbactam (UNASYN) 3 g in sodium chloride 0.9 % 100 mL IVPB        3 g 200 mL/hr over 30 Minutes Intravenous Every 8 hours 04/17/20 1526     04/13/20 1200  remdesivir 100 mg in sodium chloride 0.9 % 100 mL IVPB        100 mg 200 mL/hr over 30 Minutes Intravenous Daily 04/13/20 1150 04/16/20 1123   04/13/20 1100  remdesivir injection 100 mg  Status:  Discontinued        100 mg Intravenous Every 24 hours 04/13/20 0957 04/13/20 1153   04/12/20  1430  azithromycin (ZITHROMAX) tablet 500 mg        500 mg Oral Daily 04/12/20 1321 04/13/20 1218   04/10/20 1000  remdesivir 100 mg in sodium chloride 0.9 % 100 mL  IVPB  Status:  Discontinued       "Followed by" Linked Group Details   100 mg 200 mL/hr over 30 Minutes Intravenous Daily 04/01/2020 1551 04/10/20 0800   03/27/2020 2200  hydroxychloroquine (PLAQUENIL) tablet 200 mg        200 mg Oral 2 times daily 04/07/2020 1653     03/20/2020 1600  remdesivir 200 mg in sodium chloride 0.9% 250 mL IVPB       "Followed by" Linked Group Details   200 mg 580 mL/hr over 30 Minutes Intravenous Once 03/27/2020 1551 04/08/2020 2139   03/18/2020 1600  azithromycin (ZITHROMAX) 500 mg in sodium chloride 0.9 % 250 mL IVPB  Status:  Discontinued        500 mg 250 mL/hr over 60 Minutes Intravenous Every 24 hours 03/28/2020 1552 04/12/20 1321   04/05/2020 1600  cefTRIAXone (ROCEPHIN) 1 g in sodium chloride 0.9 % 100 mL IVPB        1 g 200 mL/hr over 30 Minutes Intravenous Every 24 hours 03/26/2020 1552 04/13/20 1641       Time Spent in minutes  30   Lala Lund M.D on 04/19/2020 at 11:32 AM  To page go to [email or web address] - password [facility]  [facility] -  Office  [phone]   See all Orders from today for further details    Objective:   Vitals:   04/18/20 2040 04/18/20 2300 04/19/20 0304 04/19/20 0853  BP: (!) 178/52 124/61  (!) 118/46  Pulse: 90 85 90   Resp: (!) 22 20 20    Temp: 97.6 F (36.4 C) 99.1 F (37.3 C) 98 F (36.7 C) 98.3 F (36.8 C)  TempSrc: Axillary Axillary  Oral  SpO2: 97% 90% 90%   Weight:      Height:        Wt Readings from Last 3 Encounters:  04/10/20 73 kg  10/22/19 73.4 kg  04/16/19 73.9 kg     Intake/Output Summary (Last 24 hours) at 04/19/2020 1132 Last data filed at 04/18/2020 1500 Gross per 24 hour  Intake 314.9 ml  Output 1100 ml  Net -785.1 ml     Physical Exam  Awake Alert, No new F.N deficits, Normal affect .AT,PERRAL Supple Neck,No JVD, No  cervical lymphadenopathy appriciated.  Symmetrical Chest wall movement, Good air movement bilaterally, +ve Rales RRR,No Gallops, Rubs or new Murmurs, No Parasternal Heave +ve B.Sounds, Abd Soft, No tenderness, No organomegaly appriciated, No rebound - guarding or rigidity. No Cyanosis, Clubbing or edema, No new Rash or bruise    Data Review:    CBC Recent Labs  Lab 04/13/20 0300 04/13/20 0300 04/14/20 0310 04/14/20 0310 04/15/20 0432 04/16/20 0629 04/17/20 1336 04/18/20 1220 04/19/20 0138  WBC 16.8*   < > 19.4*   < > 20.3* 21.0* 24.6* 31.2* 29.1*  HGB 11.5*   < > 12.5   < > 11.6* 11.9* 12.6 13.3 12.9  HCT 35.4*   < > 38.9   < > 36.1 37.8 39.4 41.6 41.2  PLT 414*   < > 361   < > 395 447* 528* 653* 525*  MCV 91.9   < > 90.7   < > 91.4 92.0 93.6 93.7 92.6  MCH 29.9   < > 29.1   < > 29.4 29.0 29.9 30.0 29.0  MCHC 32.5   < > 32.1   < > 32.1 31.5 32.0 32.0 31.3  RDW 13.2   < > 12.9   < > 13.0 13.1 13.2 13.2  13.1  LYMPHSABS 0.8  --  0.6*  --   --   --   --   --   --   MONOABS 0.5  --  0.5  --   --   --   --   --   --   EOSABS 0.1  --  0.0  --   --   --   --   --   --   BASOSABS 0.1  --  0.1  --   --   --   --   --   --    < > = values in this interval not displayed.    Recent Labs  Lab 04/13/20 0300 04/13/20 0300 04/14/20 0310 04/14/20 0310 04/15/20 0432 04/16/20 0629 04/16/20 0718 04/16/20 0723 04/17/20 1336 04/17/20 1337 04/18/20 1220 04/19/20 0132 04/19/20 0138  NA 143   < > 140   < > 140 141  --   --  141  --  140  --  140  K 3.9   < > 4.3   < > 4.1 4.4  --   --  4.4  --  4.5  --  5.1  CL 107   < > 102   < > 101 103  --   --  104  --  102  --  103  CO2 23   < > 25   < > 26 24  --   --  22  --  25  --  22  GLUCOSE 153*   < > 139*   < > 154* 127*  --   --  124*  --  115*  --  161*  BUN 20   < > 20   < > 25* 29*  --   --  27*  --  33*  --  35*  CREATININE 0.88   < > 0.85   < > 0.95 0.88  --   --  0.92  --  1.08*  --  1.01*  CALCIUM 8.8*   < > 8.9   < > 8.7* 8.6*   --   --  8.6*  --  9.0  --  8.8*  AST 69*   < > 46*   < > 31 32  --   --  39  --  37  --  34  ALT 178*   < > 137*   < > 89* 73*  --   --  75*  --  66*  --  58*  ALKPHOS 106   < > 117   < > 95 98  --   --  113  --  133*  --  119  BILITOT 0.2*   < > 0.6   < > 0.8 0.5  --   --  1.0  --  0.6  --  0.8  ALBUMIN 2.5*   < > 2.7*   < > 2.4* 2.4*  --   --  2.6*  --  2.7*  --  2.4*  MG 2.6*   < > 2.3  --   --   --   --  2.6* 2.5*  --  2.7*  --  2.7*  CRP  --   --   --   --  6.5* 9.5*  --   --  4.6*  --   --   --  5.5*  DDIMER 3.26*   < > >20.00*   < > >20.00* 14.11*  --   --  9.04*  --  6.29*  --  3.17*  PROCALCITON  --   --  <0.10   < > 0.12  --   --  <0.10 <0.10  --  <0.10  --  <0.10  BNP 364.1*  --   --   --   --   --  66.6  --   --  54.0 48.2 100.1*  --    < > = values in this interval not displayed.    ------------------------------------------------------------------------------------------------------------------ No results for input(s): CHOL, HDL, LDLCALC, TRIG, CHOLHDL, LDLDIRECT in the last 72 hours.  Lab Results  Component Value Date   HGBA1C 5.6 10/22/2019   ------------------------------------------------------------------------------------------------------------------ No results for input(s): TSH, T4TOTAL, T3FREE, THYROIDAB in the last 72 hours.  Invalid input(s): FREET3  Cardiac Enzymes No results for input(s): CKMB, TROPONINI, MYOGLOBIN in the last 168 hours.  Invalid input(s): CK ------------------------------------------------------------------------------------------------------------------    Component Value Date/Time   BNP 100.1 (H) 04/19/2020 0132    Micro Results Recent Results (from the past 240 hour(s))  SARS Coronavirus 2 by RT PCR (hospital order, performed in [facility] hospital lab) Nasopharyngeal Nasopharyngeal Swab     Status: Abnormal   Collection Time: 04/04/2020 12:38 PM   Specimen: Nasopharyngeal Swab  Result Value Ref Range Status   SARS Coronavirus  2 POSITIVE (A) NEGATIVE Final    Comment: RESULT CALLED TO, READ BACK BY AND VERIFIED WITH: Precious Reel RN 15:30 03/22/2020 ([email or web address]) (NOTE) SARS-CoV-2 target nucleic acids are DETECTED  SARS-CoV-2 RNA is generally detectable in upper respiratory specimens  during the acute phase of infection.  Positive results are indicative  of the presence of the identified virus, but do not rule out bacterial infection or co-infection with other pathogens not detected by the test.  Clinical correlation with patient history and  other diagnostic information is necessary to determine patient infection status.  The expected result is negative.  Fact Sheet for Patients:   StrictlyIdeas.no   Fact Sheet for Healthcare Providers:   BankingDealers.co.za    This test is not yet approved or cleared by the [place] FDA and  has been authorized for detection and/or diagnosis of SARS-CoV-2 by FDA under an Emergency Use Authorization (EUA).  This EUA will remain in effect (meaning this  test can be used) for the duration of  the COVID-19 declaration under Section 564(b)(1) of the Act, 21 U.S.C. section 360-bbb-3(b)(1), unless the authorization is terminated or revoked sooner.  Performed at [facility], [place] [address] [place], [place] [address]   Blood Culture (routine x 2)     Status: None   Collection Time: 03/25/2020  1:02 PM   Specimen: BLOOD  Result Value Ref Range Status   Specimen Description BLOOD LEFT ANTECUBITAL  Final   Special Requests   Final    BOTTLES DRAWN AEROBIC AND ANAEROBIC Blood Culture results may not be optimal due to an inadequate volume of blood received in culture bottles   Culture   Final    NO GROWTH 5 DAYS Performed at [facility], [place] [address], [place], [place] [address]    Report Status 04/14/2020 FINAL  Final  Blood Culture (routine x 2)     Status: None   Collection Time: 03/30/2020  1:07 PM   Specimen: BLOOD   Result Value Ref Range Status   Specimen Description BLOOD RIGHT ANTECUBITAL  Final   Special Requests   Final    BOTTLES DRAWN AEROBIC ONLY Blood Culture results may not  be optimal due to an inadequate volume of blood received in culture bottles   Culture   Final    NO GROWTH 5 DAYS Performed at [facility], [place] [address], [place], [place] [address]    Report Status 04/14/2020 FINAL  Final  MRSA PCR Screening     Status: None   Collection Time: 04/17/20  3:14 PM   Specimen: Nasal Mucosa; Nasopharyngeal  Result Value Ref Range Status   MRSA by PCR NEGATIVE NEGATIVE Final    Comment:        The GeneXpert MRSA Assay (FDA approved for NASAL specimens only), is one component of a comprehensive MRSA colonization surveillance program. It is not intended to diagnose MRSA infection nor to guide or monitor treatment for MRSA infections. Performed at [facility], [place] [address], [place], [place] [address]     Radiology Reports CT ANGIO CHEST PE W OR WO CONTRAST  Result Date: 04/15/2020 CLINICAL DATA:  PE suspected, COVID-19 positive EXAM: CT ANGIOGRAPHY CHEST WITH CONTRAST TECHNIQUE: Multidetector CT imaging of the chest was performed using the standard protocol during bolus administration of intravenous contrast. Multiplanar CT image reconstructions and MIPs were obtained to evaluate the vascular anatomy. CONTRAST:  98mL OMNIPAQUE IOHEXOL 350 MG/ML SOLN COMPARISON:  Radiograph 03/23/2020 FINDINGS: Cardiovascular: Satisfactory opacification the pulmonary arteries to the segmental level. No pulmonary artery filling defects are identified. Central pulmonary arteries are normal caliber. Normal heart size. No pericardial effusion. Scattered coronary artery calcifications are present. Atherosclerotic plaque within the normal caliber aorta. No acute luminal abnormality nor periaortic stranding hemorrhage. Shared origin of the brachiocephalic and left common carotid artery. Minimal  plaque in the otherwise normal proximal great vessels. Mediastinum/Nodes: Few scattered low-attenuation subcentimeter mediastinal and hilar nodes are present. No enlarged mediastinal, hilar or axillary adenopathy. No acute abnormality of the trachea or esophagus. Thyroid gland demonstrates few subcentimeter hypoattenuating foci, without significant enlargement of the gland. No further imaging follow-up is warranted in a patient of this age (Reference: J Am Coll Radiol. 2015 Feb;12(2): 143-50). Lungs/Pleura: There are multifocal areas of mixed ground-glass and consolidative opacity with a peripheral predominance with more dense opacification in the dependent portions of the lungs likely reflecting some superimposed atelectatic changes. There is a trace left effusion as well. No pneumothorax or right effusion. Small air cyst seen in the right lung base (5/67). Upper Abdomen: No acute abnormalities present in the visualized portions of the upper abdomen. Small accessory splenule Musculoskeletal: Multilevel discogenic and facet degenerative changes, maximal C5-6 where sclerotic, likely Modic type endplate changes are present with some mild retrolisthesis. Exaggerated thoracic kyphosis is noted with a slight pectus deformity of the chest (Haller index 2.55). No acute or suspicious osseous lesions. Review of the MIP images confirms the above findings. IMPRESSION: 1. No evidence of acute pulmonary embolism. 2. Multifocal areas of mixed ground-glass and consolidative opacity with a peripheral predominance with more dense opacification in the dependent portions of the lungs likely reflecting some superimposed atelectatic changes. Findings are favored to represent multifocal pneumonia in the setting of COVID-19 positivity. 3. Trace left pleural effusion. 4. Aortic Atherosclerosis (ICD10-I70.0). 5. Coronary artery calcifications are present. Please note that the presence of coronary artery calcium documents the presence of  coronary artery disease, the severity of this disease and any potential stenosis cannot be assessed on this non-gated CT examination. Electronically Signed   By: Lovena Le M.D.   On: 04/15/2020 16:00   US Abdomen Complete  Result Date: 04/10/2020  CLINICAL DATA:  Epigastric pain EXAM: ABDOMEN ULTRASOUND COMPLETE COMPARISON:  None. FINDINGS: Gallbladder: No gallstones or wall thickening visualized. No sonographic Murphy sign noted by sonographer. Common bile duct: Diameter: 3.3 mm Liver: Liver is echogenic. No focal hepatic abnormality. Portal vein is patent on color Doppler imaging with normal direction of blood flow towards the liver. IVC: No abnormality visualized. Pancreas: Visualized portion unremarkable. Spleen: Size and appearance within normal limits. Right Kidney: Length: 10 cm. Echogenicity within normal limits. No mass or hydronephrosis visualized. Left Kidney: Length: 10.4 cm. Echogenicity within normal limits. No hydronephrosis. Cyst off the lower pole measuring 3.1 cm. Abdominal aorta: No aneurysm visualized. Other findings: None. IMPRESSION: 1. Negative for gallstones or biliary dilatation 2. Echogenic liver consistent with steatosis and or hepatocellular disease. 3. Simple appearing left renal cyst Electronically Signed   By: Donavan Foil M.D.   On: 04/10/2020 18:12   DG Chest Port 1 View  Result Date: 04/19/2020 CLINICAL DATA:  Shortness of breath, COVID positive. EXAM: PORTABLE CHEST 1 VIEW COMPARISON:  Chest x-ray dated 04/17/2020. FINDINGS: Heart size and mediastinal contours are within normal limits. Patchy ground-glass opacities are stable within each lung, again most dense at the RIGHT lung base. No pleural effusion or pneumothorax is seen. IMPRESSION: Stable appearance of the bilateral multifocal pneumonia. Electronically Signed   By: Franki Cabot M.D.   On: 04/19/2020 08:50   DG Chest Port 1 View  Result Date: 04/17/2020 CLINICAL DATA:  Shortness of breath, COVID positive EXAM:  PORTABLE CHEST 1 VIEW COMPARISON:  04/16/2020 FINDINGS: No significant change in AP portable chest radiograph with heterogeneous airspace opacities present in the peripheral mid lungs and lung bases. No new airspace opacity. The heart and mediastinum are unremarkable. IMPRESSION: No significant change in AP portable chest radiograph with heterogeneous airspace opacities present in the peripheral mid lungs and lung bases, in keeping with COVID airspace disease. No new airspace opacity. Electronically Signed   By: Eddie Candle M.D.   On: 04/17/2020 10:23   DG Chest Port 1 View  Result Date: 04/16/2020 CLINICAL DATA:  Shortness of breath, COVID-19 positive. EXAM: PORTABLE CHEST 1 VIEW COMPARISON:  April 09, 2020. FINDINGS: The heart size and mediastinal contours are within normal limits. No pneumothorax or pleural effusion is noted. Stable mild diffuse interstitial reticular densities are noted in the lungs bilaterally concerning for atypical inflammation. The visualized skeletal structures are unremarkable. IMPRESSION: Stable mild diffuse interstitial reticular densities are noted in the lungs bilaterally concerning for atypical inflammation. Electronically Signed   By: Marijo Conception M.D.   On: 04/16/2020 08:13   DG Chest Portable 1 View  Result Date: 04/08/2020 CLINICAL DATA:  Shortness of breath.  COVID-19 exposure EXAM: PORTABLE CHEST 1 VIEW COMPARISON:  01/13/2012 FINDINGS: The heart size and mediastinal contours are within normal limits. Atherosclerotic calcification of the aortic knob. Diffuse increased interstitial markings throughout both lungs. No pleural effusion or pneumothorax. The visualized skeletal structures are unremarkable. IMPRESSION: Diffuse increased interstitial markings throughout both lungs, which may reflect edema versus atypical/viral infection. Electronically Signed   By: Davina Poke D.O.   On: 03/18/2020 13:36   DG Abd Portable 1V  Result Date: 04/10/2020 CLINICAL DATA:   Abdominal pain. EXAM: PORTABLE ABDOMEN - 1 VIEW COMPARISON:  None. FINDINGS: The bowel gas pattern is normal. No radio-opaque calculi or other significant radiographic abnormality are seen. IMPRESSION: Negative. Electronically Signed   By: Marijo Conception M.D.   On: 04/10/2020 15:17   VAS [place] LOWER  EXTREMITY VENOUS (DVT)  Result Date: 04/15/2020  Lower Venous DVTStudy Indications: Elevated d dimer >20, aware of recent negative doppler.  Comparison Study: 04/11/20 previous Performing Technologist: Abram Sander RVS  Examination Guidelines: A complete evaluation includes B-mode imaging, spectral Doppler, color Doppler, and power Doppler as needed of all accessible portions of each vessel. Bilateral testing is considered an integral part of a complete examination. Limited examinations for reoccurring indications may be performed as noted. The reflux portion of the exam is performed with the patient in reverse Trendelenburg.  +---------+---------------+---------+-----------+----------+--------------+ RIGHT    CompressibilityPhasicitySpontaneityPropertiesThrombus Aging +---------+---------------+---------+-----------+----------+--------------+ CFV      Full           Yes      Yes                                 +---------+---------------+---------+-----------+----------+--------------+ SFJ      Full                                                        +---------+---------------+---------+-----------+----------+--------------+ FV Prox  Full                                                        +---------+---------------+---------+-----------+----------+--------------+ FV Mid   Full                                                        +---------+---------------+---------+-----------+----------+--------------+ FV DistalFull                                                        +---------+---------------+---------+-----------+----------+--------------+ PFV      Full                                                         +---------+---------------+---------+-----------+----------+--------------+ POP      Full           Yes      Yes                                 +---------+---------------+---------+-----------+----------+--------------+ PTV      Full                                                        +---------+---------------+---------+-----------+----------+--------------+ PERO     Full                                                        +---------+---------------+---------+-----------+----------+--------------+   +---------+---------------+---------+-----------+----------+--------------+  LEFT     CompressibilityPhasicitySpontaneityPropertiesThrombus Aging +---------+---------------+---------+-----------+----------+--------------+ CFV      Full           Yes      Yes                                 +---------+---------------+---------+-----------+----------+--------------+ SFJ      Full                                                        +---------+---------------+---------+-----------+----------+--------------+ FV Prox  Full                                                        +---------+---------------+---------+-----------+----------+--------------+ FV Mid   Full                                                        +---------+---------------+---------+-----------+----------+--------------+ FV DistalFull                                                        +---------+---------------+---------+-----------+----------+--------------+ PFV      Full                                                        +---------+---------------+---------+-----------+----------+--------------+ POP      Full           Yes      Yes                                 +---------+---------------+---------+-----------+----------+--------------+ PTV      Full                                                         +---------+---------------+---------+-----------+----------+--------------+ PERO     Full                                                        +---------+---------------+---------+-----------+----------+--------------+     Summary: BILATERAL: - No evidence of deep vein thrombosis seen in the lower extremities, bilaterally. - No evidence of superficial venous thrombosis in the lower extremities, bilaterally. -   *See table(s) above for measurements and observations. Electronically signed by Harold Barban MD  on 04/15/2020 at 8:50:57 PM.    Final    VAS [place] LOWER EXTREMITY VENOUS (DVT)  Result Date: 04/11/2020  Lower Venous DVTStudy Indications: Elevated Ddimer.  Risk Factors: COVID 19 positive. Comparison Study: No prior studies. Performing Technologist: Oliver Hum RVT  Examination Guidelines: A complete evaluation includes B-mode imaging, spectral Doppler, color Doppler, and power Doppler as needed of all accessible portions of each vessel. Bilateral testing is considered an integral part of a complete examination. Limited examinations for reoccurring indications may be performed as noted. The reflux portion of the exam is performed with the patient in reverse Trendelenburg.  +---------+---------------+---------+-----------+----------+--------------+ RIGHT    CompressibilityPhasicitySpontaneityPropertiesThrombus Aging +---------+---------------+---------+-----------+----------+--------------+ CFV      Full           Yes      Yes                                 +---------+---------------+---------+-----------+----------+--------------+ SFJ      Full                                                        +---------+---------------+---------+-----------+----------+--------------+ FV Prox  Full                                                        +---------+---------------+---------+-----------+----------+--------------+ FV Mid   Full                                                         +---------+---------------+---------+-----------+----------+--------------+ FV DistalFull                                                        +---------+---------------+---------+-----------+----------+--------------+ PFV      Full                                                        +---------+---------------+---------+-----------+----------+--------------+ POP      Full           Yes      Yes                                 +---------+---------------+---------+-----------+----------+--------------+ PTV      Full                                                        +---------+---------------+---------+-----------+----------+--------------+ PERO  Full                                                        +---------+---------------+---------+-----------+----------+--------------+   +---------+---------------+---------+-----------+----------+--------------+ LEFT     CompressibilityPhasicitySpontaneityPropertiesThrombus Aging +---------+---------------+---------+-----------+----------+--------------+ CFV      Full           Yes      Yes                                 +---------+---------------+---------+-----------+----------+--------------+ SFJ      Full                                                        +---------+---------------+---------+-----------+----------+--------------+ FV Prox  Full                                                        +---------+---------------+---------+-----------+----------+--------------+ FV Mid   Full                                                        +---------+---------------+---------+-----------+----------+--------------+ FV DistalFull                                                        +---------+---------------+---------+-----------+----------+--------------+ PFV      Full                                                         +---------+---------------+---------+-----------+----------+--------------+ POP      Full           Yes      Yes                                 +---------+---------------+---------+-----------+----------+--------------+ PTV      Full                                                        +---------+---------------+---------+-----------+----------+--------------+ PERO     Full                                                        +---------+---------------+---------+-----------+----------+--------------+  Summary: RIGHT: - There is no evidence of deep vein thrombosis in the lower extremity.  - No cystic structure found in the popliteal fossa.  LEFT: - There is no evidence of deep vein thrombosis in the lower extremity.  - No cystic structure found in the popliteal fossa.  *See table(s) above for measurements and observations. Electronically signed by Servando Snare MD on 04/11/2020 at 4:56:04 PM.    Final

## 2020-04-19 NOTE — Progress Notes (Signed)
04/19/2020 patient c/o abdominal pain. Bladder scan showed she had 900 in her bladder. She voided 400cc. Bladder scan rechecked had >478. Dr Candiss Norse made aware and order was give to place a foley cath. [facility]

## 2020-04-19 NOTE — Evaluation (Signed)
Clinical/Bedside Swallow Evaluation Patient Details  Name: AFOMIA BLACKLEY MRN: [id number] Date of Birth: Sep 10, 1940  Today's Date: 04/19/2020 Time: SLP Start Time (ACUTE ONLY): 0930 SLP Stop Time (ACUTE ONLY): 0945 SLP Time Calculation (min) (ACUTE ONLY): 15 min  Past Medical History:  Past Medical History:  Diagnosis Date  . DEPRESSION   . DIVERTICULOSIS, COLON   . DYSLIPIDEMIA   . GERD   . HYPERTENSION   . MIGRAINE HEADACHE   . Multinodular goiter   . NONSPECIFIC ABNORMAL ELECTROCARDIOGRAM 09/15/2010   normal stress test 09/2010  . PLANTAR FASCIITIS   . SPINAL STENOSIS    Past Surgical History:  Past Surgical History:  Procedure Laterality Date  . CATARACT EXTRACTION Left 07/05/13   [facility]  . CATARACT EXTRACTION  winter 2015  . child birth     x's 2 61 & 63  . TONSILLECTOMY     HPI:  AMBERIA BAYLESS is a 79 y.o. female with medical history significant of hypertension, migraine headache, GERD, hyperlipidemia presents to emergency department with worsening shortness of breath.  She is fully vaccinated however she is about 6 months out of her last vaccine shot and is immunocompromised due to being on immunosuppressive medications.     Assessment / Plan / Recommendation Clinical Impression  Pt demonstrates debility with respiratory instability. Despite being on HHFNC and NRB, pt was able to tolerate brief mask removal for over 8 oz of intake of liquids over session without desaturation. Pt also consumed a cup of applesauce, but did not want any of the solids on her tray. Pt had occasional throat clearing throughout which she reported has been constant for her today, prior to PO intake, due to mucous in her throat.  She will need help prior to meals for upright positioning as basic aspriation precautions are even more important given pts difficulty breathing. Pt may continue a soft diet and thin liqquids with assist for se tup and preferably assisted feeding in order to combat fatignue.  Discussed with RN. Will sign off at this time.  SLP Visit Diagnosis: Dysphagia, unspecified (R13.10)    Aspiration Risk  Mild aspiration risk    Diet Recommendation Dysphagia 3 (Mech soft);Thin liquid   Liquid Administration via: Cup;Straw Medication Administration: Whole meds with liquid Supervision: Staff to assist with self feeding Compensations: Slow rate;Small sips/bites Postural Changes: Seated upright at 90 degrees    Other  Recommendations Oral Care Recommendations: Oral care BID Other Recommendations: Have oral suction available   Follow up Recommendations None      Frequency and Duration            Prognosis        Swallow Study   General HPI: DEJANAY WAMBOLDT is a 79 y.o. female with medical history significant of hypertension, migraine headache, GERD, hyperlipidemia presents to emergency department with worsening shortness of breath.  She is fully vaccinated however she is about 6 months out of her last vaccine shot and is immunocompromised due to being on immunosuppressive medications.   Type of Study: Bedside Swallow Evaluation Previous Swallow Assessment: none Diet Prior to this Study: Dysphagia 3 (soft);Thin liquids Temperature Spikes Noted: No Respiratory Status:  (HHFNC, NRB) History of Recent Intubation: No Behavior/Cognition: Alert;Cooperative;Pleasant mood Oral Cavity Assessment: Within Functional Limits Oral Care Completed by SLP: No Oral Cavity - Dentition: Adequate natural dentition Vision: Functional for self-feeding Self-Feeding Abilities: Able to feed self Patient Positioning: Upright in bed Baseline Vocal Quality: Low vocal intensity Volitional Cough: Strong Volitional  Swallow: Able to elicit    Oral/Motor/Sensory Function Overall Oral Motor/Sensory Function: Within functional limits   Ice Chips Ice chips: Not tested   Thin Liquid Thin Liquid: Within functional limits Presentation: Cup;Straw    Nectar Thick Nectar Thick Liquid: Not tested    Honey Thick Honey Thick Liquid: Not tested   Puree Puree: Within functional limits   Solid     Solid: Not tested Other Comments: pt refused     Herbie [place], MA [place] Pager [phone] Office [phone]  Lynann Beaver 04/19/2020,10:14 AM

## 2020-04-20 DIAGNOSIS — Z515 Encounter for palliative care: Secondary | ICD-10-CM

## 2020-04-20 DIAGNOSIS — Z7189 Other specified counseling: Secondary | ICD-10-CM

## 2020-04-20 LAB — CBC WITH DIFFERENTIAL/PLATELET
Abs Immature Granulocytes: 0.74 10*3/uL — ABNORMAL HIGH (ref 0.00–0.07)
Basophils Absolute: 0.1 10*3/uL (ref 0.0–0.1)
Basophils Relative: 0 %
Eosinophils Absolute: 0 10*3/uL (ref 0.0–0.5)
Eosinophils Relative: 0 %
HCT: 35.9 % — ABNORMAL LOW (ref 36.0–46.0)
Hemoglobin: 11.5 g/dL — ABNORMAL LOW (ref 12.0–15.0)
Immature Granulocytes: 3 %
Lymphocytes Relative: 2 %
Lymphs Abs: 0.5 10*3/uL — ABNORMAL LOW (ref 0.7–4.0)
MCH: 30 pg (ref 26.0–34.0)
MCHC: 32 g/dL (ref 30.0–36.0)
MCV: 93.7 fL (ref 80.0–100.0)
Monocytes Absolute: 0.5 10*3/uL (ref 0.1–1.0)
Monocytes Relative: 2 %
Neutro Abs: 22.6 10*3/uL — ABNORMAL HIGH (ref 1.7–7.7)
Neutrophils Relative %: 93 %
Platelets: 548 10*3/uL — ABNORMAL HIGH (ref 150–400)
RBC: 3.83 MIL/uL — ABNORMAL LOW (ref 3.87–5.11)
RDW: 12.9 % (ref 11.5–15.5)
WBC: 24.4 10*3/uL — ABNORMAL HIGH (ref 4.0–10.5)
nRBC: 0 % (ref 0.0–0.2)

## 2020-04-20 LAB — MAGNESIUM: Magnesium: 2.6 mg/dL — ABNORMAL HIGH (ref 1.7–2.4)

## 2020-04-20 LAB — PROCALCITONIN: Procalcitonin: <0.1 ng/mL

## 2020-04-20 LAB — C-REACTIVE PROTEIN: CRP: 2.2 mg/dL — ABNORMAL HIGH (ref <1.0)

## 2020-04-20 LAB — BRAIN NATRIURETIC PEPTIDE: B Natriuretic Peptide: 65.4 pg/mL (ref 0.0–100.0)

## 2020-04-20 MED ORDER — FUROSEMIDE 10 MG/ML IJ SOLN
40.0000 mg | Freq: Once | INTRAMUSCULAR | Status: AC
  Administered 2020-04-20: 40 mg via INTRAVENOUS
  Filled 2020-04-20: qty 4

## 2020-04-20 MED ORDER — ACETAMINOPHEN 650 MG RE SUPP: 650.0000 mg | Freq: Four times a day (QID) | RECTAL | Status: DC | PRN

## 2020-04-20 MED ORDER — HYPROMELLOSE (GONIOSCOPIC) 2.5 % OP SOLN
1.0000 [drp] | Freq: Four times a day (QID) | OPHTHALMIC | Status: DC | PRN
  Filled 2020-04-20: qty 15

## 2020-04-20 MED ORDER — BIOTENE DRY MOUTH MT LIQD: 15.0000 mL | OROMUCOSAL | Status: DC | PRN

## 2020-04-20 MED ORDER — LORAZEPAM 2 MG/ML IJ SOLN
0.5000 mg | INTRAMUSCULAR | Status: DC | PRN
  Administered 2020-04-21: 1 mg via INTRAVENOUS
  Filled 2020-04-20: qty 1

## 2020-04-20 MED ORDER — BISACODYL 10 MG RE SUPP: 10.0000 mg | Freq: Every day | RECTAL | Status: DC | PRN

## 2020-04-20 MED ORDER — SCOPOLAMINE 1 MG/3DAYS TD PT72
1.0000 | MEDICATED_PATCH | TRANSDERMAL | Status: DC
  Administered 2020-04-20 – 2020-04-23 (×2): 1.5 mg via TRANSDERMAL
  Filled 2020-04-20 (×2): qty 1

## 2020-04-20 MED ORDER — MORPHINE 100MG IN NS 100ML (1MG/ML) PREMIX INFUSION
4.0000 mg/h | INTRAVENOUS | Status: DC
  Administered 2020-04-20: 1 mg/h via INTRAVENOUS
  Administered 2020-04-22 – 2020-04-24 (×2): 4 mg/h via INTRAVENOUS
  Administered 2020-04-25: 5 mg/h via INTRAVENOUS
  Administered 2020-04-25: 4 mg/h via INTRAVENOUS
  Administered 2020-04-26: 5 mg/h via INTRAVENOUS
  Filled 2020-04-20 (×5): qty 100

## 2020-04-20 MED ORDER — MORPHINE SULFATE (PF) 2 MG/ML IV SOLN
2.0000 mg | Freq: Once | INTRAVENOUS | Status: AC
  Administered 2020-04-20: 2 mg via INTRAVENOUS
  Filled 2020-04-20: qty 1

## 2020-04-20 NOTE — Social Work (Signed)
CSW reached out to pts grand daughter Cyril Mourning about caregiver support resources. CSW left message on [employer]. CSW tubed resources to nurse to give to pt.   Emeterio Reeve, Latanya Presser, [place] Social Worker [phone]

## 2020-04-20 NOTE — Progress Notes (Signed)
OT Cancellation/Discharge Note  Patient Details Name: [name] MRN: [id number] DOB: 1941/03/23   Cancelled Treatment:    Reason Eval/Treat Not Completed: Medical issues which prohibited therapy.  Noted that pt is transitioning to comfort care.  OT will sign off.  Nilsa Nutting., OTR/L Acute Rehabilitation Services Pager [phone] Office [phone]   Lucille Passy M 04/20/2020, 12:47 PM

## 2020-04-20 NOTE — Progress Notes (Signed)
PROGRESS NOTE                                                                                                                                                                                                             Patient Demographics:    [name], is a 79 y.o. female, DOB - 1941/02/01, GYK:[id number]  Outpatient Primary MD for the patient is [name], MD    LOS - 11  Admit date - 03/19/2020    Chief Complaint  Patient presents with  . Shortness of Breath       Brief Narrative - Cassandra A Suttonis a 79 y.o.femalewith medical history significant ofhypertension, migraine headache, GERD, hyperlipidemia presents to emergency department with worsening shortness of breath.  Patient is fully vaccinated, she presents with COVID-19 symptoms, she was noted to be hypoxic, admitted for further work-up , unfortunately she kept having increased oxygen requirement. 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 No history of smoking, alcohol, licit drug use.    Subjective:   Patient in bed, appears comfortable, denies any headache, no fever, no chest pain or pressure, no shortness of breath , no abdominal pain. No focal weakness.   Assessment  & Plan :     1. Acute Hypoxic Resp. Failure due to Acute Covid 19 Viral Pneumonitis during the ongoing 2020 Covid 19 Pandemic, she is fully vaccinated and seems to have breakthrough infection due to waning immunity - she has incurred severe parenchymal lung injury, she is severely hypoxic and being treated with combination of IV steroids, remdesivir and Baricitinib. Quite tenuous on 60 L of heated high flow along + nonrebreather. CTA ruled out PE.  We are diuresing as needed basis with IV Lasix along with Unasyn for possible subclinical aspiration.   Note despite full scope of aggressive treatment and exhausting all measures she  continues to be extremely hypoxic requiring 60 L of heated high flow oxygen along with nonrebreather mask and is gradually declining, I have explained to patient and granddaughter, at this point I think she is starting to suffer, patient realizes that she is dying, she remains DNR.  Will involve palliative care for goals of care.  Possibly transitioning to comfort care would not be a bad idea at this point as I think patient now is suffering without any  clinical recovery.   Detailed discussions with patient and patient's granddaughter, patient wishes not to be on a ventilator, if she further declines she wants to be made comfortable.  This was also communicated to the daughter and she is agreeable.  If there is further decline despite the maximum treatment that she is getting we will transition to comfort care.   SpO2: (!) 89 % O2 Flow Rate (L/min): 60 L/min FiO2 (%): 100 %  Recent Labs  Lab 04/15/20 0432 04/15/20 0432 04/16/20 0629 04/16/20 0718 04/16/20 0723 04/17/20 1336 04/17/20 1337 04/18/20 1220 04/19/20 0132 04/19/20 0138 04/20/20 0457  WBC 20.3*   < > 21.0*  --   --  24.6*  --  31.2*  --  29.1* 24.4*  PLT 395   < > 447*  --   --  528*  --  653*  --  525* 548*  CRP 6.5*  --  9.5*  --   --  4.6*  --   --   --  5.5* 2.2*  BNP  --   --   --  66.6  --   --  54.0 48.2 100.1*  --  65.4  DDIMER >20.00*  --  14.11*  --   --  9.04*  --  6.29*  --  3.17*  --   PROCALCITON 0.12   < >  --   --  <0.10 <0.10  --  <0.10  --  <0.10 <0.10  AST 31  --  32  --   --  39  --  37  --  34  --   ALT 89*  --  73*  --   --  75*  --  66*  --  58*  --   ALKPHOS 95  --  98  --   --  113  --  133*  --  119  --   BILITOT 0.8  --  0.5  --   --  1.0  --  0.6  --  0.8  --   ALBUMIN 2.4*  --  2.4*  --   --  2.6*  --  2.7*  --  2.4*  --    < > = values in this interval not displayed.      2. Extremely elevated D-dimer. With negative CTA and leg ultrasound. On full dose Lovenox. This will be continued till  D-dimer dips below 2 as the risk of developing a clot in the future is extremely high due to intense inflammation.  3. Asymptomatic transaminitis due to COVID-19 viral infection. Stable ultrasound showing fatty liver. Monitor trend which is improving.  4. Abdominal pain. Resolved. Likely due to constipation, stable abdominal ultrasound.  5. Essential hypertension. Currently stable off of medications.  6. GERD. On PPI.  7. Dyslipidemia. On statin.  8. HX of chronic back pain and migraine headaches. On combination of tramadol and Imitrex.    Condition - Extremely Guarded  Family Communication  :  granddaughter Cyril Mourning [phone] on 04/16/20, 04/17/20 (patient left at 11:51 AM on 9/2/202) -  guarded prognosis & now DNR communicated. 04/18/20, 04/19/20-plan again poor prognosis and transition to comfort measures if patient starts to suffer.  Code Status : DNR  Consults  :  None  Procedures  :    CTA -  1. No evidence of acute pulmonary embolism. 2. Multifocal areas of mixed ground-glass and consolidative opacity with a peripheral predominance with more dense opacification in the dependent portions of the lungs  likely reflecting some superimposed atelectatic changes. Findings are favored to represent multifocal pneumonia in the setting of COVID-19 positivity. 3. Trace left pleural effusion. 4. Aortic Atherosclerosis (ICD10-I70.0). 5. Coronary artery calcifications are present. Please note that the presence of coronary artery calcium documents the presence of coronary artery disease, the severity of this disease and any potential stenosis cannot be assessed on this non-gated CT examination.  Leg [place] -  RIGHT: - There is no evidence of deep vein thrombosis in the lower extremity.  - No cystic structure found in the popliteal fossa.  LEFT: - There is no evidence of deep vein thrombosis in the lower extremity.  - No cystic structure found in the popliteal fossa.  Abd [place] - 1. Negative for gallstones or  biliary dilatation 2. Echogenic liver consistent with steatosis and or hepatocellular disease. 3. Simple appearing left renal cyst   PUD Prophylaxis : None  Disposition Plan  :    Status is: Inpatient  Remains inpatient appropriate because:IV treatments appropriate due to intensity of illness or inability to take PO   Dispo: The patient is from: Home              Anticipated d/c is to: Home              Anticipated d/c date is: > 3 days              Patient currently is not medically stable to d/c.   DVT Prophylaxis  :  Lovenox    Lab Results  Component Value Date   PLT 548 (H) 04/20/2020    Diet :  Diet Order            DIET SOFT Room service appropriate? Yes; Fluid consistency: Thin  Diet effective now                  Inpatient Medications  Scheduled Meds: . AeroChamber Plus Flo-Vu Large  1 each Other Once  . albuterol  2 puff Inhalation Q6H  . vitamin C  500 mg Oral Daily  . atorvastatin  20 mg Oral Daily  . baricitinib  2 mg Oral Daily  . Chlorhexidine Gluconate Cloth  6 each Topical Daily  . enoxaparin (LOVENOX) injection  70 mg Subcutaneous Q12H  . feeding supplement (ENSURE ENLIVE)  237 mL Oral BID BM  . gabapentin  300 mg Oral BID  . hydroxychloroquine  200 mg Oral BID  . methylPREDNISolone (SOLU-MEDROL) injection  40 mg Intravenous BID  . pantoprazole  40 mg Oral Daily  . polyethylene glycol  17 g Oral BID  . zinc sulfate  220 mg Oral Daily   Continuous Infusions: . ampicillin-sulbactam (UNASYN) IV 3 g (04/20/20 0415)   PRN Meds:.acetaminophen, chlorpheniramine-HYDROcodone, guaiFENesin-dextromethorphan, [DISCONTINUED] ondansetron **OR** ondansetron (ZOFRAN) IV, traMADol  Antibiotics  :    Anti-infectives (From admission, onward)   Start     Dose/Rate Route Frequency Ordered Stop   04/17/20 1630  Ampicillin-Sulbactam (UNASYN) 3 g in sodium chloride 0.9 % 100 mL IVPB        3 g 200 mL/hr over 30 Minutes Intravenous Every 8 hours 04/17/20 1526       04/13/20 1200  remdesivir 100 mg in sodium chloride 0.9 % 100 mL IVPB        100 mg 200 mL/hr over 30 Minutes Intravenous Daily 04/13/20 1150 04/16/20 1123   04/13/20 1100  remdesivir injection 100 mg  Status:  Discontinued        100  mg Intravenous Every 24 hours 04/13/20 0957 04/13/20 1153   04/12/20 1430  azithromycin (ZITHROMAX) tablet 500 mg        500 mg Oral Daily 04/12/20 1321 04/13/20 1218   04/10/20 1000  remdesivir 100 mg in sodium chloride 0.9 % 100 mL IVPB  Status:  Discontinued       "Followed by" Linked Group Details   100 mg 200 mL/hr over 30 Minutes Intravenous Daily 03/18/2020 1551 04/10/20 0800   04/04/2020 2200  hydroxychloroquine (PLAQUENIL) tablet 200 mg        200 mg Oral 2 times daily 04/06/2020 1653     04/04/2020 1600  remdesivir 200 mg in sodium chloride 0.9% 250 mL IVPB       "Followed by" Linked Group Details   200 mg 580 mL/hr over 30 Minutes Intravenous Once 03/18/2020 1551 04/14/2020 2139   03/22/2020 1600  azithromycin (ZITHROMAX) 500 mg in sodium chloride 0.9 % 250 mL IVPB  Status:  Discontinued        500 mg 250 mL/hr over 60 Minutes Intravenous Every 24 hours 03/18/2020 1552 04/12/20 1321   03/29/2020 1600  cefTRIAXone (ROCEPHIN) 1 g in sodium chloride 0.9 % 100 mL IVPB        1 g 200 mL/hr over 30 Minutes Intravenous Every 24 hours 04/11/2020 1552 04/13/20 1641       Time Spent in minutes  30   Lala Lund M.D on 04/20/2020 at 9:42 AM  To page go to [email or web address] - password [facility]  [facility] -  Office  [phone]   See all Orders from today for further details    Objective:   Vitals:   04/20/20 0300 04/20/20 0318 04/20/20 0751 04/20/20 0840  BP:  101/78    Pulse:  85    Resp:  20    Temp: 98.1 F (36.7 C) 98.6 F (37 C) 98.1 F (36.7 C)   TempSrc: Oral Axillary Oral   SpO2:  93%  (!) 89%  Weight:      Height:        Wt Readings from Last 3 Encounters:  04/10/20 73 kg  10/22/19 73.4 kg  04/16/19 73.9 kg     Intake/Output  Summary (Last 24 hours) at 04/20/2020 0942 Last data filed at 04/19/2020 2010 Gross per 24 hour  Intake --  Output 700 ml  Net -700 ml     Physical Exam  Awake Alert, No new F.N deficits,  [place].AT,PERRAL Supple Neck,No JVD, No cervical lymphadenopathy appriciated.  Symmetrical Chest wall movement, Good air movement bilaterally, +ve rales RRR,No Gallops, Rubs or new Murmurs, No Parasternal Heave +ve B.Sounds, Abd Soft, No tenderness, No organomegaly appriciated, No rebound - guarding or rigidity. No Cyanosis, Clubbing or edema, No new Rash or bruise     Data Review:    CBC Recent Labs  Lab 04/14/20 0310 04/15/20 0432 04/16/20 0629 04/17/20 1336 04/18/20 1220 04/19/20 0138 04/20/20 0457  WBC 19.4*   < > 21.0* 24.6* 31.2* 29.1* 24.4*  HGB 12.5   < > 11.9* 12.6 13.3 12.9 11.5*  HCT 38.9   < > 37.8 39.4 41.6 41.2 35.9*  PLT 361   < > 447* 528* 653* 525* 548*  MCV 90.7   < > 92.0 93.6 93.7 92.6 93.7  MCH 29.1   < > 29.0 29.9 30.0 29.0 30.0  MCHC 32.1   < > 31.5 32.0 32.0 31.3 32.0  RDW 12.9   < > 13.1 13.2 13.2 13.1 12.9  LYMPHSABS 0.6*  --   --   --   --   --  0.5*  MONOABS 0.5  --   --   --   --   --  0.5  EOSABS 0.0  --   --   --   --   --  0.0  BASOSABS 0.1  --   --   --   --   --  0.1   < > = values in this interval not displayed.    Recent Labs  Lab  0000 04/15/20 0432 04/15/20 0432 04/16/20 0629 04/16/20 0718 04/16/20 0723 04/17/20 1336 04/17/20 1337 04/18/20 1220 04/19/20 0132 04/19/20 0138 04/20/20 0457  NA  --  140  --  141  --   --  141  --  140  --  140  --   K  --  4.1  --  4.4  --   --  4.4  --  4.5  --  5.1  --   CL  --  101  --  103  --   --  104  --  102  --  103  --   CO2  --  26  --  24  --   --  22  --  25  --  22  --   GLUCOSE  --  154*  --  127*  --   --  124*  --  115*  --  161*  --   BUN  --  25*  --  29*  --   --  27*  --  33*  --  35*  --   CREATININE  --  0.95  --  0.88  --   --  0.92  --  1.08*  --  1.01*  --   CALCIUM  --  8.7*   --  8.6*  --   --  8.6*  --  9.0  --  8.8*  --   AST  --  31  --  32  --   --  39  --  37  --  34  --   ALT  --  89*  --  73*  --   --  75*  --  66*  --  58*  --   ALKPHOS  --  95  --  98  --   --  113  --  133*  --  119  --   BILITOT  --  0.8  --  0.5  --   --  1.0  --  0.6  --  0.8  --   ALBUMIN  --  2.4*  --  2.4*  --   --  2.6*  --  2.7*  --  2.4*  --   MG   < >  --   --   --   --  2.6* 2.5*  --  2.7*  --  2.7* 2.6*  CRP  --  6.5*  --  9.5*  --   --  4.6*  --   --   --  5.5* 2.2*  DDIMER  --  >20.00*  --  14.11*  --   --  9.04*  --  6.29*  --  3.17*  --   PROCALCITON  --  0.12   < >  --   --  <0.10 <0.10  --  <0.10  --  <0.10 <0.10  BNP  --   --   --   --  66.6  --   --  54.0 48.2 100.1*  --  65.4   < > = values in this interval not displayed.    ------------------------------------------------------------------------------------------------------------------ No results for input(s): CHOL, HDL, LDLCALC, TRIG, CHOLHDL, LDLDIRECT in the last 72 hours.  Lab Results  Component Value Date   HGBA1C 5.6 10/22/2019   ------------------------------------------------------------------------------------------------------------------ No results for input(s): TSH, T4TOTAL, T3FREE, THYROIDAB in the last 72 hours.  Invalid input(s): FREET3  Cardiac Enzymes No results for input(s): CKMB, TROPONINI, MYOGLOBIN in the last 168 hours.  Invalid input(s): CK ------------------------------------------------------------------------------------------------------------------    Component Value Date/Time   BNP 65.4 04/20/2020 0457    Micro Results Recent Results (from the past 240 hour(s))  MRSA PCR Screening     Status: None   Collection Time: 04/17/20  3:14 PM   Specimen: Nasal Mucosa; Nasopharyngeal  Result Value Ref Range Status   MRSA by PCR NEGATIVE NEGATIVE Final    Comment:        The GeneXpert MRSA Assay (FDA approved for NASAL specimens only), is one component of a comprehensive  MRSA colonization surveillance program. It is not intended to diagnose MRSA infection nor to guide or monitor treatment for MRSA infections. Performed at [facility], [place] [address] [place], [place] [address]     Radiology Reports CT ANGIO CHEST PE W OR WO CONTRAST  Result Date: 04/15/2020 CLINICAL DATA:  PE suspected, COVID-19 positive EXAM: CT ANGIOGRAPHY CHEST WITH CONTRAST TECHNIQUE: Multidetector CT imaging of the chest was performed using the standard protocol during bolus administration of intravenous contrast. Multiplanar CT image reconstructions and MIPs were obtained to evaluate the vascular anatomy. CONTRAST:  25mL OMNIPAQUE IOHEXOL 350 MG/ML SOLN COMPARISON:  Radiograph 03/21/2020 FINDINGS: Cardiovascular: Satisfactory opacification the pulmonary arteries to the segmental level. No pulmonary artery filling defects are identified. Central pulmonary arteries are normal caliber. Normal heart size. No pericardial effusion. Scattered coronary artery calcifications are present. Atherosclerotic plaque within the normal caliber aorta. No acute luminal abnormality nor periaortic stranding hemorrhage. Shared origin of the brachiocephalic and left common carotid artery. Minimal plaque in the otherwise normal proximal great vessels. Mediastinum/Nodes: Few scattered low-attenuation subcentimeter mediastinal and hilar nodes are present. No enlarged mediastinal, hilar or axillary adenopathy. No acute abnormality of the trachea or esophagus. Thyroid gland demonstrates few subcentimeter hypoattenuating foci, without significant enlargement of the gland. No further imaging follow-up is warranted in a patient of this age (Reference: J Am Coll Radiol. 2015 Feb;12(2): 143-50). Lungs/Pleura: There are multifocal areas of mixed ground-glass and consolidative opacity with a peripheral predominance with more dense opacification in the dependent portions of the lungs likely reflecting some superimposed  atelectatic changes. There is a trace left effusion as well. No pneumothorax or right effusion. Small air cyst seen in the right lung base (5/67). Upper Abdomen: No acute abnormalities present in the visualized portions of the upper abdomen. Small accessory splenule Musculoskeletal: Multilevel discogenic and facet degenerative changes, maximal C5-6 where sclerotic, likely Modic type endplate changes are present with some mild retrolisthesis. Exaggerated thoracic kyphosis is noted with a slight pectus deformity of the chest (Haller index 2.55). No acute or suspicious osseous lesions. Review of the MIP images confirms the above findings. IMPRESSION: 1. No evidence of acute pulmonary embolism. 2. Multifocal areas of mixed ground-glass and consolidative opacity with a peripheral predominance with more dense opacification in the dependent portions of the lungs likely reflecting some superimposed atelectatic changes. Findings are favored to represent multifocal pneumonia in the setting of COVID-19  positivity. 3. Trace left pleural effusion. 4. Aortic Atherosclerosis (ICD10-I70.0). 5. Coronary artery calcifications are present. Please note that the presence of coronary artery calcium documents the presence of coronary artery disease, the severity of this disease and any potential stenosis cannot be assessed on this non-gated CT examination. Electronically Signed   By: Lovena Le M.D.   On: 04/15/2020 16:00   US Abdomen Complete  Result Date: 04/10/2020 CLINICAL DATA:  Epigastric pain EXAM: ABDOMEN ULTRASOUND COMPLETE COMPARISON:  None. FINDINGS: Gallbladder: No gallstones or wall thickening visualized. No sonographic Murphy sign noted by sonographer. Common bile duct: Diameter: 3.3 mm Liver: Liver is echogenic. No focal hepatic abnormality. Portal vein is patent on color Doppler imaging with normal direction of blood flow towards the liver. IVC: No abnormality visualized. Pancreas: Visualized portion unremarkable.  Spleen: Size and appearance within normal limits. Right Kidney: Length: 10 cm. Echogenicity within normal limits. No mass or hydronephrosis visualized. Left Kidney: Length: 10.4 cm. Echogenicity within normal limits. No hydronephrosis. Cyst off the lower pole measuring 3.1 cm. Abdominal aorta: No aneurysm visualized. Other findings: None. IMPRESSION: 1. Negative for gallstones or biliary dilatation 2. Echogenic liver consistent with steatosis and or hepatocellular disease. 3. Simple appearing left renal cyst Electronically Signed   By: Donavan Foil M.D.   On: 04/10/2020 18:12   DG Chest Port 1 View  Result Date: 04/19/2020 CLINICAL DATA:  Shortness of breath, COVID positive. EXAM: PORTABLE CHEST 1 VIEW COMPARISON:  Chest x-ray dated 04/17/2020. FINDINGS: Heart size and mediastinal contours are within normal limits. Patchy ground-glass opacities are stable within each lung, again most dense at the RIGHT lung base. No pleural effusion or pneumothorax is seen. IMPRESSION: Stable appearance of the bilateral multifocal pneumonia. Electronically Signed   By: Franki Cabot M.D.   On: 04/19/2020 08:50   DG Chest Port 1 View  Result Date: 04/17/2020 CLINICAL DATA:  Shortness of breath, COVID positive EXAM: PORTABLE CHEST 1 VIEW COMPARISON:  04/16/2020 FINDINGS: No significant change in AP portable chest radiograph with heterogeneous airspace opacities present in the peripheral mid lungs and lung bases. No new airspace opacity. The heart and mediastinum are unremarkable. IMPRESSION: No significant change in AP portable chest radiograph with heterogeneous airspace opacities present in the peripheral mid lungs and lung bases, in keeping with COVID airspace disease. No new airspace opacity. Electronically Signed   By: Eddie Candle M.D.   On: 04/17/2020 10:23   DG Chest Port 1 View  Result Date: 04/16/2020 CLINICAL DATA:  Shortness of breath, COVID-19 positive. EXAM: PORTABLE CHEST 1 VIEW COMPARISON:  April 09, 2020.  FINDINGS: The heart size and mediastinal contours are within normal limits. No pneumothorax or pleural effusion is noted. Stable mild diffuse interstitial reticular densities are noted in the lungs bilaterally concerning for atypical inflammation. The visualized skeletal structures are unremarkable. IMPRESSION: Stable mild diffuse interstitial reticular densities are noted in the lungs bilaterally concerning for atypical inflammation. Electronically Signed   By: Marijo Conception M.D.   On: 04/16/2020 08:13   DG Chest Portable 1 View  Result Date: 04/07/2020 CLINICAL DATA:  Shortness of breath.  COVID-19 exposure EXAM: PORTABLE CHEST 1 VIEW COMPARISON:  01/13/2012 FINDINGS: The heart size and mediastinal contours are within normal limits. Atherosclerotic calcification of the aortic knob. Diffuse increased interstitial markings throughout both lungs. No pleural effusion or pneumothorax. The visualized skeletal structures are unremarkable. IMPRESSION: Diffuse increased interstitial markings throughout both lungs, which may reflect edema versus atypical/viral infection. Electronically Signed   By:  Nicholas  Plundo D.O.   On: 04/10/2020 13:36   DG Abd Portable 1V  Result Date: 04/10/2020 CLINICAL DATA:  Abdominal pain. EXAM: PORTABLE ABDOMEN - 1 VIEW COMPARISON:  None. FINDINGS: The bowel gas pattern is normal. No radio-opaque calculi or other significant radiographic abnormality are seen. IMPRESSION: Negative. Electronically Signed   By: Marijo Conception M.D.   On: 04/10/2020 15:17   VAS [place] LOWER EXTREMITY VENOUS (DVT)  Result Date: 04/15/2020  Lower Venous DVTStudy Indications: Elevated d dimer >20, aware of recent negative doppler.  Comparison Study: 04/11/20 previous Performing Technologist: Abram Sander RVS  Examination Guidelines: A complete evaluation includes B-mode imaging, spectral Doppler, color Doppler, and power Doppler as needed of all accessible portions of each vessel. Bilateral testing is  considered an integral part of a complete examination. Limited examinations for reoccurring indications may be performed as noted. The reflux portion of the exam is performed with the patient in reverse Trendelenburg.  +---------+---------------+---------+-----------+----------+--------------+ RIGHT    CompressibilityPhasicitySpontaneityPropertiesThrombus Aging +---------+---------------+---------+-----------+----------+--------------+ CFV      Full           Yes      Yes                                 +---------+---------------+---------+-----------+----------+--------------+ SFJ      Full                                                        +---------+---------------+---------+-----------+----------+--------------+ FV Prox  Full                                                        +---------+---------------+---------+-----------+----------+--------------+ FV Mid   Full                                                        +---------+---------------+---------+-----------+----------+--------------+ FV DistalFull                                                        +---------+---------------+---------+-----------+----------+--------------+ PFV      Full                                                        +---------+---------------+---------+-----------+----------+--------------+ POP      Full           Yes      Yes                                 +---------+---------------+---------+-----------+----------+--------------+ PTV      Full                                                        +---------+---------------+---------+-----------+----------+--------------+  PERO     Full                                                        +---------+---------------+---------+-----------+----------+--------------+   +---------+---------------+---------+-----------+----------+--------------+ LEFT      CompressibilityPhasicitySpontaneityPropertiesThrombus Aging +---------+---------------+---------+-----------+----------+--------------+ CFV      Full           Yes      Yes                                 +---------+---------------+---------+-----------+----------+--------------+ SFJ      Full                                                        +---------+---------------+---------+-----------+----------+--------------+ FV Prox  Full                                                        +---------+---------------+---------+-----------+----------+--------------+ FV Mid   Full                                                        +---------+---------------+---------+-----------+----------+--------------+ FV DistalFull                                                        +---------+---------------+---------+-----------+----------+--------------+ PFV      Full                                                        +---------+---------------+---------+-----------+----------+--------------+ POP      Full           Yes      Yes                                 +---------+---------------+---------+-----------+----------+--------------+ PTV      Full                                                        +---------+---------------+---------+-----------+----------+--------------+ PERO     Full                                                        +---------+---------------+---------+-----------+----------+--------------+       Summary: BILATERAL: - No evidence of deep vein thrombosis seen in the lower extremities, bilaterally. - No evidence of superficial venous thrombosis in the lower extremities, bilaterally. -   *See table(s) above for measurements and observations. Electronically signed by Harold Barban MD on 04/15/2020 at 8:50:57 PM.    Final    VAS [place] LOWER EXTREMITY VENOUS (DVT)  Result Date: 04/11/2020  Lower Venous DVTStudy Indications:  Elevated Ddimer.  Risk Factors: COVID 19 positive. Comparison Study: No prior studies. Performing Technologist: Oliver Hum RVT  Examination Guidelines: A complete evaluation includes B-mode imaging, spectral Doppler, color Doppler, and power Doppler as needed of all accessible portions of each vessel. Bilateral testing is considered an integral part of a complete examination. Limited examinations for reoccurring indications may be performed as noted. The reflux portion of the exam is performed with the patient in reverse Trendelenburg.  +---------+---------------+---------+-----------+----------+--------------+ RIGHT    CompressibilityPhasicitySpontaneityPropertiesThrombus Aging +---------+---------------+---------+-----------+----------+--------------+ CFV      Full           Yes      Yes                                 +---------+---------------+---------+-----------+----------+--------------+ SFJ      Full                                                        +---------+---------------+---------+-----------+----------+--------------+ FV Prox  Full                                                        +---------+---------------+---------+-----------+----------+--------------+ FV Mid   Full                                                        +---------+---------------+---------+-----------+----------+--------------+ FV DistalFull                                                        +---------+---------------+---------+-----------+----------+--------------+ PFV      Full                                                        +---------+---------------+---------+-----------+----------+--------------+ POP      Full           Yes      Yes                                 +---------+---------------+---------+-----------+----------+--------------+ PTV      Full                                                         +---------+---------------+---------+-----------+----------+--------------+  PERO     Full                                                        +---------+---------------+---------+-----------+----------+--------------+   +---------+---------------+---------+-----------+----------+--------------+ LEFT     CompressibilityPhasicitySpontaneityPropertiesThrombus Aging +---------+---------------+---------+-----------+----------+--------------+ CFV      Full           Yes      Yes                                 +---------+---------------+---------+-----------+----------+--------------+ SFJ      Full                                                        +---------+---------------+---------+-----------+----------+--------------+ FV Prox  Full                                                        +---------+---------------+---------+-----------+----------+--------------+ FV Mid   Full                                                        +---------+---------------+---------+-----------+----------+--------------+ FV DistalFull                                                        +---------+---------------+---------+-----------+----------+--------------+ PFV      Full                                                        +---------+---------------+---------+-----------+----------+--------------+ POP      Full           Yes      Yes                                 +---------+---------------+---------+-----------+----------+--------------+ PTV      Full                                                        +---------+---------------+---------+-----------+----------+--------------+ PERO     Full                                                        +---------+---------------+---------+-----------+----------+--------------+  Summary: RIGHT: - There is no evidence of deep vein thrombosis in the lower extremity.  - No cystic structure found in  the popliteal fossa.  LEFT: - There is no evidence of deep vein thrombosis in the lower extremity.  - No cystic structure found in the popliteal fossa.  *See table(s) above for measurements and observations. Electronically signed by Servando Snare MD on 04/11/2020 at 4:56:04 PM.    Final

## 2020-04-20 NOTE — Consult Note (Addendum)
Palliative Medicine Inpatient Consult Note  Reason for consult:  Goals of Care "DNR, advanced COVID-19 pneumonia, not improving, question transition to full comfort measures"  HPI:  Per intake H&P --> Cassandra A Suttonis a 79 y.o.femalewith medical history significant ofhypertension, migraine headache, GERD, hyperlipidemia presents to emergency department with worsening shortness of breath.  Patient is fully vaccinated, she presents with COVID-19 symptoms, she was noted to be hypoxic, admitted for further work-up,unfortunately she kept having increased oxygen requirement. 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 No history of smoking, alcohol, licit drug use.  Palliative care was asked to get involved to aid in [name] in the setting of a ten day hospitalization with increasing O2 needs and no improvements.  Clinical Assessment/Goals of Care: I have reviewed medical records including EPIC notes, labs and imaging, received report from bedside RN, assessed the patient who was alert and oriented. Noted to be maximized on HFNC and NRB FM.   I met with [name] to further discuss diagnosis prognosis, GOC, EOL wishes, disposition and options.   I introduced Palliative Medicine as specialized medical care for people living with serious illness. It focuses on providing relief from the symptoms and stress of a serious illness. The goal is to improve quality of life for both the patient and the family.  I asked [name] to tell me about herself. She shares that [name] she has lived in [name] for the greater portion of her life. She has a husband who suffers from dementia - he is presently residing at [name] post hospitalization. They share two sons together - [name] and [name]. 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 She is a woman of strong faith and is of the [name]  denomination.  Prior August 26th [name] was fully functional performing bADLS and iADLS for herself. She shares that she had been exposed to [name] about a week before that through her son. She then started feeling weak and unable to perform her normal tasks due to feeling short of breath.   Upon Norris admission [name] shares that she was optimistic for improvement in her condition. I asked her if she feels that she has improved and she states that she does not. She shares that she was hoping for a "miracle." I shared with [name] that sometimes miracles can present in different ways and sometime the ability to pass on from this world comfortably is a miracle in and of itself. She agreed with this. I shared with her the concerns of the medical team that she is not improving despite maximum doses of therapy. I shared that in time I worry her body will lose its ability to maintain any sense of stability and she will further decline possibly leading to worsening distress.   A detailed discussion was had today regarding advanced directives - there are none on file though [name] shares that if unable to make decisions for herself she would rely on her son's and granddaughter to make decisions for her.    Concepts specific to code status, artifical feeding and hydration, continued IV antibiotics and rehospitalization was had.  The difference between a aggressive medical intervention path  and a palliative comfort care path for this patient at this time was had.I shared with [name] that we have two options - continue the present path or to transition our focus to valuing the time that is left and optimizing  symptom relief knowing that this would result in a very short amount of living time left. She shares that she has not been improving and would elect to be made comfortable. We talked about transition to comfort measures in house and what that would entail inclusive of medications to control pain, dyspnea, agitation,  nausea, itching, and hiccups.  We discussed stopping all uneccessary measures such as blood draws, needle sticks, and frequent vital signs. She is tearful during our conversation. I was able to offer support through listening.   Discussed the importance of continued conversation with family and their  medical providers regarding overall plan of care and treatment options, ensuring decisions are within the context of the patients values and GOCs. ________________________________________ Addendum: I had a conversation with  [name] (grandaughter) this morning. I shared with her the conversation [name] and I had. She was very emotionally overwhelmed. She shared that she has the stress of this and the stress of her father and uncle who battle polysubstance abuse. She expresses that she has had a terrible time as they have not been present during this very trial some period in [place] life. She states worry that they are using her assets while she is on her "death bed." I stated that I will request for our CSW to follow up with her. We discussed her visiting with her grandmother for fifteen minutes this afternoon.  I was able to then call patients son, [name] to inform him of the conversation I had with [name]. He expressed understanding.  I called patients other son, [name] though he did not answer and his VM is not set up. I will try again as the day goes on. ___________________________________________________ Addendum: I was able to get into contact with patients son [name] and update him on his mothers status. He will not be able to come in due to testing positive for COVID-19. I offerred FT visit if he desires.  Decision Maker: Patient can make decisions for herself  SUMMARY OF RECOMMENDATIONS   DNAR/DNI  Transition to comfort measures  Spiritual Support  CSW - Help granddaughter obtain resources for the various stressors which are present in her life right now   Code Status/Advance Care  Planning: DNAR/DNI  Symptom Management:  Dyspnea: Pain:  - Morphine gtt w/ boluses Fever:  - Tylenol 6611m PO/PR Q6H PRN Agitation: Anxiety:  - Lorazepam 0.5-186m IV  Q1H PRN Nausea:  - Zofran 11m62m Q6H PRN  Secretions:  - Scopolamine Patch Dry Eyes:  - Artificial Tears PRN Xerostomia:  - Biotene 31m63mN  - BID oral care Urinary Retention:  - Maintain foley  Constipation:  - Bisacodyl 10mg36mPRN QDay Spiritual:  - Chaplain consult  Palliative Prophylaxis:   Oral Care, Turn Q2H, Dyspnea, Aspiration  Additional Recommendations (Limitations, Scope, Preferences):  Comfort Oriented Care   Psycho-social/Spiritual:   Desire for further Chaplaincy support: Yes - [employer]  Additional Recommendations: Education on ed of life care   Prognosis: Hours to days  Discharge Planning: Discharge will be celetial  PPS: 20%   This conversation/these recommendations were discussed with patient primary care team, Dr. SinghCandiss Norsee In: 0945 Time Out: 1115 Total Time: 90 Greater than 50%  of this time was spent counseling and coordinating care related to the above assessment and plan.  [facility] Team Cell Phone: [phone] utilize secure chat with additional questions, if there is no response within 30 minutes please call the above phone  number  Palliative Medicine Team providers are available by phone from 7am to 7pm daily and can be reached through the team cell phone.  Should this patient require assistance outside of these hours, please call the patient's attending physician.

## 2020-04-20 NOTE — Consult Note (Signed)
Responded to page, spoke with pt's nurse. She suggested it may be best if I visit pt this afternoon when pt's granddaughter is here, as I could  offer support to both. She will call again then.  Rev. Eloise Levels Chaplain

## 2020-04-20 NOTE — Progress Notes (Signed)
Patient has transitioned to comfort care per RN. RT at bedside to removed/wean oxygen per comfort measures as patient is on 70L 100% and NRB. Spoke with RN about removing O2 however  Patient is alert and coherent. RT weaned FIO2 to 50% and 20L at this time as does not want patient to struggle of have respiratory distress.  RN aware.

## 2020-04-20 NOTE — Consult Note (Signed)
Responded to nurse's call that pt granddaughter [name]) was now bedside. She'd come .from [place]. Pt is the decision-maker, and is transitioning to comfort care (has covid).[name] spiritual/ emotional support and prayer to pt and family--which they appreciated. Pt is [name] and has strong faith. Near end of prayer, one of pt's sons joined [place], also to visit with pt bedside. Please call again if pt desires further chaplain services. Will leave msg for F/U with day chaplain.  Rev. [name] Chaplain

## 2020-04-21 ENCOUNTER — Encounter (HOSPITAL_COMMUNITY): Payer: Self-pay | Admitting: Internal Medicine

## 2020-04-21 MED ORDER — MORPHINE BOLUS VIA INFUSION
1.0000 mg | INTRAVENOUS | Status: DC | PRN
  Filled 2020-04-21: qty 2

## 2020-04-21 NOTE — Progress Notes (Addendum)
PROGRESS NOTE                                                                                                                                                                                                             Patient Demographics:    [name], is a 79 y.o. female, DOB - [birth year], XQJ:[id number]  Outpatient Primary MD for the patient is [name], MD    LOS - 12  Admit date - 04/05/2020    Chief Complaint  Patient presents with  . Shortness of Breath       Brief Narrative - Cassandra A Suttonis a 79 y.o.femalewith medical history significant ofhypertension, migraine headache, GERD, hyperlipidemia presents to emergency department with worsening shortness of breath.  Patient is fully vaccinated, she presents with COVID-19 symptoms, she was noted to be hypoxic, admitted for further work-up , unfortunately she kept having increased oxygen requirement. 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 No history of smoking, alcohol, licit drug use.    Subjective:   Patient in bed, appears comfortable, denies any headache, no fever, no chest pain or pressure, no shortness of breath , no abdominal pain. No focal weakness.   Assessment  & Plan :     Note despite full medical care for aggressive COVID-19 pneumonia and acute hypoxic respiratory failure patient continued to decline, she has been transitioned to full comfort measures on April 27, 2020.  Expect hospital death.  Continue comfort measures along with as much oxygen as needed to keep her comfortable.   Other medical issues addressed this hospital admission are below.    1. Acute Hypoxic Resp. Failure due to Acute Covid 19 Viral Pneumonitis during the ongoing 2020 Covid 19 Pandemic, she is fully vaccinated and seems to have breakthrough infection due to waning immunity - she has incurred severe parenchymal lung  injury, she is severely hypoxic and being treated with combination of IV steroids, remdesivir and Baricitinib. Quite tenuous on 60 L of heated high flow along + nonrebreather. CTA ruled out PE.  We are diuresing as needed basis with IV Lasix along with Unasyn for possible subclinical aspiration.   Note despite full scope of aggressive treatment and exhausting all measures she continues to be extremely hypoxic requiring 60 L of heated high flow oxygen along with nonrebreather mask and is  gradually declining, I have explained to patient and granddaughter, at this point I think she is starting to suffer, patient realizes that she is dying, she remains DNR.  Will involve palliative care for goals of care.  Possibly transitioning to comfort care would not be a bad idea at this point as I think patient now is suffering without any clinical recovery.   Detailed discussions with patient and patient's granddaughter, patient wishes not to be on a ventilator, if she further declines she wants to be made comfortable.  This was also communicated to the daughter and she is agreeable.  If there is further decline despite the maximum treatment that she is getting we will transition to comfort care.   SpO2: (!) 82 % O2 Flow Rate (L/min): 30 L/min FiO2 (%): 100 %  Recent Labs  Lab 04/15/20 0432 04/15/20 0432 04/16/20 0629 04/16/20 0718 04/16/20 0723 04/17/20 1336 04/17/20 1337 04/18/20 1220 04/19/20 0132 04/19/20 0138 04/20/20 0457  WBC 20.3*   < > 21.0*  --   --  24.6*  --  31.2*  --  29.1* 24.4*  PLT 395   < > 447*  --   --  528*  --  653*  --  525* 548*  CRP 6.5*  --  9.5*  --   --  4.6*  --   --   --  5.5* 2.2*  BNP  --   --   --  66.6  --   --  54.0 48.2 100.1*  --  65.4  DDIMER >20.00*  --  14.11*  --   --  9.04*  --  6.29*  --  3.17*  --   PROCALCITON 0.12   < >  --   --  <0.10 <0.10  --  <0.10  --  <0.10 <0.10  AST 31  --  32  --   --  39  --  37  --  34  --   ALT 89*  --  73*  --   --  75*  --   66*  --  58*  --   ALKPHOS 95  --  98  --   --  113  --  133*  --  119  --   BILITOT 0.8  --  0.5  --   --  1.0  --  0.6  --  0.8  --   ALBUMIN 2.4*  --  2.4*  --   --  2.6*  --  2.7*  --  2.4*  --    < > = values in this interval not displayed.      2. Extremely elevated D-dimer. With negative CTA and leg ultrasound. On full dose Lovenox. This will be continued till D-dimer dips below 2 as the risk of developing a clot in the future is extremely high due to intense inflammation.  3. Asymptomatic transaminitis due to COVID-19 viral infection. Stable ultrasound showing fatty liver. Monitor trend which is improving.  4. Abdominal pain. Resolved. Likely due to constipation, stable abdominal ultrasound.  5. Essential hypertension. Currently stable off of medications.  6. GERD. On PPI.  7. Dyslipidemia. On statin.  8. HX of chronic back pain and migraine headaches. On combination of tramadol and Imitrex.    Condition - Extremely Guarded  Family Communication  :  granddaughter Cyril Mourning [phone] on 04/16/20, 04/17/20 (patient left at 11:51 AM on 9/2/202) -  guarded prognosis & now DNR communicated. 04/18/20, 04/19/20-plan again poor prognosis and  transition to comfort measures if patient starts to suffer.  Detailed message left 04/21/2020 at 10:20 AM.  Code Status : DNR  Consults  :  None  Procedures  :    CTA -  1. No evidence of acute pulmonary embolism. 2. Multifocal areas of mixed ground-glass and consolidative opacity with a peripheral predominance with more dense opacification in the dependent portions of the lungs likely reflecting some superimposed atelectatic changes. Findings are favored to represent multifocal pneumonia in the setting of COVID-19 positivity. 3. Trace left pleural effusion. 4. Aortic Atherosclerosis (ICD10-I70.0). 5. Coronary artery calcifications are present. Please note that the presence of coronary artery calcium documents the presence of coronary artery disease,  the severity of this disease and any potential stenosis cannot be assessed on this non-gated CT examination.  Leg [place] -  RIGHT: - There is no evidence of deep vein thrombosis in the lower extremity.  - No cystic structure found in the popliteal fossa.  LEFT: - There is no evidence of deep vein thrombosis in the lower extremity.  - No cystic structure found in the popliteal fossa.  Abd [place] - 1. Negative for gallstones or biliary dilatation 2. Echogenic liver consistent with steatosis and or hepatocellular disease. 3. Simple appearing left renal cyst   PUD Prophylaxis : None  Disposition Plan  :    Status is: Inpatient  Remains inpatient appropriate because:IV treatments appropriate due to intensity of illness or inability to take PO   Dispo: The patient is from: Home              Anticipated d/c is to: Home              Anticipated d/c date is: > 3 days              Patient currently is not medically stable to d/c.   DVT Prophylaxis  :  Lovenox    Lab Results  Component Value Date   PLT 548 (H) 04/20/2020    Diet :  Diet Order            DIET SOFT Room service appropriate? Yes; Fluid consistency: Thin  Diet effective now                  Inpatient Medications  Scheduled Meds: . albuterol  2 puff Inhalation Q6H  . baricitinib  2 mg Oral Daily  . feeding supplement (ENSURE ENLIVE)  237 mL Oral BID BM  . hydroxychloroquine  200 mg Oral BID  . methylPREDNISolone (SOLU-MEDROL) injection  40 mg Intravenous BID  . scopolamine  1 patch Transdermal Q72H   Continuous Infusions: . morphine 2 mg/hr (04/21/20 0859)   PRN Meds:.acetaminophen, acetaminophen, antiseptic oral rinse, bisacodyl, chlorpheniramine-HYDROcodone, hydroxypropyl methylcellulose / hypromellose, LORazepam, morphine, [DISCONTINUED] ondansetron **OR** ondansetron (ZOFRAN) IV  Antibiotics  :    Anti-infectives (From admission, onward)   Start     Dose/Rate Route Frequency Ordered Stop   04/17/20 1630   Ampicillin-Sulbactam (UNASYN) 3 g in sodium chloride 0.9 % 100 mL IVPB  Status:  Discontinued        3 g 200 mL/hr over 30 Minutes Intravenous Every 8 hours 04/17/20 1526 04/20/20 1050   04/13/20 1200  remdesivir 100 mg in sodium chloride 0.9 % 100 mL IVPB        100 mg 200 mL/hr over 30 Minutes Intravenous Daily 04/13/20 1150 04/16/20 1123   04/13/20 1100  remdesivir injection 100 mg  Status:  Discontinued  100 mg Intravenous Every 24 hours 04/13/20 0957 04/13/20 1153   04/12/20 1430  azithromycin (ZITHROMAX) tablet 500 mg        500 mg Oral Daily 04/12/20 1321 04/13/20 1218   04/10/20 1000  remdesivir 100 mg in sodium chloride 0.9 % 100 mL IVPB  Status:  Discontinued       "Followed by" Linked Group Details   100 mg 200 mL/hr over 30 Minutes Intravenous Daily 03/27/2020 1551 04/10/20 0800   03/30/2020 2200  hydroxychloroquine (PLAQUENIL) tablet 200 mg        200 mg Oral 2 times daily 04/07/2020 1653     03/22/2020 1600  remdesivir 200 mg in sodium chloride 0.9% 250 mL IVPB       "Followed by" Linked Group Details   200 mg 580 mL/hr over 30 Minutes Intravenous Once 03/25/2020 1551 04/02/2020 2139   03/31/2020 1600  azithromycin (ZITHROMAX) 500 mg in sodium chloride 0.9 % 250 mL IVPB  Status:  Discontinued        500 mg 250 mL/hr over 60 Minutes Intravenous Every 24 hours 04/02/2020 1552 04/12/20 1321   04/06/2020 1600  cefTRIAXone (ROCEPHIN) 1 g in sodium chloride 0.9 % 100 mL IVPB        1 g 200 mL/hr over 30 Minutes Intravenous Every 24 hours 03/19/2020 1552 04/13/20 1641       Time Spent in minutes  30   Lala Lund M.D on 04/21/2020 at 10:18 AM  To page go to [email or web address] - password [facility]  [facility] -  Office  [phone]   See all Orders from today for further details    Objective:   Vitals:   04/20/20 1050 04/20/20 1234 04/20/20 2101 04/20/20 2217  BP:    140/70  Pulse:   90 96  Resp:   (!) 26 20  Temp:    98.3 F (36.8 C)  TempSrc:      SpO2: 91% 92% 90% (!)  82%  Weight:      Height:        Wt Readings from Last 3 Encounters:  04/10/20 73 kg  10/22/19 73.4 kg  04/16/19 73.9 kg     Intake/Output Summary (Last 24 hours) at 04/21/2020 1018 Last data filed at 04/21/2020 0309 Gross per 24 hour  Intake 9.46 ml  Output 2500 ml  Net -2490.54 ml     Physical Exam  Awake Alert, No new F.N deficits,  [place].AT,PERRAL Supple Neck,No JVD, No cervical lymphadenopathy appriciated.  Symmetrical Chest wall movement, Good air movement bilaterally, +ve rales RRR,No Gallops, Rubs or new Murmurs, No Parasternal Heave +ve B.Sounds, Abd Soft, No tenderness, No organomegaly appriciated, No rebound - guarding or rigidity. No Cyanosis, Clubbing or edema, No new Rash or bruise     Data Review:    CBC Recent Labs  Lab 04/16/20 0629 04/17/20 1336 04/18/20 1220 04/19/20 0138 04/20/20 0457  WBC 21.0* 24.6* 31.2* 29.1* 24.4*  HGB 11.9* 12.6 13.3 12.9 11.5*  HCT 37.8 39.4 41.6 41.2 35.9*  PLT 447* 528* 653* 525* 548*  MCV 92.0 93.6 93.7 92.6 93.7  MCH 29.0 29.9 30.0 29.0 30.0  MCHC 31.5 32.0 32.0 31.3 32.0  RDW 13.1 13.2 13.2 13.1 12.9  LYMPHSABS  --   --   --   --  0.5*  MONOABS  --   --   --   --  0.5  EOSABS  --   --   --   --  0.0  BASOSABS  --   --   --   --  0.1    Recent Labs  Lab 04/15/20 0432 04/15/20 0432 04/16/20 0629 04/16/20 0718 04/16/20 0723 04/17/20 1336 04/17/20 1337 04/18/20 1220 04/19/20 0132 04/19/20 0138 04/20/20 0457  NA 140  --  141  --   --  141  --  140  --  140  --   K 4.1  --  4.4  --   --  4.4  --  4.5  --  5.1  --   CL 101  --  103  --   --  104  --  102  --  103  --   CO2 26  --  24  --   --  22  --  25  --  22  --   GLUCOSE 154*  --  127*  --   --  124*  --  115*  --  161*  --   BUN 25*  --  29*  --   --  27*  --  33*  --  35*  --   CREATININE 0.95  --  0.88  --   --  0.92  --  1.08*  --  1.01*  --   CALCIUM 8.7*  --  8.6*  --   --  8.6*  --  9.0  --  8.8*  --   AST 31  --  32  --   --  39  --  37  --   34  --   ALT 89*  --  73*  --   --  75*  --  66*  --  58*  --   ALKPHOS 95  --  98  --   --  113  --  133*  --  119  --   BILITOT 0.8  --  0.5  --   --  1.0  --  0.6  --  0.8  --   ALBUMIN 2.4*  --  2.4*  --   --  2.6*  --  2.7*  --  2.4*  --   MG  --   --   --   --  2.6* 2.5*  --  2.7*  --  2.7* 2.6*  CRP 6.5*  --  9.5*  --   --  4.6*  --   --   --  5.5* 2.2*  DDIMER >20.00*  --  14.11*  --   --  9.04*  --  6.29*  --  3.17*  --   PROCALCITON 0.12   < >  --   --  <0.10 <0.10  --  <0.10  --  <0.10 <0.10  BNP  --   --   --  66.6  --   --  54.0 48.2 100.1*  --  65.4   < > = values in this interval not displayed.    ------------------------------------------------------------------------------------------------------------------ No results for input(s): CHOL, HDL, LDLCALC, TRIG, CHOLHDL, LDLDIRECT in the last 72 hours.  Lab Results  Component Value Date   HGBA1C 5.6 10/22/2019   ------------------------------------------------------------------------------------------------------------------ No results for input(s): TSH, T4TOTAL, T3FREE, THYROIDAB in the last 72 hours.  Invalid input(s): FREET3  Cardiac Enzymes No results for input(s): CKMB, TROPONINI, MYOGLOBIN in the last 168 hours.  Invalid input(s): CK ------------------------------------------------------------------------------------------------------------------    Component Value Date/Time   BNP 65.4 04/20/2020 0457    Micro Results Recent Results (from the past 240 hour(s))  MRSA PCR Screening     Status: None   Collection Time:  04/17/20  3:14 PM   Specimen: Nasal Mucosa; Nasopharyngeal  Result Value Ref Range Status   MRSA by PCR NEGATIVE NEGATIVE Final    Comment:        The GeneXpert MRSA Assay (FDA approved for NASAL specimens only), is one component of a comprehensive MRSA colonization surveillance program. It is not intended to diagnose MRSA infection nor to guide or monitor treatment for MRSA  infections. Performed at [facility], [place] [address] [place], [place] [address]     Radiology Reports CT ANGIO CHEST PE W OR WO CONTRAST  Result Date: 04/15/2020 CLINICAL DATA:  PE suspected, COVID-19 positive EXAM: CT ANGIOGRAPHY CHEST WITH CONTRAST TECHNIQUE: Multidetector CT imaging of the chest was performed using the standard protocol during bolus administration of intravenous contrast. Multiplanar CT image reconstructions and MIPs were obtained to evaluate the vascular anatomy. CONTRAST:  89mL OMNIPAQUE IOHEXOL 350 MG/ML SOLN COMPARISON:  Radiograph 03/24/2020 FINDINGS: Cardiovascular: Satisfactory opacification the pulmonary arteries to the segmental level. No pulmonary artery filling defects are identified. Central pulmonary arteries are normal caliber. Normal heart size. No pericardial effusion. Scattered coronary artery calcifications are present. Atherosclerotic plaque within the normal caliber aorta. No acute luminal abnormality nor periaortic stranding hemorrhage. Shared origin of the brachiocephalic and left common carotid artery. Minimal plaque in the otherwise normal proximal great vessels. Mediastinum/Nodes: Few scattered low-attenuation subcentimeter mediastinal and hilar nodes are present. No enlarged mediastinal, hilar or axillary adenopathy. No acute abnormality of the trachea or esophagus. Thyroid gland demonstrates few subcentimeter hypoattenuating foci, without significant enlargement of the gland. No further imaging follow-up is warranted in a patient of this age (Reference: J Am Coll Radiol. 2015 Feb;12(2): 143-50). Lungs/Pleura: There are multifocal areas of mixed ground-glass and consolidative opacity with a peripheral predominance with more dense opacification in the dependent portions of the lungs likely reflecting some superimposed atelectatic changes. There is a trace left effusion as well. No pneumothorax or right effusion. Small air cyst seen in the right lung base  (5/67). Upper Abdomen: No acute abnormalities present in the visualized portions of the upper abdomen. Small accessory splenule Musculoskeletal: Multilevel discogenic and facet degenerative changes, maximal C5-6 where sclerotic, likely Modic type endplate changes are present with some mild retrolisthesis. Exaggerated thoracic kyphosis is noted with a slight pectus deformity of the chest (Haller index 2.55). No acute or suspicious osseous lesions. Review of the MIP images confirms the above findings. IMPRESSION: 1. No evidence of acute pulmonary embolism. 2. Multifocal areas of mixed ground-glass and consolidative opacity with a peripheral predominance with more dense opacification in the dependent portions of the lungs likely reflecting some superimposed atelectatic changes. Findings are favored to represent multifocal pneumonia in the setting of COVID-19 positivity. 3. Trace left pleural effusion. 4. Aortic Atherosclerosis (ICD10-I70.0). 5. Coronary artery calcifications are present. Please note that the presence of coronary artery calcium documents the presence of coronary artery disease, the severity of this disease and any potential stenosis cannot be assessed on this non-gated CT examination. Electronically Signed   By: Lovena Le M.D.   On: 04/15/2020 16:00   US Abdomen Complete  Result Date: 04/10/2020 CLINICAL DATA:  Epigastric pain EXAM: ABDOMEN ULTRASOUND COMPLETE COMPARISON:  None. FINDINGS: Gallbladder: No gallstones or wall thickening visualized. No sonographic Murphy sign noted by sonographer. Common bile duct: Diameter: 3.3 mm Liver: Liver is echogenic. No focal hepatic abnormality. Portal vein is patent on color Doppler imaging with normal direction of blood flow towards the liver. IVC: No abnormality visualized.  Pancreas: Visualized portion unremarkable. Spleen: Size and appearance within normal limits. Right Kidney: Length: 10 cm. Echogenicity within normal limits. No mass or hydronephrosis  visualized. Left Kidney: Length: 10.4 cm. Echogenicity within normal limits. No hydronephrosis. Cyst off the lower pole measuring 3.1 cm. Abdominal aorta: No aneurysm visualized. Other findings: None. IMPRESSION: 1. Negative for gallstones or biliary dilatation 2. Echogenic liver consistent with steatosis and or hepatocellular disease. 3. Simple appearing left renal cyst Electronically Signed   By: Donavan Foil M.D.   On: 04/10/2020 18:12   DG Chest Port 1 View  Result Date: 04/19/2020 CLINICAL DATA:  Shortness of breath, COVID positive. EXAM: PORTABLE CHEST 1 VIEW COMPARISON:  Chest x-ray dated 04/17/2020. FINDINGS: Heart size and mediastinal contours are within normal limits. Patchy ground-glass opacities are stable within each lung, again most dense at the RIGHT lung base. No pleural effusion or pneumothorax is seen. IMPRESSION: Stable appearance of the bilateral multifocal pneumonia. Electronically Signed   By: Franki Cabot M.D.   On: 04/19/2020 08:50   DG Chest Port 1 View  Result Date: 04/17/2020 CLINICAL DATA:  Shortness of breath, COVID positive EXAM: PORTABLE CHEST 1 VIEW COMPARISON:  04/16/2020 FINDINGS: No significant change in AP portable chest radiograph with heterogeneous airspace opacities present in the peripheral mid lungs and lung bases. No new airspace opacity. The heart and mediastinum are unremarkable. IMPRESSION: No significant change in AP portable chest radiograph with heterogeneous airspace opacities present in the peripheral mid lungs and lung bases, in keeping with COVID airspace disease. No new airspace opacity. Electronically Signed   By: Eddie Candle M.D.   On: 04/17/2020 10:23   DG Chest Port 1 View  Result Date: 04/16/2020 CLINICAL DATA:  Shortness of breath, COVID-19 positive. EXAM: PORTABLE CHEST 1 VIEW COMPARISON:  April 09, 2020. FINDINGS: The heart size and mediastinal contours are within normal limits. No pneumothorax or pleural effusion is noted. Stable mild diffuse  interstitial reticular densities are noted in the lungs bilaterally concerning for atypical inflammation. The visualized skeletal structures are unremarkable. IMPRESSION: Stable mild diffuse interstitial reticular densities are noted in the lungs bilaterally concerning for atypical inflammation. Electronically Signed   By: Marijo Conception M.D.   On: 04/16/2020 08:13   DG Chest Portable 1 View  Result Date: 04/03/2020 CLINICAL DATA:  Shortness of breath.  COVID-19 exposure EXAM: PORTABLE CHEST 1 VIEW COMPARISON:  01/13/2012 FINDINGS: The heart size and mediastinal contours are within normal limits. Atherosclerotic calcification of the aortic knob. Diffuse increased interstitial markings throughout both lungs. No pleural effusion or pneumothorax. The visualized skeletal structures are unremarkable. IMPRESSION: Diffuse increased interstitial markings throughout both lungs, which may reflect edema versus atypical/viral infection. Electronically Signed   By: Davina Poke D.O.   On: 04/02/2020 13:36   DG Abd Portable 1V  Result Date: 04/10/2020 CLINICAL DATA:  Abdominal pain. EXAM: PORTABLE ABDOMEN - 1 VIEW COMPARISON:  None. FINDINGS: The bowel gas pattern is normal. No radio-opaque calculi or other significant radiographic abnormality are seen. IMPRESSION: Negative. Electronically Signed   By: Marijo Conception M.D.   On: 04/10/2020 15:17   VAS [place] LOWER EXTREMITY VENOUS (DVT)  Result Date: 04/15/2020  Lower Venous DVTStudy Indications: Elevated d dimer >20, aware of recent negative doppler.  Comparison Study: 04/11/20 previous Performing Technologist: Abram Sander RVS  Examination Guidelines: A complete evaluation includes B-mode imaging, spectral Doppler, color Doppler, and power Doppler as needed of all accessible portions of each vessel. Bilateral testing is considered an integral  part of a complete examination. Limited examinations for reoccurring indications may be performed as noted. The reflux  portion of the exam is performed with the patient in reverse Trendelenburg.  +---------+---------------+---------+-----------+----------+--------------+ RIGHT    CompressibilityPhasicitySpontaneityPropertiesThrombus Aging +---------+---------------+---------+-----------+----------+--------------+ CFV      Full           Yes      Yes                                 +---------+---------------+---------+-----------+----------+--------------+ SFJ      Full                                                        +---------+---------------+---------+-----------+----------+--------------+ FV Prox  Full                                                        +---------+---------------+---------+-----------+----------+--------------+ FV Mid   Full                                                        +---------+---------------+---------+-----------+----------+--------------+ FV DistalFull                                                        +---------+---------------+---------+-----------+----------+--------------+ PFV      Full                                                        +---------+---------------+---------+-----------+----------+--------------+ POP      Full           Yes      Yes                                 +---------+---------------+---------+-----------+----------+--------------+ PTV      Full                                                        +---------+---------------+---------+-----------+----------+--------------+ PERO     Full                                                        +---------+---------------+---------+-----------+----------+--------------+   +---------+---------------+---------+-----------+----------+--------------+ LEFT     CompressibilityPhasicitySpontaneityPropertiesThrombus Aging +---------+---------------+---------+-----------+----------+--------------+ CFV  Full           Yes      Yes                                  +---------+---------------+---------+-----------+----------+--------------+ SFJ      Full                                                        +---------+---------------+---------+-----------+----------+--------------+ FV Prox  Full                                                        +---------+---------------+---------+-----------+----------+--------------+ FV Mid   Full                                                        +---------+---------------+---------+-----------+----------+--------------+ FV DistalFull                                                        +---------+---------------+---------+-----------+----------+--------------+ PFV      Full                                                        +---------+---------------+---------+-----------+----------+--------------+ POP      Full           Yes      Yes                                 +---------+---------------+---------+-----------+----------+--------------+ PTV      Full                                                        +---------+---------------+---------+-----------+----------+--------------+ PERO     Full                                                        +---------+---------------+---------+-----------+----------+--------------+     Summary: BILATERAL: - No evidence of deep vein thrombosis seen in the lower extremities, bilaterally. - No evidence of superficial venous thrombosis in the lower extremities, bilaterally. -   *See table(s) above for measurements and observations. Electronically signed by Harold Barban MD on 04/15/2020 at 8:50:57 PM.    Final    VAS [place]  LOWER EXTREMITY VENOUS (DVT)  Result Date: 04/11/2020  Lower Venous DVTStudy Indications: Elevated Ddimer.  Risk Factors: COVID 19 positive. Comparison Study: No prior studies. Performing Technologist: Oliver Hum RVT  Examination Guidelines: A complete evaluation includes B-mode  imaging, spectral Doppler, color Doppler, and power Doppler as needed of all accessible portions of each vessel. Bilateral testing is considered an integral part of a complete examination. Limited examinations for reoccurring indications may be performed as noted. The reflux portion of the exam is performed with the patient in reverse Trendelenburg.  +---------+---------------+---------+-----------+----------+--------------+ RIGHT    CompressibilityPhasicitySpontaneityPropertiesThrombus Aging +---------+---------------+---------+-----------+----------+--------------+ CFV      Full           Yes      Yes                                 +---------+---------------+---------+-----------+----------+--------------+ SFJ      Full                                                        +---------+---------------+---------+-----------+----------+--------------+ FV Prox  Full                                                        +---------+---------------+---------+-----------+----------+--------------+ FV Mid   Full                                                        +---------+---------------+---------+-----------+----------+--------------+ FV DistalFull                                                        +---------+---------------+---------+-----------+----------+--------------+ PFV      Full                                                        +---------+---------------+---------+-----------+----------+--------------+ POP      Full           Yes      Yes                                 +---------+---------------+---------+-----------+----------+--------------+ PTV      Full                                                        +---------+---------------+---------+-----------+----------+--------------+ PERO     Full                                                        +---------+---------------+---------+-----------+----------+--------------+    +---------+---------------+---------+-----------+----------+--------------+  LEFT     CompressibilityPhasicitySpontaneityPropertiesThrombus Aging +---------+---------------+---------+-----------+----------+--------------+ CFV      Full           Yes      Yes                                 +---------+---------------+---------+-----------+----------+--------------+ SFJ      Full                                                        +---------+---------------+---------+-----------+----------+--------------+ FV Prox  Full                                                        +---------+---------------+---------+-----------+----------+--------------+ FV Mid   Full                                                        +---------+---------------+---------+-----------+----------+--------------+ FV DistalFull                                                        +---------+---------------+---------+-----------+----------+--------------+ PFV      Full                                                        +---------+---------------+---------+-----------+----------+--------------+ POP      Full           Yes      Yes                                 +---------+---------------+---------+-----------+----------+--------------+ PTV      Full                                                        +---------+---------------+---------+-----------+----------+--------------+ PERO     Full                                                        +---------+---------------+---------+-----------+----------+--------------+     Summary: RIGHT: - There is no evidence of deep vein thrombosis in the lower extremity.  - No cystic structure found in the popliteal fossa.  LEFT: - There is no evidence of deep vein thrombosis in the lower extremity.  - No cystic  structure found in the popliteal fossa.  *See table(s) above for measurements and observations. Electronically signed  by Servando Snare MD on 04/11/2020 at 4:56:04 PM.    Final

## 2020-04-21 NOTE — Progress Notes (Signed)
Patient transferred to room 25, alert and oriented. No pain. Respiration regular. No distress. On oxygen high flow at 15 LPM. Patient appear comfortable.

## 2020-04-21 NOTE — Plan of Care (Signed)
  Problem: Education: Goal: Knowledge of General Education information will improve Description: Including pain rating scale, medication(s)/side effects and non-pharmacologic comfort measures Outcome: Progressing   Problem: Health Behavior/Discharge Planning: Goal: Ability to manage health-related needs will improve Outcome: Progressing   Problem: Clinical Measurements: Goal: Will remain free from infection Outcome: Progressing Goal: Diagnostic test results will improve Outcome: Progressing   Problem: Education: Goal: Knowledge of risk factors and measures for prevention of condition will improve Outcome: Progressing   Problem: Coping: Goal: Psychosocial and spiritual needs will be supported Outcome: Progressing   Problem: Respiratory: Goal: Will maintain a patent airway Outcome: Progressing Goal: Complications related to the disease process, condition or treatment will be avoided or minimized Outcome: Progressing

## 2020-04-21 NOTE — Progress Notes (Signed)
Palliative:  HPI: 79 y.o.femalewith medical history significant ofhypertension, migraine headache, GERD, hyperlipidemia presents to emergency department with worsening shortness of breath.Patient is fully vaccinated, she presents with COVID-19 symptoms, she was noted to be hypoxic, admitted for further work-up,unfortunately she kept having increased oxygen requirement. 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 Palliative care was asked to get involved to aid in [place] conversations in the setting of a ten day hospitalization with increasing O2 needs and no improvements.  I met today at [name]'s bedside. She is sleeping comfortably on morphine infusion. She does arouse and whispers about wanting coffee but then immediately falling back asleep. Breathing is regular but shallow with no apnea noted. Currently on 12L oxygen. Extremities are warm to touch. Continue morphine as needed for comfort and weaning oxygen as able if she appears comfortable and without shortness of breath. I attempted to call granddaughter, [name], to follow up on conversation yesterday and offer emotional support but no answer. I left voicemail.   Exam: Arousable to touch (not to voice). Pallor. Breathing shallow but regular, no apnea. Abd flat. Extremities warm to touch. No mottling.   Plan: - Continue comfort measures. - No changes to plan.  - Anticipate hospital death.   15 min  Vinie Sill, NP Palliative Medicine Team Pager [phone] (Please see [email or web address] for schedule) Team Phone [phone]    Greater than 50%  of this time was spent counseling and coordinating care related to the above assessment and plan

## 2020-04-22 DIAGNOSIS — Z515 Encounter for palliative care: Secondary | ICD-10-CM

## 2020-04-22 MED ORDER — LORAZEPAM 2 MG/ML IJ SOLN
2.0000 mg | INTRAMUSCULAR | Status: AC | PRN
  Administered 2020-04-25: 2 mg via INTRAVENOUS
  Filled 2020-04-22: qty 1

## 2020-04-22 MED ORDER — HALOPERIDOL LACTATE 5 MG/ML IJ SOLN: 5.0000 mg | Freq: Four times a day (QID) | INTRAMUSCULAR | Status: DC | PRN

## 2020-04-22 MED ORDER — GLYCOPYRROLATE 0.2 MG/ML IJ SOLN
0.2000 mg | Freq: Three times a day (TID) | INTRAMUSCULAR | Status: DC
  Administered 2020-04-22 – 2020-04-23 (×4): 0.2 mg via INTRAVENOUS
  Filled 2020-04-22 (×6): qty 1

## 2020-04-22 MED ORDER — ALBUTEROL SULFATE HFA 108 (90 BASE) MCG/ACT IN AERS
2.0000 | INHALATION_SPRAY | Freq: Three times a day (TID) | RESPIRATORY_TRACT | Status: DC
  Administered 2020-04-22 (×3): 2 via RESPIRATORY_TRACT
  Filled 2020-04-22: qty 6.7

## 2020-04-22 MED ORDER — MORPHINE BOLUS VIA INFUSION
4.0000 mg | INTRAVENOUS | Status: DC | PRN
  Filled 2020-04-22: qty 4

## 2020-04-22 MED ORDER — LORAZEPAM 2 MG/ML IJ SOLN
1.0000 mg | INTRAMUSCULAR | Status: AC | PRN
  Administered 2020-04-22: 1 mg via INTRAVENOUS
  Filled 2020-04-22: qty 1

## 2020-04-22 MED ORDER — HYDROXYCHLOROQUINE SULFATE 200 MG PO TABS
200.0000 mg | ORAL_TABLET | Freq: Two times a day (BID) | ORAL | Status: DC
  Administered 2020-04-22: 200 mg via ORAL
  Filled 2020-04-22 (×2): qty 1

## 2020-04-22 NOTE — Progress Notes (Signed)
Palliative:  HPI: 79 y.o.femalewith medical history significant ofhypertension, migraine headache, GERD, hyperlipidemia presents to emergency department with worsening shortness of breath.Patient is fully vaccinated, she presents with COVID-19 symptoms, she was noted to be hypoxic, admitted for further work-up,unfortunately she kept having increased oxygen requirement. She is followed by dermatology-has history of granuloma annulare-and takes hydroxychloroquine. Palliative care was asked to get involved to aid in [facility] conversations in the setting of a ten day hospitalization with increasing O2 needs and no improvements.  I met today at Ms. Abeer's bedside along with bedside RN. Shawna is having some respiratory distress with dry cough. She appears uncomfortable. I worked with RN to increase basal rate of morphine and provide bolus dose. Also giving ativan to better provide comfort. Discussed with RN goal for comfort and focus on how Harvey looks and feels instead of on her oxygen level or numbers. Also recommend liberalized bolus for relief and if needed frequent boluses may titrate basal rate up. No plans to escalate care or oxygen. May escalate morphine and medication as needed to achieve comfort. She did appear much more comfortable by the end of my visit.   I attempted to call son, Quillian Quince, but no answer and I did leave voicemail. I did speak with Dr. Candiss Norse who spoke with Quillian Quince today and plan is to continue on with comfort care per [facility] previous stated wishes.   Exam: More alert but distressed. Breathing tachypneic, accessory muscle use. Cyanotic. Abd flat. Unable to assess orientation due to respiratory distress.   Plan: - Increasing morphine to better provide comfort.  - Patient made decision for comfort care for herself 9/5.   25 min  Vinie Sill, NP Palliative Medicine Team Pager [phone] (Please see [email or web address] for schedule) Team Phone [phone]    Greater than 50%  of this  time was spent counseling and coordinating care related to the above assessment and plan

## 2020-04-22 NOTE — Progress Notes (Signed)
Aided pt in making a phone call to her son- Quillian Quince

## 2020-04-22 NOTE — Progress Notes (Addendum)
PROGRESS NOTE                                                                                                                                                                                                             Patient Demographics:    [name], is a 79 y.o. female, DOB - 1940/09/01, EYC:[id number]  Outpatient Primary MD for the patient is [name], MD    LOS - 13  Admit date - 03/26/2020    Chief Complaint  Patient presents with  . Shortness of Breath       Brief Narrative - Cassandra A Suttonis a 79 y.o.femalewith medical history significant ofhypertension, migraine headache, GERD, hyperlipidemia presents to emergency department with worsening shortness of breath.  Patient is fully vaccinated, she presents with COVID-19 symptoms, she was noted to be hypoxic, admitted for further work-up , unfortunately she kept having increased oxygen requirement. 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 No history of smoking, alcohol, licit drug use.    Subjective:   Patient in bed, mild shortness of breath but overall feels okay.   Assessment  & Plan :     Note despite full medical care for aggressive COVID-19 pneumonia and acute hypoxic respiratory failure patient continued to decline, she has been transitioned to full comfort measures on 05/18/2020.  Expect hospital death.  Continue comfort measures along with as much oxygen as needed to keep her comfortable.    Other medical issues addressed this hospital admission are below.    1. Acute Hypoxic Resp. Failure due to Acute Covid 19 Viral Pneumonitis during the ongoing 2020 Covid 19 Pandemic, she is fully vaccinated and seems to have breakthrough infection due to waning immunity - she has incurred severe parenchymal lung injury, she is severely hypoxic and being treated with combination of IV steroids,  remdesivir and Baricitinib. Quite tenuous on 60 L of heated high flow along + nonrebreather. CTA ruled out PE.  We are diuresing as needed basis with IV Lasix along with Unasyn for possible subclinical aspiration.   Note despite full scope of aggressive treatment and exhausting all measures she continues to be extremely hypoxic requiring 60 L of heated high flow oxygen along with nonrebreather mask and is gradually declining, I have explained to patient and granddaughter, at this point I think  she is starting to suffer, patient realizes that she is dying, she remains DNR.  Will involve palliative care for goals of care.  Possibly transitioning to comfort care would not be a bad idea at this point as I think patient now is suffering without any clinical recovery.   Detailed discussions with patient and patient's granddaughter, patient wishes not to be on a ventilator, if she further declines she wants to be made comfortable.  This was also communicated to the daughter and she is agreeable.  If there is further decline despite the maximum treatment that she is getting we will transition to comfort care.   SpO2: 95 % O2 Flow Rate (L/min): 15 L/min FiO2 (%): 100 %  Recent Labs  Lab 04/16/20 0629 04/16/20 0718 04/16/20 0723 04/17/20 1336 04/17/20 1337 04/18/20 1220 04/19/20 0132 04/19/20 0138 04/20/20 0457  WBC 21.0*  --   --  24.6*  --  31.2*  --  29.1* 24.4*  PLT 447*  --   --  528*  --  653*  --  525* 548*  CRP 9.5*  --   --  4.6*  --   --   --  5.5* 2.2*  BNP  --  66.6  --   --  54.0 48.2 100.1*  --  65.4  DDIMER 14.11*  --   --  9.04*  --  6.29*  --  3.17*  --   PROCALCITON  --   --  <0.10 <0.10  --  <0.10  --  <0.10 <0.10  AST 32  --   --  39  --  37  --  34  --   ALT 73*  --   --  75*  --  66*  --  58*  --   ALKPHOS 98  --   --  113  --  133*  --  119  --   BILITOT 0.5  --   --  1.0  --  0.6  --  0.8  --   ALBUMIN 2.4*  --   --  2.6*  --  2.7*  --  2.4*  --       2. Extremely  elevated D-dimer. With negative CTA and leg ultrasound. On full dose Lovenox. This will be continued till D-dimer dips below 2 as the risk of developing a clot in the future is extremely high due to intense inflammation.  3. Asymptomatic transaminitis due to COVID-19 viral infection. Stable ultrasound showing fatty liver. Monitor trend which is improving.  4. Abdominal pain. Resolved. Likely due to constipation, stable abdominal ultrasound.  5. Essential hypertension. Currently stable off of medications.  6. GERD. On PPI.  7. Dyslipidemia. On statin.  8. HX of chronic back pain and migraine headaches. On combination of tramadol and Imitrex.    Condition - Extremely Guarded  Family Communication  :    Son [name] [phone] - 04/22/20  [name] - [phone] - 04/22/20 - no answer no voicemail set at 11:30 AM.  [name] [phone] on 04/16/20, 04/17/20 (patient left at 11:51 AM on 9/2/202) -  guarded prognosis & now DNR communicated. 04/18/20, 04/19/20-plan again poor prognosis and transition to comfort measures if patient starts to suffer.  Detailed message left 04/21/2020 at 10:20 AM, she returned call in half an hour and was updated.  On 04/22/2020 at 9:50 AM phone call again went to voicemail, 2nd call 11.17am.  Husband [name] - [phone] - number not working ? Call cannot  be completed.      Code Status : DNR  Consults  :  None  Procedures  :    CTA -  1. No evidence of acute pulmonary embolism. 2. Multifocal areas of mixed ground-glass and consolidative opacity with a peripheral predominance with more dense opacification in the dependent portions of the lungs likely reflecting some superimposed atelectatic changes. Findings are favored to represent multifocal pneumonia in the setting of COVID-19 positivity. 3. Trace left pleural effusion. 4. Aortic Atherosclerosis (ICD10-I70.0). 5. Coronary artery calcifications are present. Please note that the presence of coronary  artery calcium documents the presence of coronary artery disease, the severity of this disease and any potential stenosis cannot be assessed on this non-gated CT examination.  Leg [place] -  RIGHT: - There is no evidence of deep vein thrombosis in the lower extremity.  - No cystic structure found in the popliteal fossa.  LEFT: - There is no evidence of deep vein thrombosis in the lower extremity.  - No cystic structure found in the popliteal fossa.  Abd [place] - 1. Negative for gallstones or biliary dilatation 2. Echogenic liver consistent with steatosis and or hepatocellular disease. 3. Simple appearing left renal cyst   PUD Prophylaxis : None  Disposition Plan  :    Status is: Inpatient  Remains inpatient appropriate because:IV treatments appropriate due to intensity of illness or inability to take PO   Dispo: The patient is from: Home              Anticipated d/c is to: Home              Anticipated d/c date is: > 3 days              Patient currently is not medically stable to d/c.   DVT Prophylaxis  :  Lovenox    Lab Results  Component Value Date   PLT 548 (H) 04/20/2020    Diet :  Diet Order            DIET SOFT Room service appropriate? Yes; Fluid consistency: Thin  Diet effective now                  Inpatient Medications  Scheduled Meds: . albuterol  2 puff Inhalation TID  . baricitinib  2 mg Oral Daily  . feeding supplement (ENSURE ENLIVE)  237 mL Oral BID BM  . hydroxychloroquine  200 mg Oral BID  . methylPREDNISolone (SOLU-MEDROL) injection  40 mg Intravenous BID  . scopolamine  1 patch Transdermal Q72H   Continuous Infusions: . morphine 2 mg/hr (04/21/20 1721)   PRN Meds:.acetaminophen, acetaminophen, antiseptic oral rinse, bisacodyl, chlorpheniramine-HYDROcodone, hydroxypropyl methylcellulose / hypromellose, LORazepam, morphine, [DISCONTINUED] ondansetron **OR** ondansetron (ZOFRAN) IV  Antibiotics  :    Anti-infectives (From admission, onward)   Start      Dose/Rate Route Frequency Ordered Stop   04/17/20 1630  Ampicillin-Sulbactam (UNASYN) 3 g in sodium chloride 0.9 % 100 mL IVPB  Status:  Discontinued        3 g 200 mL/hr over 30 Minutes Intravenous Every 8 hours 04/17/20 1526 04/20/20 1050   04/13/20 1200  remdesivir 100 mg in sodium chloride 0.9 % 100 mL IVPB        100 mg 200 mL/hr over 30 Minutes Intravenous Daily 04/13/20 1150 04/16/20 1123   04/13/20 1100  remdesivir injection 100 mg  Status:  Discontinued        100 mg Intravenous Every 24 hours 04/13/20  0957 04/13/20 1153   04/12/20 1430  azithromycin (ZITHROMAX) tablet 500 mg        500 mg Oral Daily 04/12/20 1321 04/13/20 1218   04/10/20 1000  remdesivir 100 mg in sodium chloride 0.9 % 100 mL IVPB  Status:  Discontinued       "Followed by" Linked Group Details   100 mg 200 mL/hr over 30 Minutes Intravenous Daily 04/03/2020 1551 04/10/20 0800   03/24/2020 2200  hydroxychloroquine (PLAQUENIL) tablet 200 mg        200 mg Oral 2 times daily 04/03/2020 1653     03/16/2020 1600  remdesivir 200 mg in sodium chloride 0.9% 250 mL IVPB       "Followed by" Linked Group Details   200 mg 580 mL/hr over 30 Minutes Intravenous Once 04/15/2020 1551 03/28/2020 2139   04/07/2020 1600  azithromycin (ZITHROMAX) 500 mg in sodium chloride 0.9 % 250 mL IVPB  Status:  Discontinued        500 mg 250 mL/hr over 60 Minutes Intravenous Every 24 hours 03/29/2020 1552 04/12/20 1321   03/20/2020 1600  cefTRIAXone (ROCEPHIN) 1 g in sodium chloride 0.9 % 100 mL IVPB        1 g 200 mL/hr over 30 Minutes Intravenous Every 24 hours 04/07/2020 1552 04/13/20 1641       Time Spent in minutes  30   Lala Lund M.D on 04/22/2020 at 9:49 AM  To page go to [email or web address] - password [facility]  [facility] -  Office  [phone]   See all Orders from today for further details    Objective:   Vitals:   04/21/20 0800 04/21/20 0900 04/21/20 0930 04/21/20 1848  BP:    130/72  Pulse:    97  Resp: (!) 24 (!) 40 (!) 30 (!)  23  Temp:    98.3 F (36.8 C)  TempSrc:    Oral  SpO2: 95%     Weight:      Height:        Wt Readings from Last 3 Encounters:  04/10/20 73 kg  10/22/19 73.4 kg  04/16/19 73.9 kg     Intake/Output Summary (Last 24 hours) at 04/22/2020 0949 Last data filed at 04/22/2020 0508 Gross per 24 hour  Intake 350 ml  Output 400 ml  Net -50 ml     Physical Exam  Awake Alert, No new F.N deficits,  [place].AT,PERRAL Supple Neck,No JVD, No cervical lymphadenopathy appriciated.  Symmetrical Chest wall movement, Good air movement bilaterally, +ve rales RRR,No Gallops, Rubs or new Murmurs, No Parasternal Heave +ve B.Sounds, Abd Soft, No tenderness, No organomegaly appriciated, No rebound - guarding or rigidity. No Cyanosis, Clubbing or edema, No new Rash or bruise     Data Review:    CBC Recent Labs  Lab 04/16/20 0629 04/17/20 1336 04/18/20 1220 04/19/20 0138 04/20/20 0457  WBC 21.0* 24.6* 31.2* 29.1* 24.4*  HGB 11.9* 12.6 13.3 12.9 11.5*  HCT 37.8 39.4 41.6 41.2 35.9*  PLT 447* 528* 653* 525* 548*  MCV 92.0 93.6 93.7 92.6 93.7  MCH 29.0 29.9 30.0 29.0 30.0  MCHC 31.5 32.0 32.0 31.3 32.0  RDW 13.1 13.2 13.2 13.1 12.9  LYMPHSABS  --   --   --   --  0.5*  MONOABS  --   --   --   --  0.5  EOSABS  --   --   --   --  0.0  BASOSABS  --   --   --   --  0.1    Recent Labs  Lab 04/16/20 0629 04/16/20 0718 04/16/20 0723 04/17/20 1336 04/17/20 1337 04/18/20 1220 04/19/20 0132 04/19/20 0138 04/20/20 0457  NA 141  --   --  141  --  140  --  140  --   K 4.4  --   --  4.4  --  4.5  --  5.1  --   CL 103  --   --  104  --  102  --  103  --   CO2 24  --   --  22  --  25  --  22  --   GLUCOSE 127*  --   --  124*  --  115*  --  161*  --   BUN 29*  --   --  27*  --  33*  --  35*  --   CREATININE 0.88  --   --  0.92  --  1.08*  --  1.01*  --   CALCIUM 8.6*  --   --  8.6*  --  9.0  --  8.8*  --   AST 32  --   --  39  --  37  --  34  --   ALT 73*  --   --  75*  --  66*  --  58*  --     ALKPHOS 98  --   --  113  --  133*  --  119  --   BILITOT 0.5  --   --  1.0  --  0.6  --  0.8  --   ALBUMIN 2.4*  --   --  2.6*  --  2.7*  --  2.4*  --   MG  --   --  2.6* 2.5*  --  2.7*  --  2.7* 2.6*  CRP 9.5*  --   --  4.6*  --   --   --  5.5* 2.2*  DDIMER 14.11*  --   --  9.04*  --  6.29*  --  3.17*  --   PROCALCITON  --   --  <0.10 <0.10  --  <0.10  --  <0.10 <0.10  BNP  --  66.6  --   --  54.0 48.2 100.1*  --  65.4    ------------------------------------------------------------------------------------------------------------------ No results for input(s): CHOL, HDL, LDLCALC, TRIG, CHOLHDL, LDLDIRECT in the last 72 hours.  Lab Results  Component Value Date   HGBA1C 5.6 10/22/2019   ------------------------------------------------------------------------------------------------------------------ No results for input(s): TSH, T4TOTAL, T3FREE, THYROIDAB in the last 72 hours.  Invalid input(s): FREET3  Cardiac Enzymes No results for input(s): CKMB, TROPONINI, MYOGLOBIN in the last 168 hours.  Invalid input(s): CK ------------------------------------------------------------------------------------------------------------------    Component Value Date/Time   BNP 65.4 04/20/2020 0457    Micro Results Recent Results (from the past 240 hour(s))  MRSA PCR Screening     Status: None   Collection Time: 04/17/20  3:14 PM   Specimen: Nasal Mucosa; Nasopharyngeal  Result Value Ref Range Status   MRSA by PCR NEGATIVE NEGATIVE Final    Comment:        The GeneXpert MRSA Assay (FDA approved for NASAL specimens only), is one component of a comprehensive MRSA colonization surveillance program. It is not intended to diagnose MRSA infection nor to guide or monitor treatment for MRSA infections. Performed at [facility], [place] [address], [place],  [address]     Radiology Reports  CT ANGIO CHEST PE W OR WO CONTRAST  Result Date: 04/15/2020 CLINICAL DATA:  PE  suspected, COVID-19 positive EXAM: CT ANGIOGRAPHY CHEST WITH CONTRAST TECHNIQUE: Multidetector CT imaging of the chest was performed using the standard protocol during bolus administration of intravenous contrast. Multiplanar CT image reconstructions and MIPs were obtained to evaluate the vascular anatomy. CONTRAST:  35mL OMNIPAQUE IOHEXOL 350 MG/ML SOLN COMPARISON:  Radiograph 03/17/2020 FINDINGS: Cardiovascular: Satisfactory opacification the pulmonary arteries to the segmental level. No pulmonary artery filling defects are identified. Central pulmonary arteries are normal caliber. Normal heart size. No pericardial effusion. Scattered coronary artery calcifications are present. Atherosclerotic plaque within the normal caliber aorta. No acute luminal abnormality nor periaortic stranding hemorrhage. Shared origin of the brachiocephalic and left common carotid artery. Minimal plaque in the otherwise normal proximal great vessels. Mediastinum/Nodes: Few scattered low-attenuation subcentimeter mediastinal and hilar nodes are present. No enlarged mediastinal, hilar or axillary adenopathy. No acute abnormality of the trachea or esophagus. Thyroid gland demonstrates few subcentimeter hypoattenuating foci, without significant enlargement of the gland. No further imaging follow-up is warranted in a patient of this age (Reference: J Am Coll Radiol. 2015 Feb;12(2): 143-50). Lungs/Pleura: There are multifocal areas of mixed ground-glass and consolidative opacity with a peripheral predominance with more dense opacification in the dependent portions of the lungs likely reflecting some superimposed atelectatic changes. There is a trace left effusion as well. No pneumothorax or right effusion. Small air cyst seen in the right lung base (5/67). Upper Abdomen: No acute abnormalities present in the visualized portions of the upper abdomen. Small accessory splenule Musculoskeletal: Multilevel discogenic and facet degenerative changes,  maximal C5-6 where sclerotic, likely Modic type endplate changes are present with some mild retrolisthesis. Exaggerated thoracic kyphosis is noted with a slight pectus deformity of the chest (Haller index 2.55). No acute or suspicious osseous lesions. Review of the MIP images confirms the above findings. IMPRESSION: 1. No evidence of acute pulmonary embolism. 2. Multifocal areas of mixed ground-glass and consolidative opacity with a peripheral predominance with more dense opacification in the dependent portions of the lungs likely reflecting some superimposed atelectatic changes. Findings are favored to represent multifocal pneumonia in the setting of COVID-19 positivity. 3. Trace left pleural effusion. 4. Aortic Atherosclerosis (ICD10-I70.0). 5. Coronary artery calcifications are present. Please note that the presence of coronary artery calcium documents the presence of coronary artery disease, the severity of this disease and any potential stenosis cannot be assessed on this non-gated CT examination. Electronically Signed   By: Lovena Le M.D.   On: 04/15/2020 16:00   US Abdomen Complete  Result Date: 04/10/2020 CLINICAL DATA:  Epigastric pain EXAM: ABDOMEN ULTRASOUND COMPLETE COMPARISON:  None. FINDINGS: Gallbladder: No gallstones or wall thickening visualized. No sonographic Murphy sign noted by sonographer. Common bile duct: Diameter: 3.3 mm Liver: Liver is echogenic. No focal hepatic abnormality. Portal vein is patent on color Doppler imaging with normal direction of blood flow towards the liver. IVC: No abnormality visualized. Pancreas: Visualized portion unremarkable. Spleen: Size and appearance within normal limits. Right Kidney: Length: 10 cm. Echogenicity within normal limits. No mass or hydronephrosis visualized. Left Kidney: Length: 10.4 cm. Echogenicity within normal limits. No hydronephrosis. Cyst off the lower pole measuring 3.1 cm. Abdominal aorta: No aneurysm visualized. Other findings: None.  IMPRESSION: 1. Negative for gallstones or biliary dilatation 2. Echogenic liver consistent with steatosis and or hepatocellular disease. 3. Simple appearing left renal cyst Electronically Signed   By: Donavan Foil M.D.   On: 04/10/2020 18:12  DG Chest Port 1 View  Result Date: 04/19/2020 CLINICAL DATA:  Shortness of breath, COVID positive. EXAM: PORTABLE CHEST 1 VIEW COMPARISON:  Chest x-ray dated 04/17/2020. FINDINGS: Heart size and mediastinal contours are within normal limits. Patchy ground-glass opacities are stable within each lung, again most dense at the RIGHT lung base. No pleural effusion or pneumothorax is seen. IMPRESSION: Stable appearance of the bilateral multifocal pneumonia. Electronically Signed   By: Franki Cabot M.D.   On: 04/19/2020 08:50   DG Chest Port 1 View  Result Date: 04/17/2020 CLINICAL DATA:  Shortness of breath, COVID positive EXAM: PORTABLE CHEST 1 VIEW COMPARISON:  04/16/2020 FINDINGS: No significant change in AP portable chest radiograph with heterogeneous airspace opacities present in the peripheral mid lungs and lung bases. No new airspace opacity. The heart and mediastinum are unremarkable. IMPRESSION: No significant change in AP portable chest radiograph with heterogeneous airspace opacities present in the peripheral mid lungs and lung bases, in keeping with COVID airspace disease. No new airspace opacity. Electronically Signed   By: Eddie Candle M.D.   On: 04/17/2020 10:23   DG Chest Port 1 View  Result Date: 04/16/2020 CLINICAL DATA:  Shortness of breath, COVID-19 positive. EXAM: PORTABLE CHEST 1 VIEW COMPARISON:  April 09, 2020. FINDINGS: The heart size and mediastinal contours are within normal limits. No pneumothorax or pleural effusion is noted. Stable mild diffuse interstitial reticular densities are noted in the lungs bilaterally concerning for atypical inflammation. The visualized skeletal structures are unremarkable. IMPRESSION: Stable mild diffuse  interstitial reticular densities are noted in the lungs bilaterally concerning for atypical inflammation. Electronically Signed   By: Marijo Conception M.D.   On: 04/16/2020 08:13   DG Chest Portable 1 View  Result Date: 04/11/2020 CLINICAL DATA:  Shortness of breath.  COVID-19 exposure EXAM: PORTABLE CHEST 1 VIEW COMPARISON:  01/13/2012 FINDINGS: The heart size and mediastinal contours are within normal limits. Atherosclerotic calcification of the aortic knob. Diffuse increased interstitial markings throughout both lungs. No pleural effusion or pneumothorax. The visualized skeletal structures are unremarkable. IMPRESSION: Diffuse increased interstitial markings throughout both lungs, which may reflect edema versus atypical/viral infection. Electronically Signed   By: Davina Poke D.O.   On: 03/19/2020 13:36   DG Abd Portable 1V  Result Date: 04/10/2020 CLINICAL DATA:  Abdominal pain. EXAM: PORTABLE ABDOMEN - 1 VIEW COMPARISON:  None. FINDINGS: The bowel gas pattern is normal. No radio-opaque calculi or other significant radiographic abnormality are seen. IMPRESSION: Negative. Electronically Signed   By: Marijo Conception M.D.   On: 04/10/2020 15:17   VAS [place] LOWER EXTREMITY VENOUS (DVT)  Result Date: 04/15/2020  Lower Venous DVTStudy Indications: Elevated d dimer >20, aware of recent negative doppler.  Comparison Study: 04/11/20 previous Performing Technologist: Abram Sander RVS  Examination Guidelines: A complete evaluation includes B-mode imaging, spectral Doppler, color Doppler, and power Doppler as needed of all accessible portions of each vessel. Bilateral testing is considered an integral part of a complete examination. Limited examinations for reoccurring indications may be performed as noted. The reflux portion of the exam is performed with the patient in reverse Trendelenburg.  +---------+---------------+---------+-----------+----------+--------------+ RIGHT     CompressibilityPhasicitySpontaneityPropertiesThrombus Aging +---------+---------------+---------+-----------+----------+--------------+ CFV      Full           Yes      Yes                                 +---------+---------------+---------+-----------+----------+--------------+  SFJ      Full                                                        +---------+---------------+---------+-----------+----------+--------------+ FV Prox  Full                                                        +---------+---------------+---------+-----------+----------+--------------+ FV Mid   Full                                                        +---------+---------------+---------+-----------+----------+--------------+ FV DistalFull                                                        +---------+---------------+---------+-----------+----------+--------------+ PFV      Full                                                        +---------+---------------+---------+-----------+----------+--------------+ POP      Full           Yes      Yes                                 +---------+---------------+---------+-----------+----------+--------------+ PTV      Full                                                        +---------+---------------+---------+-----------+----------+--------------+ PERO     Full                                                        +---------+---------------+---------+-----------+----------+--------------+   +---------+---------------+---------+-----------+----------+--------------+ LEFT     CompressibilityPhasicitySpontaneityPropertiesThrombus Aging +---------+---------------+---------+-----------+----------+--------------+ CFV      Full           Yes      Yes                                 +---------+---------------+---------+-----------+----------+--------------+ SFJ      Full                                                         +---------+---------------+---------+-----------+----------+--------------+  FV Prox  Full                                                        +---------+---------------+---------+-----------+----------+--------------+ FV Mid   Full                                                        +---------+---------------+---------+-----------+----------+--------------+ FV DistalFull                                                        +---------+---------------+---------+-----------+----------+--------------+ PFV      Full                                                        +---------+---------------+---------+-----------+----------+--------------+ POP      Full           Yes      Yes                                 +---------+---------------+---------+-----------+----------+--------------+ PTV      Full                                                        +---------+---------------+---------+-----------+----------+--------------+ PERO     Full                                                        +---------+---------------+---------+-----------+----------+--------------+     Summary: BILATERAL: - No evidence of deep vein thrombosis seen in the lower extremities, bilaterally. - No evidence of superficial venous thrombosis in the lower extremities, bilaterally. -   *See table(s) above for measurements and observations. Electronically signed by Harold Barban MD on 04/15/2020 at 8:50:57 PM.    Final    VAS [place] LOWER EXTREMITY VENOUS (DVT)  Result Date: 04/11/2020  Lower Venous DVTStudy Indications: Elevated Ddimer.  Risk Factors: COVID 19 positive. Comparison Study: No prior studies. Performing Technologist: Oliver Hum RVT  Examination Guidelines: A complete evaluation includes B-mode imaging, spectral Doppler, color Doppler, and power Doppler as needed of all accessible portions of each vessel. Bilateral testing is considered an integral part  of a complete examination. Limited examinations for reoccurring indications may be performed as noted. The reflux portion of the exam is performed with the patient in reverse Trendelenburg.  +---------+---------------+---------+-----------+----------+--------------+ RIGHT    CompressibilityPhasicitySpontaneityPropertiesThrombus Aging +---------+---------------+---------+-----------+----------+--------------+ CFV      Full  Yes      Yes                                 +---------+---------------+---------+-----------+----------+--------------+ SFJ      Full                                                        +---------+---------------+---------+-----------+----------+--------------+ FV Prox  Full                                                        +---------+---------------+---------+-----------+----------+--------------+ FV Mid   Full                                                        +---------+---------------+---------+-----------+----------+--------------+ FV DistalFull                                                        +---------+---------------+---------+-----------+----------+--------------+ PFV      Full                                                        +---------+---------------+---------+-----------+----------+--------------+ POP      Full           Yes      Yes                                 +---------+---------------+---------+-----------+----------+--------------+ PTV      Full                                                        +---------+---------------+---------+-----------+----------+--------------+ PERO     Full                                                        +---------+---------------+---------+-----------+----------+--------------+   +---------+---------------+---------+-----------+----------+--------------+ LEFT     CompressibilityPhasicitySpontaneityPropertiesThrombus Aging  +---------+---------------+---------+-----------+----------+--------------+ CFV      Full           Yes      Yes                                 +---------+---------------+---------+-----------+----------+--------------+ SFJ  Full                                                        +---------+---------------+---------+-----------+----------+--------------+ FV Prox  Full                                                        +---------+---------------+---------+-----------+----------+--------------+ FV Mid   Full                                                        +---------+---------------+---------+-----------+----------+--------------+ FV DistalFull                                                        +---------+---------------+---------+-----------+----------+--------------+ PFV      Full                                                        +---------+---------------+---------+-----------+----------+--------------+ POP      Full           Yes      Yes                                 +---------+---------------+---------+-----------+----------+--------------+ PTV      Full                                                        +---------+---------------+---------+-----------+----------+--------------+ PERO     Full                                                        +---------+---------------+---------+-----------+----------+--------------+     Summary: RIGHT: - There is no evidence of deep vein thrombosis in the lower extremity.  - No cystic structure found in the popliteal fossa.  LEFT: - There is no evidence of deep vein thrombosis in the lower extremity.  - No cystic structure found in the popliteal fossa.  *See table(s) above for measurements and observations. Electronically signed by Servando Snare MD on 04/11/2020 at 4:56:04 PM.    Final

## 2020-04-23 DIAGNOSIS — F329 Major depressive disorder, single episode, unspecified: Secondary | ICD-10-CM

## 2020-04-23 DIAGNOSIS — G43809 Other migraine, not intractable, without status migrainosus: Secondary | ICD-10-CM

## 2020-04-23 DIAGNOSIS — K219 Gastro-esophageal reflux disease without esophagitis: Secondary | ICD-10-CM

## 2020-04-23 DIAGNOSIS — F419 Anxiety disorder, unspecified: Secondary | ICD-10-CM

## 2020-04-23 DIAGNOSIS — E7849 Other hyperlipidemia: Secondary | ICD-10-CM

## 2020-04-23 MED ORDER — GLYCOPYRROLATE 0.2 MG/ML IJ SOLN
0.4000 mg | Freq: Three times a day (TID) | INTRAMUSCULAR | Status: DC
  Administered 2020-04-23 – 2020-04-26 (×7): 0.4 mg via INTRAVENOUS
  Filled 2020-04-23 (×11): qty 2

## 2020-04-23 NOTE — Progress Notes (Signed)
Palliative:  HPI:79 y.o.femalewith medical history significant ofhypertension, migraine headache, GERD, hyperlipidemia presents to emergency department with worsening shortness of breath.Patient is fully vaccinated, she presents with COVID-19 symptoms, she was noted to be hypoxic, admitted for further work-up,unfortunately she kept having increased oxygen requirement. She is followed by dermatology-has history of granuloma annulare-and takes hydroxychloroquine. Palliative care was asked to get involved to aid in [place] conversations in the setting of a ten day hospitalization with increasing O2 needs and no improvements.  I met again today at [name]'s bedside. She is alert and oriented even on morphine infusion. She is extremely weak and fatigued. No respiratory distress as she had yesterday. She is happy with her level of comfort at this time and does not desire any changes. I educated her on use of call bell if she has any symptoms that RN can give her extra medication for pain, shortness of breath, anxiety, or any discomfort. We discussed goals and hospice facility. She does not feel that she could tolerate a transfer from hospital at this time. She wishes to continue current level of oxygen as she is alert, comfortable, and able to have meaningful visits with her family. If she declines further, especially mental status, we would de-escalate oxygen but continue at current level for now. No plans for escalation of care either.   I assisted her with a fresh cup of ice water, warm washcloth for her face, and finding something to watch on television. I offered follow up chaplain visit but she says that this is not needed today that she plans to just rest.   I also discussed the above with son, [name], who only wants to follow his mother's stated wishes.   All questions/concerns addressed. Emotional support provided. Discussed with Dr. Bonner Puna.   Exam: Alert, no distress today. Breathing with less  tachypnea today but very poor reserve. Fatigued. Abd soft, flat. Extremities warm to touch.   Plan: - No changes to care.  - Not interested in hospice facility today.  - Comfort care but continue current oxygen for now.   110 min  Vinie Sill, NP Palliative Medicine Team Pager [phone] (Please see [email or web address] for schedule) Team Phone [phone]    Greater than 50%  of this time was spent counseling and coordinating care related to the above assessment and plan

## 2020-04-23 NOTE — Progress Notes (Signed)
PROGRESS NOTE  [name]  HQI:[id number] DOB: 1940-12-25 DOA: 03/25/2020 PCP: [name], MD   Brief Narrative: [name] is a vaccinated 79 y.o. 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 She was treated aggressively for acute hypoxemic respiratory failure due to covid-19, though her oxygenation worsened and she subsequently developed respiratory distress. Hospital medicine team and palliative care consultants facilitated goals of care discussions, ultimately eliciting the patient's wishes to convert to comfort measures on 04/20/2020. Morphine infusion was started, family visitation allowed. She continues to request comfort measures but wants to continue supplemental oxygen and does not feel strong enough to transfer to residential hospice, so hospital death is anticipated.  Assessment & Plan: Principal Problem:   Acute hypoxemic respiratory failure due to COVID-19 [facility]) Active Problems:   Hyperlipidemia   Anxiety and depression   Migraine headache   Essential hypertension   GERD   Elevated liver enzymes   Palliative care by specialist   Goals of care, counseling/discussion   Terminal care  Acute hypoxic respiratory failure due to covid-19 pneumonia despite full vaccination: Has incurred severe parenchymal lung injury, she is severely hypoxic despite standard of care treatments including IV steroids, remdesivir and baricitinib. Having completed >14 days of this and with conversion to comfort measures, these will be stopped. Unasyn was also given for possibility of superimposed aspiration pneumonia. Diuresis was also trialed with no appreciable improvement. CTA chest ruled out PE, though lovenox at higher than prophylactic doses was also provided.  - Continue supplemental oxygen in addition to morphine infusion for dyspnea. No  target for SpO2, though she's remaining in 70%'s, will judge by respiratory effort. We appear to be achieving the goal of alleviating suffering while allowing the patient to remain lucid to visit with family.  - Broached option of trying to transfer to residential hospice which the patient does not want.   Dry mouth: Stop scopolamine.   Elevated D-dimer. With negative CTA and leg ultrasound, was given full dose Lovenox.  Transaminitis due to COVID-19 viral infection. Stable ultrasound showing fatty liver.  Abdominal pain. Resolved. Likely due to constipation, stable abdominal ultrasound.  Essential hypertension. Currently stable off of medications.  GERD: Tx symptomatically  Dyslipidemia: Stop statin based on GOC.  HX of chronic back pain and migraine headaches: Continue pain control.  DVT prophylaxis: None (comfort measures Code Status: DNR Family Communication: None at bedside. Palliative care consultant spoke with family again today. Will touch base with them later. Disposition Plan:  Status is: Inpatient. Anticipate inpatient death in the coming hours to days.  Consultants:   Palliative care  Procedures:   None  Antimicrobials:  Remdesivir  Unasyn   Subjective: Dry mouth, though breathing a little easier than yesterday. Tearful about the end of her life, says she's trying to find peace. Happy with her current situation of morphine and supplemental oxygen. Does not want to move from this hospital room.  Objective: Vitals:   04/22/20 1256 04/22/20 1342 04/22/20 1757 04/22/20 1959  BP:  (!) 86/35 (!) 120/49 (!) 120/53  Pulse:  72 68 98  Resp: (!) 28 (!) 26 (!) 24 (!) 23  Temp:  97.8 F (36.6 C) 98.4 F (36.9 C) 98.2 F (36.8 C)  TempSrc:  Oral Oral Oral  SpO2: (!) 88% (!) 70% (!) 74% (!) 75%  Weight:  Height:        Intake/Output Summary (Last 24 hours) at 04/23/2020 1708 Last data filed at 04/22/2020 1800 Gross per 24 hour  Intake 79.51 ml    Output 350 ml  Net -270.49 ml   Filed Weights   03/24/2020 1645 04/10/20 2200  Weight: 72.5 kg 73 kg    Gen: Elderly female resting quietly Pulm: Tachypneic, crackles, no rattles. CV: Regular without JVD or significant pedal edema. GI: Abdomen soft, non-tender, non-distended, with normoactive bowel sounds.   Ext: Warm, no deformities Skin: No rashes, lesions or ulcers on visualized skin Neuro: Oriented and interactive, no focal deficits noted. Psych: Judgement and insight appear normal. Mood & affect appropriate.   Data Reviewed: I have personally reviewed following labs and imaging studies  CBC: Recent Labs  Lab 04/17/20 1336 04/18/20 1220 04/19/20 0138 04/20/20 0457  WBC 24.6* 31.2* 29.1* 24.4*  NEUTROABS  --   --   --  22.6*  HGB 12.6 13.3 12.9 11.5*  HCT 39.4 41.6 41.2 35.9*  MCV 93.6 93.7 92.6 93.7  PLT 528* 653* 525* 494*   Basic Metabolic Panel: Recent Labs  Lab 04/17/20 1336 04/18/20 1220 04/19/20 0138 04/20/20 0457  NA 141 140 140  --   K 4.4 4.5 5.1  --   CL 104 102 103  --   CO2 22 25 22   --   GLUCOSE 124* 115* 161*  --   BUN 27* 33* 35*  --   CREATININE 0.92 1.08* 1.01*  --   CALCIUM 8.6* 9.0 8.8*  --   MG 2.5* 2.7* 2.7* 2.6*   GFR: Estimated Creatinine Clearance: 44.9 mL/min (A) (by C-G formula based on SCr of 1.01 mg/dL (H)). Liver Function Tests: Recent Labs  Lab 04/17/20 1336 04/18/20 1220 04/19/20 0138  AST 39 37 34  ALT 75* 66* 58*  ALKPHOS 113 133* 119  BILITOT 1.0 0.6 0.8  PROT 6.1* 6.9 6.3*  ALBUMIN 2.6* 2.7* 2.4*   No results for input(s): LIPASE, AMYLASE in the last 168 hours. No results for input(s): AMMONIA in the last 168 hours. Coagulation Profile: No results for input(s): INR, PROTIME in the last 168 hours. Cardiac Enzymes: No results for input(s): CKTOTAL, CKMB, CKMBINDEX, TROPONINI in the last 168 hours. BNP (last 3 results) No results for input(s): PROBNP in the last 8760 hours. HbA1C: No results for input(s):  HGBA1C in the last 72 hours. CBG: Recent Labs  Lab 04/17/20 2100 04/18/20 0801 04/18/20 1145 04/18/20 1619  GLUCAP 137* 96 127* 228*   Lipid Profile: No results for input(s): CHOL, HDL, LDLCALC, TRIG, CHOLHDL, LDLDIRECT in the last 72 hours. Thyroid Function Tests: No results for input(s): TSH, T4TOTAL, FREET4, T3FREE, THYROIDAB in the last 72 hours. Anemia Panel: No results for input(s): VITAMINB12, FOLATE, FERRITIN, TIBC, IRON, RETICCTPCT in the last 72 hours. Urine analysis:    Component Value Date/Time   BILIRUBINUR 1+ 07/30/2014 1541   PROTEINUR 2+ 07/30/2014 1541   UROBILINOGEN 0.2 07/30/2014 1541   NITRITE n 07/30/2014 1541   LEUKOCYTESUR moderate (2+) 07/30/2014 1541   Recent Results (from the past 240 hour(s))  MRSA PCR Screening     Status: None   Collection Time: 04/17/20  3:14 PM   Specimen: Nasal Mucosa; Nasopharyngeal  Result Value Ref Range Status   MRSA by PCR NEGATIVE NEGATIVE Final    Comment:        The GeneXpert MRSA Assay (FDA approved for NASAL specimens only), is one component of a comprehensive MRSA  colonization surveillance program. It is not intended to diagnose MRSA infection nor to guide or monitor treatment for MRSA infections. Performed at [facility], [place] [address] [place], [place] [address]       Radiology Studies: No results found.  Scheduled Meds: . feeding supplement (ENSURE ENLIVE)  237 mL Oral BID BM  . glycopyrrolate  0.4 mg Intravenous TID   Continuous Infusions: . morphine 4 mg/hr (04/22/20 1800)     LOS: 14 days   Time spent: 25 minutes.  Patrecia Pour, MD [facility] [email or web address] 04/23/2020, 5:08 PM

## 2020-04-24 ENCOUNTER — Ambulatory Visit: Payer: Medicare Other | Admitting: Internal Medicine

## 2020-04-24 DIAGNOSIS — Z515 Encounter for palliative care: Secondary | ICD-10-CM | POA: Diagnosis not present

## 2020-04-24 LAB — GLUCOSE, CAPILLARY: Glucose-Capillary: 112 mg/dL — ABNORMAL HIGH (ref 70–99)

## 2020-04-24 NOTE — Plan of Care (Signed)
  Problem: Education: Goal: Knowledge of General Education information will improve Description: Including pain rating scale, medication(s)/side effects and non-pharmacologic comfort measures Outcome: Progressing   Problem: Health Behavior/Discharge Planning: Goal: Ability to manage health-related needs will improve Outcome: Progressing   Problem: Clinical Measurements: Goal: Will remain free from infection Outcome: Progressing Goal: Diagnostic test results will improve Outcome: Progressing   Problem: Education: Goal: Knowledge of risk factors and measures for prevention of condition will improve Outcome: Progressing   Problem: Coping: Goal: Psychosocial and spiritual needs will be supported Outcome: Progressing   Problem: Respiratory: Goal: Will maintain a patent airway Outcome: Progressing Goal: Complications related to the disease process, condition or treatment will be avoided or minimized Outcome: Progressing

## 2020-04-24 NOTE — Progress Notes (Addendum)
Palliative: ° °HPI: 79 y.o. female with medical history significant of hypertension, migraine headache, GERD, hyperlipidemia presents to emergency department with worsening shortness of breath.Patient is fully vaccinated, she presents with COVID-19 symptoms, she was noted to be hypoxic, admitted for further work-up , unfortunately she kept having increased oxygen requirement. She is followed by dermatology-has history of granuloma annulare - and takes hydroxychloroquine. Palliative care was asked to get involved to aid in G[place] conversations in the setting of a ten day hospitalization with increasing O2 needs and no improvements. ° °I met again today with [name]. No family at bedside. She continues to awaken and respond but seems a little confused today. Likely nearing stage for appropriate de-escalation of oxygen. Still able to eat and drink today. She does appear comfortable. Educated nursing on use of comfort medication and bolus as needed to achieve comfort.  ° °Exam: Alert, no distress today. Confused today. Breathing with less tachypnea still but very poor reserve and shallow. Fatigued. Abd soft, flat. Extremities warm to touch.  ° °Plan: °- Orders reviewed with no changes to provide comfort.  °- If she declines and/or confusion continues consider de-escalation of oxygen.  ° °15 min ° ° , NP °Palliative Medicine Team °Pager [phone] (Please see [email or web address] for schedule) °Team Phone [phone]  ° ° °Greater than 50%  of this time was spent counseling and coordinating care related to the above assessment and plan  °

## 2020-04-24 NOTE — Progress Notes (Signed)
PROGRESS NOTE  [name]  HUD:[id number] DOB: 11-06-1940 DOA: 03/31/2020 PCP: [name], MD   Brief Narrative: [name] is a vaccinated 79 y.o. 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 She was treated aggressively for acute hypoxemic respiratory failure due to covid-19, though her oxygenation worsened and she subsequently developed respiratory distress. Hospital medicine team and palliative care consultants facilitated goals of care discussions, ultimately eliciting the patient's wishes to convert to comfort measures on 04/20/2020. Morphine infusion was started, family visitation allowed. She continues to request comfort measures but wants to continue supplemental oxygen and does not feel strong enough to transfer to residential hospice, so hospital death is anticipated.  Assessment & Plan: Principal Problem:   Acute hypoxemic respiratory failure due to COVID-19 [facility]) Active Problems:   Hyperlipidemia   Anxiety and depression   Migraine headache   Essential hypertension   GERD   Elevated liver enzymes   Palliative care by specialist   Goals of care, counseling/discussion   Terminal care  Acute hypoxic respiratory failure due to covid-19 pneumonia despite full vaccination: Has incurred severe parenchymal lung injury, she is severely hypoxic despite standard of care treatments including IV steroids, remdesivir and baricitinib. Having completed >14 days of this and with conversion to comfort measures, these will be stopped. Unasyn was also given for possibility of superimposed aspiration pneumonia. Diuresis was also trialed with no appreciable improvement. CTA chest ruled out PE, though lovenox at higher than prophylactic doses was also provided.  - Continue supplemental oxygen in addition to morphine infusion for dyspnea. No  target for SpO2, though she's remaining in 70%'s, will judge by respiratory effort. We appear to be achieving the goal of alleviating suffering while allowing the patient to remain lucid to visit with family.  - Patient does not wish to attempt transfer to residential hospice and is becoming too unstable to do so. Anticipate hospital demise.  - Continue morphine gtt at 4mg /hr, bolus from infusion if needed.  - Continue robinul prn  Dry mouth: Stopped scopolamine. Comfort feeds/fluids.   Elevated D-dimer. With negative CTA and leg ultrasound, was given full dose Lovenox.  Transaminitis due to COVID-19 viral infection. Stable ultrasound showing fatty liver.  Abdominal pain. Resolved. Likely due to constipation, stable abdominal ultrasound.  Essential hypertension. Currently stable off of medications.  GERD: Tx symptomatically  Dyslipidemia: Stop statin based on GOC.  HX of chronic back pain and migraine headaches: Continue pain control.  DVT prophylaxis: None (comfort measures) Code Status: DNR Family Communication: None at bedside. Called spouse at home and mobile numbers without answer or ability to leave voicemail. Spoke with son by phone. Disposition Plan:  Status is: Inpatient. Anticipate inpatient death in the coming hours to days.  Consultants:   Palliative care  Procedures:   None  Antimicrobials:  Remdesivir  Unasyn   Subjective: Says she's comfortable. Doesn't want more morphine, doesn't want to cut down oxygen. Continues to opt for comfort measures only, though less interactive with me today.   Objective: Vitals:   04/22/20 1342 04/22/20 1757 04/22/20 1959 04/24/20 0600  BP: (!) 86/35 (!) 120/49 (!) 120/53 (!) 154/43  Pulse: 72 68 98 (!) 109  Resp: (!) 26 (!) 24 (!) 23 20  Temp: 97.8 F (36.6 C) 98.4 F (36.9 C) 98.2 F (36.8 C) 99.6 F (37.6 C)  TempSrc:  Oral Oral Oral Oral  SpO2: (!) 70% (!) 74% (!) 75% (!) 80%  Weight:      Height:         Intake/Output Summary (Last 24 hours) at 04/24/2020 1413 Last data filed at 04/24/2020 0600 Gross per 24 hour  Intake 536.16 ml  Output 900 ml  Net -363.84 ml   Filed Weights   04/04/2020 1645 04/10/20 2200  Weight: 72.5 kg 73 kg   Gen: Elderly frail female calm in bed.  Pulm: Tachypneic without accessory muscle use. CV: Regular rate and rhythm. No murmur, rub, or gallop. No JVD, no dependent edema. GI: Abdomen soft, non-tender, non-distended, with normoactive bowel sounds.  Ext: Warm, some cyanosis Neuro: Alert and oriented, though less interactive and diffusely very very weak.  Data Reviewed: I have personally reviewed following labs and imaging studies  CBC: Recent Labs  Lab 04/18/20 1220 04/19/20 0138 04/20/20 0457  WBC 31.2* 29.1* 24.4*  NEUTROABS  --   --  22.6*  HGB 13.3 12.9 11.5*  HCT 41.6 41.2 35.9*  MCV 93.7 92.6 93.7  PLT 653* 525* 703*   Basic Metabolic Panel: Recent Labs  Lab 04/18/20 1220 04/19/20 0138 04/20/20 0457  NA 140 140  --   K 4.5 5.1  --   CL 102 103  --   CO2 25 22  --   GLUCOSE 115* 161*  --   BUN 33* 35*  --   CREATININE 1.08* 1.01*  --   CALCIUM 9.0 8.8*  --   MG 2.7* 2.7* 2.6*   GFR: Estimated Creatinine Clearance: 44.9 mL/min (A) (by C-G formula based on SCr of 1.01 mg/dL (H)). Liver Function Tests: Recent Labs  Lab 04/18/20 1220 04/19/20 0138  AST 37 34  ALT 66* 58*  ALKPHOS 133* 119  BILITOT 0.6 0.8  PROT 6.9 6.3*  ALBUMIN 2.7* 2.4*   No results for input(s): LIPASE, AMYLASE in the last 168 hours. No results for input(s): AMMONIA in the last 168 hours. Coagulation Profile: No results for input(s): INR, PROTIME in the last 168 hours. Cardiac Enzymes: No results for input(s): CKTOTAL, CKMB, CKMBINDEX, TROPONINI in the last 168 hours. BNP (last 3 results) No results for input(s): PROBNP in the last 8760 hours. HbA1C: No results for input(s): HGBA1C in the last 72 hours. CBG: Recent Labs  Lab 04/17/20 2100  04/18/20 0801 04/18/20 1145 04/18/20 1619 04/24/20 1159  GLUCAP 137* 96 127* 228* 112*   Lipid Profile: No results for input(s): CHOL, HDL, LDLCALC, TRIG, CHOLHDL, LDLDIRECT in the last 72 hours. Thyroid Function Tests: No results for input(s): TSH, T4TOTAL, FREET4, T3FREE, THYROIDAB in the last 72 hours. Anemia Panel: No results for input(s): VITAMINB12, FOLATE, FERRITIN, TIBC, IRON, RETICCTPCT in the last 72 hours. Urine analysis:    Component Value Date/Time   BILIRUBINUR 1+ 07/30/2014 1541   PROTEINUR 2+ 07/30/2014 1541   UROBILINOGEN 0.2 07/30/2014 1541   NITRITE n 07/30/2014 1541   LEUKOCYTESUR moderate (2+) 07/30/2014 1541   Recent Results (from the past 240 hour(s))  MRSA PCR Screening     Status: None   Collection Time: 04/17/20  3:14 PM   Specimen: Nasal Mucosa; Nasopharyngeal  Result Value Ref Range Status   MRSA by PCR NEGATIVE NEGATIVE Final    Comment:        The GeneXpert MRSA Assay (FDA approved for NASAL specimens only), is one component of a comprehensive MRSA colonization surveillance program. It is not intended to diagnose MRSA infection nor to  guide or monitor treatment for MRSA infections. Performed at [facility], [place] [address], [place],  [address]       Radiology Studies: No results found.  Scheduled Meds: . feeding supplement (ENSURE ENLIVE)  237 mL Oral BID BM  . glycopyrrolate  0.4 mg Intravenous TID   Continuous Infusions: . morphine 4 mg/hr (04/24/20 0315)     LOS: 15 days   Time spent: 25 minutes.  Patrecia Pour, MD [facility] [email or web address] 04/24/2020, 2:13 PM

## 2020-04-25 NOTE — Plan of Care (Signed)
  Problem: Education: Goal: Knowledge of General Education information will improve Description: Including pain rating scale, medication(s)/side effects and non-pharmacologic comfort measures Outcome: Progressing   Problem: Health Behavior/Discharge Planning: Goal: Ability to manage health-related needs will improve Outcome: Progressing   Problem: Coping: Goal: Psychosocial and spiritual needs will be supported Outcome: Progressing   

## 2020-04-25 NOTE — Progress Notes (Signed)
Palliative Medicine Inpatient Follow Up Note   Reason for consult:  Goals of Care "DNR, advanced COVID-19 pneumonia, not improving, question transition to full comfort measures"  HPI:  Per intake H&P --> Cassandra A Suttonis a 79 y.o.femalewith medical history significant ofhypertension, migraine headache, GERD, hyperlipidemia presents to emergency department with worsening shortness of breath.  Patient is fully vaccinated, she presents with COVID-19 symptoms, she was noted to be hypoxic, admitted for further work-up,unfortunately she kept having increased oxygen requirement. 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 No history of smoking, alcohol, licit drug use.  Palliative care was asked to get involved to aid in [place] conversations in the setting of a ten day hospitalization with increasing O2 needs and no improvements.  Today's Discussion (04/25/2020): Chart reviewed. I met with [name] at bedside. I was joined by her RN, [name]. Patient was noted to be on HFNC and NRB. I spoke to her about comfort medications and that the use of morphine is to help her so that she does not experience air hunger. Endorsed that although oxygen can be helpful at such high concentrations it can be equally as harmful. Bolused with 45m of IV morphine and removed FM. Continued to educate M[place]onathroughout our time together. Was able to help to reposition her in the bed.   Questions and concerns addressed   SUMMARY OF RECOMMENDATIONS DNAR/DNI  Comfort measures - Plan to wean off oxygen and use morphine to aid in dyspnea  Spiritual Support  Code Status/Advance Care Planning: DNAR/DNI  Symptom Management: Dyspnea: Pain:                 - Morphine gtt w/ boluses Fever:                 - Tylenol 65116mPO/PR Q6H PRN Agitation: Anxiety:                 - Lorazepam 0.5-16m22mIV  Q1H PRN Nausea:                 - Zofran 4mg36mQ6H PRN       Secretions:                 - Scopolamine Patch Dry Eyes:                 - Artificial Tears PRN Xerostomia:                 - Biotene 15ml52m                 - BID oral care Urinary Retention:                 - Maintain foley  Constipation:                 - Bisacodyl 10mg 63mRN QDay Spiritual:                 - Chaplain consult  Time Spent: 35 Greater than 50% of the time was spent in counseling and coordination of care ______________________________________________________________________________________ [facility] Cell Phone: [phone] utilize secure chat with additional questions, if there is no response within 30 minutes please call the above phone number  Palliative Medicine Team providers are available by phone from 7am to 7pm daily and can be reached through the team cell phone.  Should this patient require assistance outside  of these hours, please call the patient's attending physician.

## 2020-04-25 NOTE — Progress Notes (Signed)
PROGRESS NOTE  [name]  TMH:[id number] DOB: [birth year] DOA: 04/11/2020 PCP: [name], MD   Brief Narrative: [name] is a vaccinated 79 y.o. 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 She was treated aggressively for acute hypoxemic respiratory failure due to covid-19, though her oxygenation worsened and she subsequently developed respiratory distress. Hospital medicine team and palliative care consultants facilitated goals of care discussions, ultimately eliciting the patient's wishes to convert to comfort measures on 04/20/2020. Morphine infusion was started, family visitation allowed. She continues to request comfort measures but wants to continue supplemental oxygen and does not feel strong enough to transfer to residential hospice, so hospital death is anticipated.  Assessment & Plan: Principal Problem:   Acute hypoxemic respiratory failure due to COVID-19 [facility]) Active Problems:   Hyperlipidemia   Anxiety and depression   Migraine headache   Essential hypertension   GERD   Elevated liver enzymes   Palliative care by specialist   Goals of care, counseling/discussion   Terminal care  Acute hypoxic respiratory failure due to covid-19 pneumonia despite full vaccination: Has incurred severe parenchymal lung injury, she is severely hypoxic despite standard of care treatments including IV steroids, remdesivir and baricitinib. Having completed >14 days of this and with conversion to comfort measures, these will be stopped. Unasyn was also given for possibility of superimposed aspiration pneumonia. Diuresis was also trialed with no appreciable improvement. CTA chest ruled out PE, though lovenox at higher than prophylactic doses was also provided.  - Continue supplemental oxygen in addition to morphine infusion for dyspnea.  - The  patient's son wants desperately to take the patient home. Unfortunately, I am certain that this is not currently a viable option. She remains at 15LPM with SpO2 consistently <85%. Now with NRB as well. I discussed at length with the patient and her son that this level of oxygen supplementation is only possible in hospital or hospice settings, and not at home. Were we to wean down/off oxygen, I would anticipate rapid respiratory distress which would limit our ability to move her to home.  - Patient continues to decline offer to attempt transfer to residential hospice. - Continue morphine gtt at 4mg /hr, bolus from infusion if needed.  - Continue robinul prn - Chaplain consulted, confirmed to the patient that she could have her minister present at bedside.  Dry mouth: Stopped scopolamine. Comfort feeds/fluids. Improved.  Elevated D-dimer. With negative CTA and leg ultrasound, was given full dose Lovenox.  Transaminitis due to COVID-19 viral infection. Stable ultrasound showing fatty liver.  Abdominal pain. Resolved. Likely due to constipation, stable abdominal ultrasound.  Essential hypertension. Currently stable off of medications.  GERD: Tx symptomatically  Dyslipidemia: Stop statin based on GOC.  HX of chronic back pain and migraine headaches: Continue pain control.  DVT prophylaxis: None (comfort measures) Code Status: DNR Family Communication: None at bedside. Spoke with son twice by phone yesterday at long lengths. Called and spoke again with him this afternoon. Disposition Plan:  Status is: Inpatient. Anticipate inpatient death in the coming hours to days.  Consultants:   Palliative care  Procedures:   None  Antimicrobials:  Remdesivir  Unasyn   Subjective: Needed addition of mask for oxygen supplementation. I recommended increasing morphine infusion to treat shortness of breath though she wishes not to do that at this time.   Objective:  Vitals:   04/22/20  1757 04/22/20 1959 04/24/20 0600 04/25/20 0400  BP: (!) 120/49 (!) 120/53 (!) 154/43 (!) 131/56  Pulse: 68 98 (!) 109 (!) 111  Resp: (!) 24 (!) 23 20 (!) 22  Temp: 98.4 F (36.9 C) 98.2 F (36.8 C) 99.6 F (37.6 C) 98.1 F (36.7 C)  TempSrc: Oral Oral Oral Axillary  SpO2: (!) 74% (!) 75% (!) 80% (!) 83%  Weight:      Height:        Intake/Output Summary (Last 24 hours) at 04/25/2020 1403 Last data filed at 04/25/2020 0500 Gross per 24 hour  Intake 87.42 ml  Output 1000 ml  Net -912.58 ml   Filed Weights   04/13/2020 1645 04/10/20 2200  Weight: 72.5 kg 73 kg   Gen: Frail, elderly female in no acute distress but uncomfortable appearing. Pulm: Tachypneic with 15L HFNC + NRB. Crackles diffusely. CV: Regular tachycardia without JVD or dependent edema. GI: Abdomen soft, non-tender, non-distended, with normoactive bowel sounds.  Ext: Warm, no deformities Skin: No new rashes, lesions or ulcers on visualized skin. Neuro: Drowsy but appropriately responsive, oriented. No focal neurological deficits. Psych: Judgement and insight appear fair.   Data Reviewed: I have personally reviewed following labs and imaging studies  CBC: Recent Labs  Lab 04/19/20 0138 04/20/20 0457  WBC 29.1* 24.4*  NEUTROABS  --  22.6*  HGB 12.9 11.5*  HCT 41.2 35.9*  MCV 92.6 93.7  PLT 525* 009*   Basic Metabolic Panel: Recent Labs  Lab 04/19/20 0138 04/20/20 0457  NA 140  --   K 5.1  --   CL 103  --   CO2 22  --   GLUCOSE 161*  --   BUN 35*  --   CREATININE 1.01*  --   CALCIUM 8.8*  --   MG 2.7* 2.6*   GFR: Estimated Creatinine Clearance: 44.9 mL/min (A) (by C-G formula based on SCr of 1.01 mg/dL (H)). Liver Function Tests: Recent Labs  Lab 04/19/20 0138  AST 34  ALT 58*  ALKPHOS 119  BILITOT 0.8  PROT 6.3*  ALBUMIN 2.4*   No results for input(s): LIPASE, AMYLASE in the last 168 hours. No results for input(s): AMMONIA in the last 168 hours. Coagulation Profile: No results for  input(s): INR, PROTIME in the last 168 hours. Cardiac Enzymes: No results for input(s): CKTOTAL, CKMB, CKMBINDEX, TROPONINI in the last 168 hours. BNP (last 3 results) No results for input(s): PROBNP in the last 8760 hours. HbA1C: No results for input(s): HGBA1C in the last 72 hours. CBG: Recent Labs  Lab 04/18/20 1619 04/24/20 1159  GLUCAP 228* 112*   Lipid Profile: No results for input(s): CHOL, HDL, LDLCALC, TRIG, CHOLHDL, LDLDIRECT in the last 72 hours. Thyroid Function Tests: No results for input(s): TSH, T4TOTAL, FREET4, T3FREE, THYROIDAB in the last 72 hours. Anemia Panel: No results for input(s): VITAMINB12, FOLATE, FERRITIN, TIBC, IRON, RETICCTPCT in the last 72 hours. Urine analysis:    Component Value Date/Time   BILIRUBINUR 1+ 07/30/2014 1541   PROTEINUR 2+ 07/30/2014 1541   UROBILINOGEN 0.2 07/30/2014 1541   NITRITE n 07/30/2014 1541   LEUKOCYTESUR moderate (2+) 07/30/2014 1541   Recent Results (from the past 240 hour(s))  MRSA PCR Screening     Status: None   Collection Time: 04/17/20  3:14 PM   Specimen: Nasal Mucosa; Nasopharyngeal  Result Value Ref Range Status   MRSA by PCR NEGATIVE NEGATIVE Final    Comment:  The GeneXpert MRSA Assay (FDA approved for NASAL specimens only), is one component of a comprehensive MRSA colonization surveillance program. It is not intended to diagnose MRSA infection nor to guide or monitor treatment for MRSA infections. Performed at [facility], [place] [address], [place], [place] [address]       Radiology Studies: No results found.  Scheduled Meds: . feeding supplement (ENSURE ENLIVE)  237 mL Oral BID BM  . glycopyrrolate  0.4 mg Intravenous TID   Continuous Infusions: . morphine 4 mg/hr (04/25/20 0420)     LOS: 16 days   Time spent: 25 minutes.  Patrecia Pour, MD [facility] [email or web address] 04/25/2020, 2:03 PM

## 2020-04-25 NOTE — Progress Notes (Signed)
   04/25/20 1607  Clinical Encounter Type  Visited With Patient  Visit Type Initial;Spiritual support  Referral From Palliative care team  Consult/Referral To Chaplain  Spiritual Encounters  Spiritual Needs Prayer  This chaplain responded to PMT consult for EOL spiritual care.  The Pt. is awake and responds she is pain free.  The Pt. attempts to respond to all communication from the chaplain, but struggles to hear on the first try. The Pt. shared she hopes to hear from her clergy at [facility] [phone].  The chaplain attempted phone call to [facility] is full. The Pt. accepts the invitation to pray with the chaplain and F/U spiritual care.

## 2020-05-16 NOTE — Death Summary Note (Addendum)
DEATH SUMMARY   Patient Details  Name: [name] MRN: [id number] DOB: 14-Nov-1940  Admission/Discharge Information   Admit Date:  Apr 27, 2020  Date of Death: Date of Death: May 14, 2020  Time of Death: Time of Death: 1125/11/23  Length of Stay: November 18, 2022  Referring Physician: [name], MD    Diagnoses  Preliminary cause of death:  Secondary Diagnoses (including complications and co-morbidities):  Principal Problem:   Acute hypoxemic respiratory failure due to COVID-19 [facility]) Active Problems:   Hyperlipidemia   Anxiety and depression   Migraine headache   Essential hypertension   GERD   Elevated liver enzymes   Palliative care by specialist   Goals of care, counseling/discussion   Terminal care   Brief Hospital Course (including significant findings, care, treatment, and services provided and events leading to death)  [name] is a 80 y.o. 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 She was treated aggressively for acute hypoxemic respiratory failure due to covid-19, though her oxygenation worsened and she subsequently developed respiratory distress. SEPSIS RULED OUT. Hospital medicine team and palliative care consultants facilitated goals of care discussions, ultimately eliciting the patient's wishes to convert to comfort measures on 04/20/2020. Morphine infusion was started, family visitation allowed. She continues to request comfort measures but wants to continue supplemental oxygen and does not feel strong enough to transfer to residential hospice, so hospital death is anticipated.  Patient ultimately died at 11:27 AM on 2020/05/14.   Pertinent Labs and Studies  Significant Diagnostic Studies CT ANGIO CHEST PE W OR WO CONTRAST  Result Date: 04/15/2020 CLINICAL DATA:  PE suspected, COVID-19 positive EXAM: CT ANGIOGRAPHY  CHEST WITH CONTRAST TECHNIQUE: Multidetector CT imaging of the chest was performed using the standard protocol during bolus administration of intravenous contrast. Multiplanar CT image reconstructions and MIPs were obtained to evaluate the vascular anatomy. CONTRAST:  71mL OMNIPAQUE IOHEXOL 350 MG/ML SOLN COMPARISON:  Radiograph April 27, 2020 FINDINGS: Cardiovascular: Satisfactory opacification the pulmonary arteries to the segmental level. No pulmonary artery filling defects are identified. Central pulmonary arteries are normal caliber. Normal heart size. No pericardial effusion. Scattered coronary artery calcifications are present. Atherosclerotic plaque within the normal caliber aorta. No acute luminal abnormality nor periaortic stranding hemorrhage. Shared origin of the brachiocephalic and left common carotid artery. Minimal plaque in the otherwise normal proximal great vessels. Mediastinum/Nodes: Few scattered low-attenuation subcentimeter mediastinal and hilar nodes are present. No enlarged mediastinal, hilar or axillary adenopathy. No acute abnormality of the trachea or esophagus. Thyroid gland demonstrates few subcentimeter hypoattenuating foci, without significant enlargement of the gland. No further imaging follow-up is warranted in a patient of this age (Reference: J Am Coll Radiol. 11/23/2013 Feb;12(2): 143-50). Lungs/Pleura: There are multifocal areas of mixed ground-glass and consolidative opacity with a peripheral predominance with more dense opacification in the dependent portions of the lungs likely reflecting some superimposed atelectatic changes. There is a trace left effusion as well. No pneumothorax or right effusion. Small air cyst seen in the right lung base (5/67). Upper Abdomen: No acute abnormalities present in the visualized portions of the upper abdomen. Small accessory splenule Musculoskeletal: Multilevel discogenic and facet degenerative changes, maximal C5-6 where sclerotic, likely Modic type  endplate changes are present with some mild retrolisthesis. Exaggerated thoracic kyphosis is noted with a slight pectus deformity of the chest (Haller index 2.55). No acute or suspicious  osseous lesions. Review of the MIP images confirms the above findings. IMPRESSION: 1. No evidence of acute pulmonary embolism. 2. Multifocal areas of mixed ground-glass and consolidative opacity with a peripheral predominance with more dense opacification in the dependent portions of the lungs likely reflecting some superimposed atelectatic changes. Findings are favored to represent multifocal pneumonia in the setting of COVID-19 positivity. 3. Trace left pleural effusion. 4. Aortic Atherosclerosis (ICD10-I70.0). 5. Coronary artery calcifications are present. Please note that the presence of coronary artery calcium documents the presence of coronary artery disease, the severity of this disease and any potential stenosis cannot be assessed on this non-gated CT examination. Electronically Signed   By: Lovena Le M.D.   On: 04/15/2020 16:00   US Abdomen Complete  Result Date: 04/10/2020 CLINICAL DATA:  Epigastric pain EXAM: ABDOMEN ULTRASOUND COMPLETE COMPARISON:  None. FINDINGS: Gallbladder: No gallstones or wall thickening visualized. No sonographic Murphy sign noted by sonographer. Common bile duct: Diameter: 3.3 mm Liver: Liver is echogenic. No focal hepatic abnormality. Portal vein is patent on color Doppler imaging with normal direction of blood flow towards the liver. IVC: No abnormality visualized. Pancreas: Visualized portion unremarkable. Spleen: Size and appearance within normal limits. Right Kidney: Length: 10 cm. Echogenicity within normal limits. No mass or hydronephrosis visualized. Left Kidney: Length: 10.4 cm. Echogenicity within normal limits. No hydronephrosis. Cyst off the lower pole measuring 3.1 cm. Abdominal aorta: No aneurysm visualized. Other findings: None. IMPRESSION: 1. Negative for gallstones or  biliary dilatation 2. Echogenic liver consistent with steatosis and or hepatocellular disease. 3. Simple appearing left renal cyst Electronically Signed   By: Donavan Foil M.D.   On: 04/10/2020 18:12   DG Chest Port 1 View  Result Date: 04/19/2020 CLINICAL DATA:  Shortness of breath, COVID positive. EXAM: PORTABLE CHEST 1 VIEW COMPARISON:  Chest x-ray dated 04/17/2020. FINDINGS: Heart size and mediastinal contours are within normal limits. Patchy ground-glass opacities are stable within each lung, again most dense at the RIGHT lung base. No pleural effusion or pneumothorax is seen. IMPRESSION: Stable appearance of the bilateral multifocal pneumonia. Electronically Signed   By: Franki Cabot M.D.   On: 04/19/2020 08:50   DG Chest Port 1 View  Result Date: 04/17/2020 CLINICAL DATA:  Shortness of breath, COVID positive EXAM: PORTABLE CHEST 1 VIEW COMPARISON:  04/16/2020 FINDINGS: No significant change in AP portable chest radiograph with heterogeneous airspace opacities present in the peripheral mid lungs and lung bases. No new airspace opacity. The heart and mediastinum are unremarkable. IMPRESSION: No significant change in AP portable chest radiograph with heterogeneous airspace opacities present in the peripheral mid lungs and lung bases, in keeping with COVID airspace disease. No new airspace opacity. Electronically Signed   By: Eddie Candle M.D.   On: 04/17/2020 10:23   DG Chest Port 1 View  Result Date: 04/16/2020 CLINICAL DATA:  Shortness of breath, COVID-19 positive. EXAM: PORTABLE CHEST 1 VIEW COMPARISON:  April 09, 2020. FINDINGS: The heart size and mediastinal contours are within normal limits. No pneumothorax or pleural effusion is noted. Stable mild diffuse interstitial reticular densities are noted in the lungs bilaterally concerning for atypical inflammation. The visualized skeletal structures are unremarkable. IMPRESSION: Stable mild diffuse interstitial reticular densities are noted in the  lungs bilaterally concerning for atypical inflammation. Electronically Signed   By: Marijo Conception M.D.   On: 04/16/2020 08:13   DG Chest Portable 1 View  Result Date: 04/08/2020 CLINICAL DATA:  Shortness of breath.  COVID-19 exposure EXAM: PORTABLE  CHEST 1 VIEW COMPARISON:  01/13/2012 FINDINGS: The heart size and mediastinal contours are within normal limits. Atherosclerotic calcification of the aortic knob. Diffuse increased interstitial markings throughout both lungs. No pleural effusion or pneumothorax. The visualized skeletal structures are unremarkable. IMPRESSION: Diffuse increased interstitial markings throughout both lungs, which may reflect edema versus atypical/viral infection. Electronically Signed   By: Davina Poke D.O.   On: 03/29/2020 13:36   DG Abd Portable 1V  Result Date: 04/10/2020 CLINICAL DATA:  Abdominal pain. EXAM: PORTABLE ABDOMEN - 1 VIEW COMPARISON:  None. FINDINGS: The bowel gas pattern is normal. No radio-opaque calculi or other significant radiographic abnormality are seen. IMPRESSION: Negative. Electronically Signed   By: Marijo Conception M.D.   On: 04/10/2020 15:17   VAS [place] LOWER EXTREMITY VENOUS (DVT)  Result Date: 04/15/2020  Lower Venous DVTStudy Indications: Elevated d dimer >20, aware of recent negative doppler.  Comparison Study: 04/11/20 previous Performing Technologist: Abram Sander RVS  Examination Guidelines: A complete evaluation includes B-mode imaging, spectral Doppler, color Doppler, and power Doppler as needed of all accessible portions of each vessel. Bilateral testing is considered an integral part of a complete examination. Limited examinations for reoccurring indications may be performed as noted. The reflux portion of the exam is performed with the patient in reverse Trendelenburg.  +---------+---------------+---------+-----------+----------+--------------+ RIGHT    CompressibilityPhasicitySpontaneityPropertiesThrombus Aging  +---------+---------------+---------+-----------+----------+--------------+ CFV      Full           Yes      Yes                                 +---------+---------------+---------+-----------+----------+--------------+ SFJ      Full                                                        +---------+---------------+---------+-----------+----------+--------------+ FV Prox  Full                                                        +---------+---------------+---------+-----------+----------+--------------+ FV Mid   Full                                                        +---------+---------------+---------+-----------+----------+--------------+ FV DistalFull                                                        +---------+---------------+---------+-----------+----------+--------------+ PFV      Full                                                        +---------+---------------+---------+-----------+----------+--------------+ POP  Full           Yes      Yes                                 +---------+---------------+---------+-----------+----------+--------------+ PTV      Full                                                        +---------+---------------+---------+-----------+----------+--------------+ PERO     Full                                                        +---------+---------------+---------+-----------+----------+--------------+   +---------+---------------+---------+-----------+----------+--------------+ LEFT     CompressibilityPhasicitySpontaneityPropertiesThrombus Aging +---------+---------------+---------+-----------+----------+--------------+ CFV      Full           Yes      Yes                                 +---------+---------------+---------+-----------+----------+--------------+ SFJ      Full                                                         +---------+---------------+---------+-----------+----------+--------------+ FV Prox  Full                                                        +---------+---------------+---------+-----------+----------+--------------+ FV Mid   Full                                                        +---------+---------------+---------+-----------+----------+--------------+ FV DistalFull                                                        +---------+---------------+---------+-----------+----------+--------------+ PFV      Full                                                        +---------+---------------+---------+-----------+----------+--------------+ POP      Full           Yes      Yes                                 +---------+---------------+---------+-----------+----------+--------------+  PTV      Full                                                        +---------+---------------+---------+-----------+----------+--------------+ PERO     Full                                                        +---------+---------------+---------+-----------+----------+--------------+     Summary: BILATERAL: - No evidence of deep vein thrombosis seen in the lower extremities, bilaterally. - No evidence of superficial venous thrombosis in the lower extremities, bilaterally. -   *See table(s) above for measurements and observations. Electronically signed by Harold Barban MD on 04/15/2020 at 8:50:57 PM.    Final    VAS [place] LOWER EXTREMITY VENOUS (DVT)  Result Date: 04/11/2020  Lower Venous DVTStudy Indications: Elevated Ddimer.  Risk Factors: COVID 19 positive. Comparison Study: No prior studies. Performing Technologist: Oliver Hum RVT  Examination Guidelines: A complete evaluation includes B-mode imaging, spectral Doppler, color Doppler, and power Doppler as needed of all accessible portions of each vessel. Bilateral testing is considered an integral part of a complete  examination. Limited examinations for reoccurring indications may be performed as noted. The reflux portion of the exam is performed with the patient in reverse Trendelenburg.  +---------+---------------+---------+-----------+----------+--------------+ RIGHT    CompressibilityPhasicitySpontaneityPropertiesThrombus Aging +---------+---------------+---------+-----------+----------+--------------+ CFV      Full           Yes      Yes                                 +---------+---------------+---------+-----------+----------+--------------+ SFJ      Full                                                        +---------+---------------+---------+-----------+----------+--------------+ FV Prox  Full                                                        +---------+---------------+---------+-----------+----------+--------------+ FV Mid   Full                                                        +---------+---------------+---------+-----------+----------+--------------+ FV DistalFull                                                        +---------+---------------+---------+-----------+----------+--------------+ PFV      Full                                                        +---------+---------------+---------+-----------+----------+--------------+  POP      Full           Yes      Yes                                 +---------+---------------+---------+-----------+----------+--------------+ PTV      Full                                                        +---------+---------------+---------+-----------+----------+--------------+ PERO     Full                                                        +---------+---------------+---------+-----------+----------+--------------+   +---------+---------------+---------+-----------+----------+--------------+ LEFT     CompressibilityPhasicitySpontaneityPropertiesThrombus Aging  +---------+---------------+---------+-----------+----------+--------------+ CFV      Full           Yes      Yes                                 +---------+---------------+---------+-----------+----------+--------------+ SFJ      Full                                                        +---------+---------------+---------+-----------+----------+--------------+ FV Prox  Full                                                        +---------+---------------+---------+-----------+----------+--------------+ FV Mid   Full                                                        +---------+---------------+---------+-----------+----------+--------------+ FV DistalFull                                                        +---------+---------------+---------+-----------+----------+--------------+ PFV      Full                                                        +---------+---------------+---------+-----------+----------+--------------+ POP      Full           Yes      Yes                                 +---------+---------------+---------+-----------+----------+--------------+  PTV      Full                                                        +---------+---------------+---------+-----------+----------+--------------+ PERO     Full                                                        +---------+---------------+---------+-----------+----------+--------------+     Summary: RIGHT: - There is no evidence of deep vein thrombosis in the lower extremity.  - No cystic structure found in the popliteal fossa.  LEFT: - There is no evidence of deep vein thrombosis in the lower extremity.  - No cystic structure found in the popliteal fossa.  *See table(s) above for measurements and observations. Electronically signed by Servando Snare MD on 04/11/2020 at 4:56:04 PM.    Final     Microbiology Recent Results (from the past 240 hour(s))  MRSA PCR Screening     Status:  None   Collection Time: 04/17/20  3:14 PM   Specimen: Nasal Mucosa; Nasopharyngeal  Result Value Ref Range Status   MRSA by PCR NEGATIVE NEGATIVE Final    Comment:        The GeneXpert MRSA Assay (FDA approved for NASAL specimens only), is one component of a comprehensive MRSA colonization surveillance program. It is not intended to diagnose MRSA infection nor to guide or monitor treatment for MRSA infections. Performed at [facility], [place] [address] [place], [place] [address]     Lab Basic Metabolic Panel: Recent Labs  Lab 04/20/20 0457  MG 2.6*   Liver Function Tests: No results for input(s): AST, ALT, ALKPHOS, BILITOT, PROT, ALBUMIN in the last 168 hours. No results for input(s): LIPASE, AMYLASE in the last 168 hours. No results for input(s): AMMONIA in the last 168 hours. CBC: Recent Labs  Lab 04/20/20 0457  WBC 24.4*  NEUTROABS 22.6*  HGB 11.5*  HCT 35.9*  MCV 93.7  PLT 548*   Cardiac Enzymes: No results for input(s): CKTOTAL, CKMB, CKMBINDEX, TROPONINI in the last 168 hours. Sepsis Labs: Recent Labs  Lab 04/20/20 0457  PROCALCITON <0.10  WBC 24.4Little Ishikawa DO May 10, 2020, 11:39 AM

## 2020-05-16 NOTE — Progress Notes (Signed)
Pt passed away, Time of death verified by 2 RN's at 14 am. Family notified, MD notified. Family to call patient placement with a funeral home choice. No questions or concerns voiced to the Nurse.

## 2020-05-16 NOTE — Progress Notes (Signed)
   Palliative Medicine Inpatient Follow Up Note  Reason for consult:Goals of Care "DNR, advanced COVID-19 pneumonia, not improving, question transition to full comfort measures"  HPI: Per intake H&P -->Cassandra A Suttonis a 79 y.o.femalewith medical history significant ofhypertension, migraine headache, GERD, hyperlipidemia presents to emergency department with worsening shortness of breath.  Patient is fully vaccinated, she presents with COVID-19 symptoms, she was noted to be hypoxic, admitted for further work-up,unfortunately she kept having increased oxygen requirement. She is followed by dermatology-has history of granuloma annulare-and takes hydroxychloroquine. No history of smoking, alcohol, licit drug use.  Palliative care was asked to get involved to aid in [place] conversations in the setting of a ten day hospitalization with increasing O2 needs and no improvements.  Today's Discussion (May 04, 2020): Chart reviewed. Patient weaned off of NRB FM yesterday. This morning removed HFNC. Remains on morphine gtt. Is not alert this morning. Appears quite comfortable.   I was able to touch base with patients bedside RN, Rodman Pickle to discuss the plan.   Prognosis: Anticipate likely hours  Questions and concerns addressed   Ongoing PMT support  SUMMARY OF RECOMMENDATIONS DNAR/DNI  Comfort measures   Spiritual Support  Code Status/Advance Care Planning: DNAR/DNI  Symptom Management: Dyspnea: Pain: - Morphine gtt w/ boluses Fever: - Tylenol 650mg  PO/PR Q6H PRN Agitation: Anxiety: - Lorazepam 0.5-1mg  IV Q1H PRN Nausea: - Zofran 4mg  V Q6H PRN Secretions: - Scopolamine Patch Dry Eyes: - Artificial Tears PRN Xerostomia: - Biotene 75ml PRN - BID oral care Urinary Retention: - Maintain foley   Constipation: - Bisacodyl 10mg  PR PRN QDay Spiritual: - Chaplain consult    Time Spent: 25 Greater than 50% of the time was spent in counseling and coordination of care ______________________________________________________________________________________ [place] Team Team Cell Phone: [phone] Please utilize secure chat with additional questions, if there is no response within 30 minutes please call the above phone number  Palliative Medicine Team providers are available by phone from 7am to 7pm daily and can be reached through the team cell phone.  Should this patient require assistance outside of these hours, please call the patient's attending physician.

## 2020-05-16 DEATH — deceased

## 2020-05-21 ENCOUNTER — Other Ambulatory Visit: Payer: Self-pay | Admitting: Internal Medicine

## 2020-06-06 ENCOUNTER — Other Ambulatory Visit: Payer: Self-pay | Admitting: Internal Medicine

## 2021-09-10 IMAGING — DX DG CHEST 1V PORT
1 series · 1 of 1 positions shown · non-contrast
Comparison: 01/13/2012

CLINICAL DATA: Shortness of breath.  [id number] exposure

EXAM:
PORTABLE CHEST 1 VIEW

[chest ap]
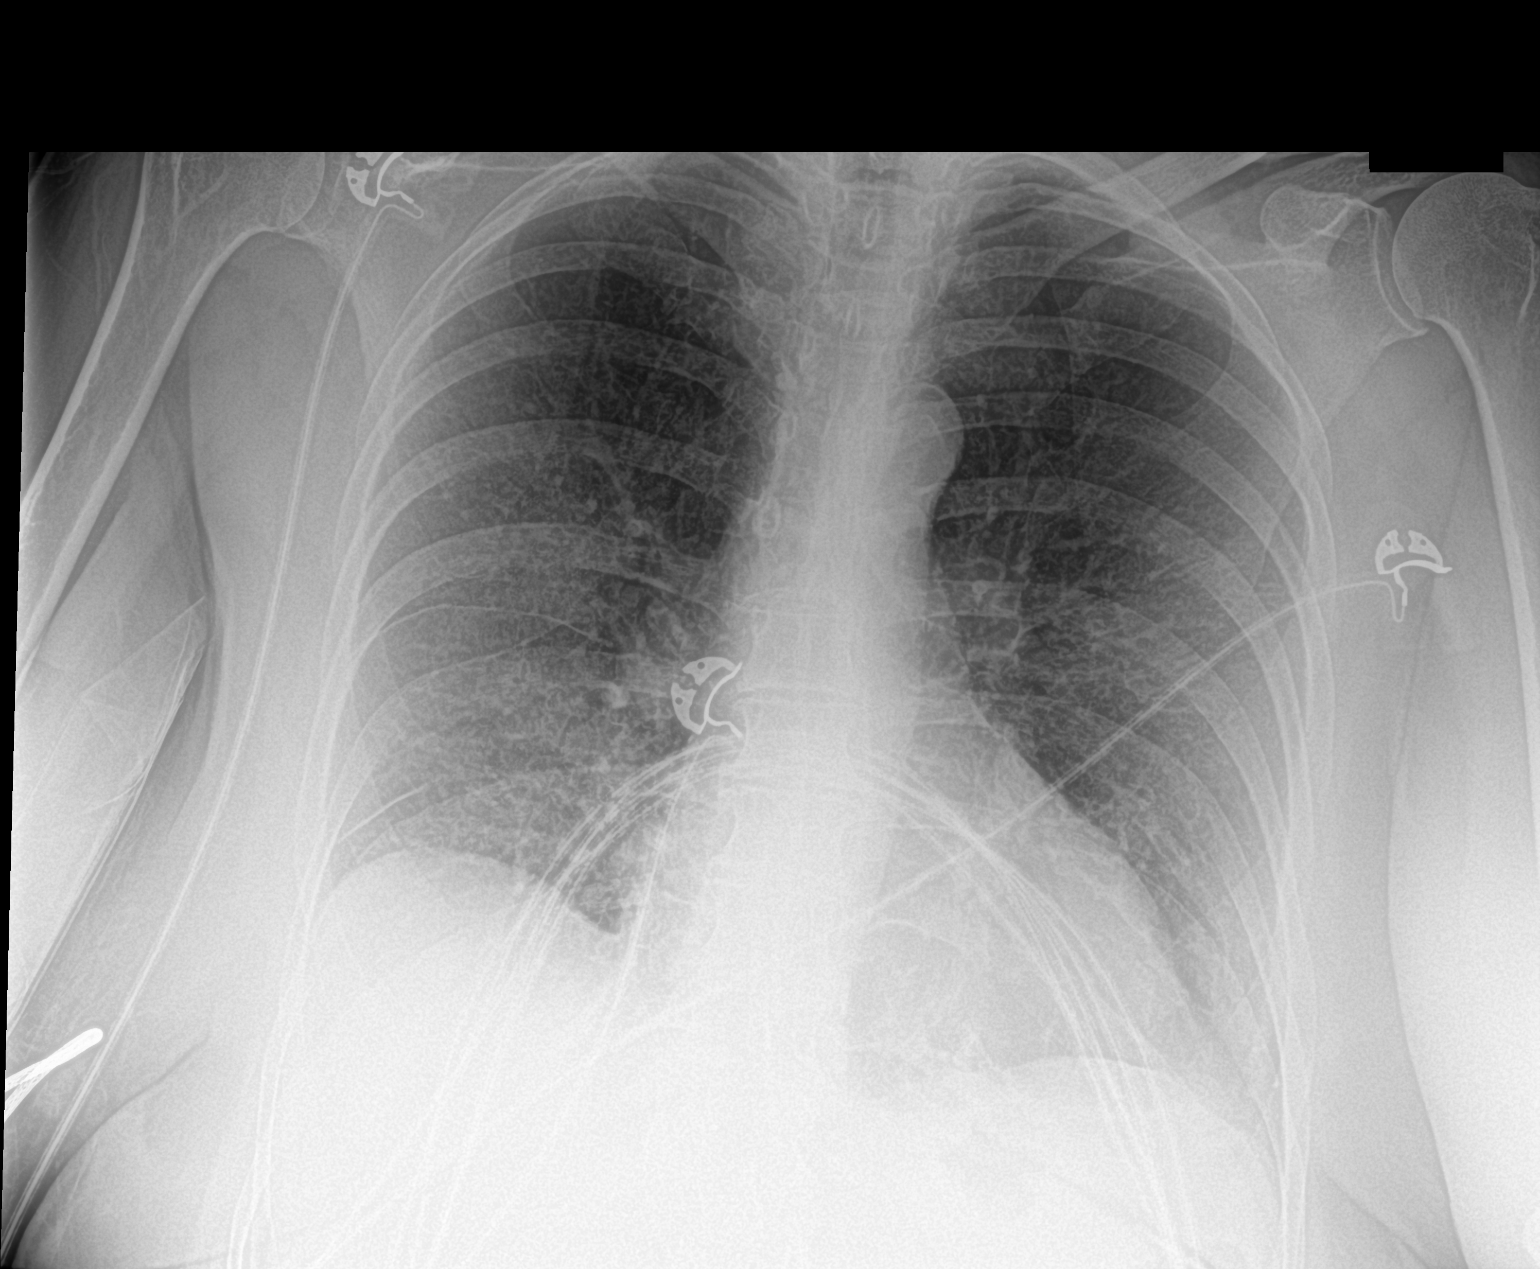

[1 of 1 positions shown; findings below may reference images not displayed]

FINDINGS: The heart size and mediastinal contours are within normal limits.
Atherosclerotic calcification of the aortic knob. Diffuse increased
interstitial markings throughout both lungs. No pleural effusion or
pneumothorax. The visualized skeletal structures are unremarkable.
IMPRESSION: Diffuse increased interstitial markings throughout both lungs, which
may reflect edema versus atypical/viral infection.

## 2021-09-11 IMAGING — DX DG ABD PORTABLE 1V
1 series · 1 of 1 positions shown · non-contrast
Comparison: None.

CLINICAL DATA: Abdominal pain.

EXAM:
PORTABLE ABDOMEN - 1 VIEW

[abdomen kub]
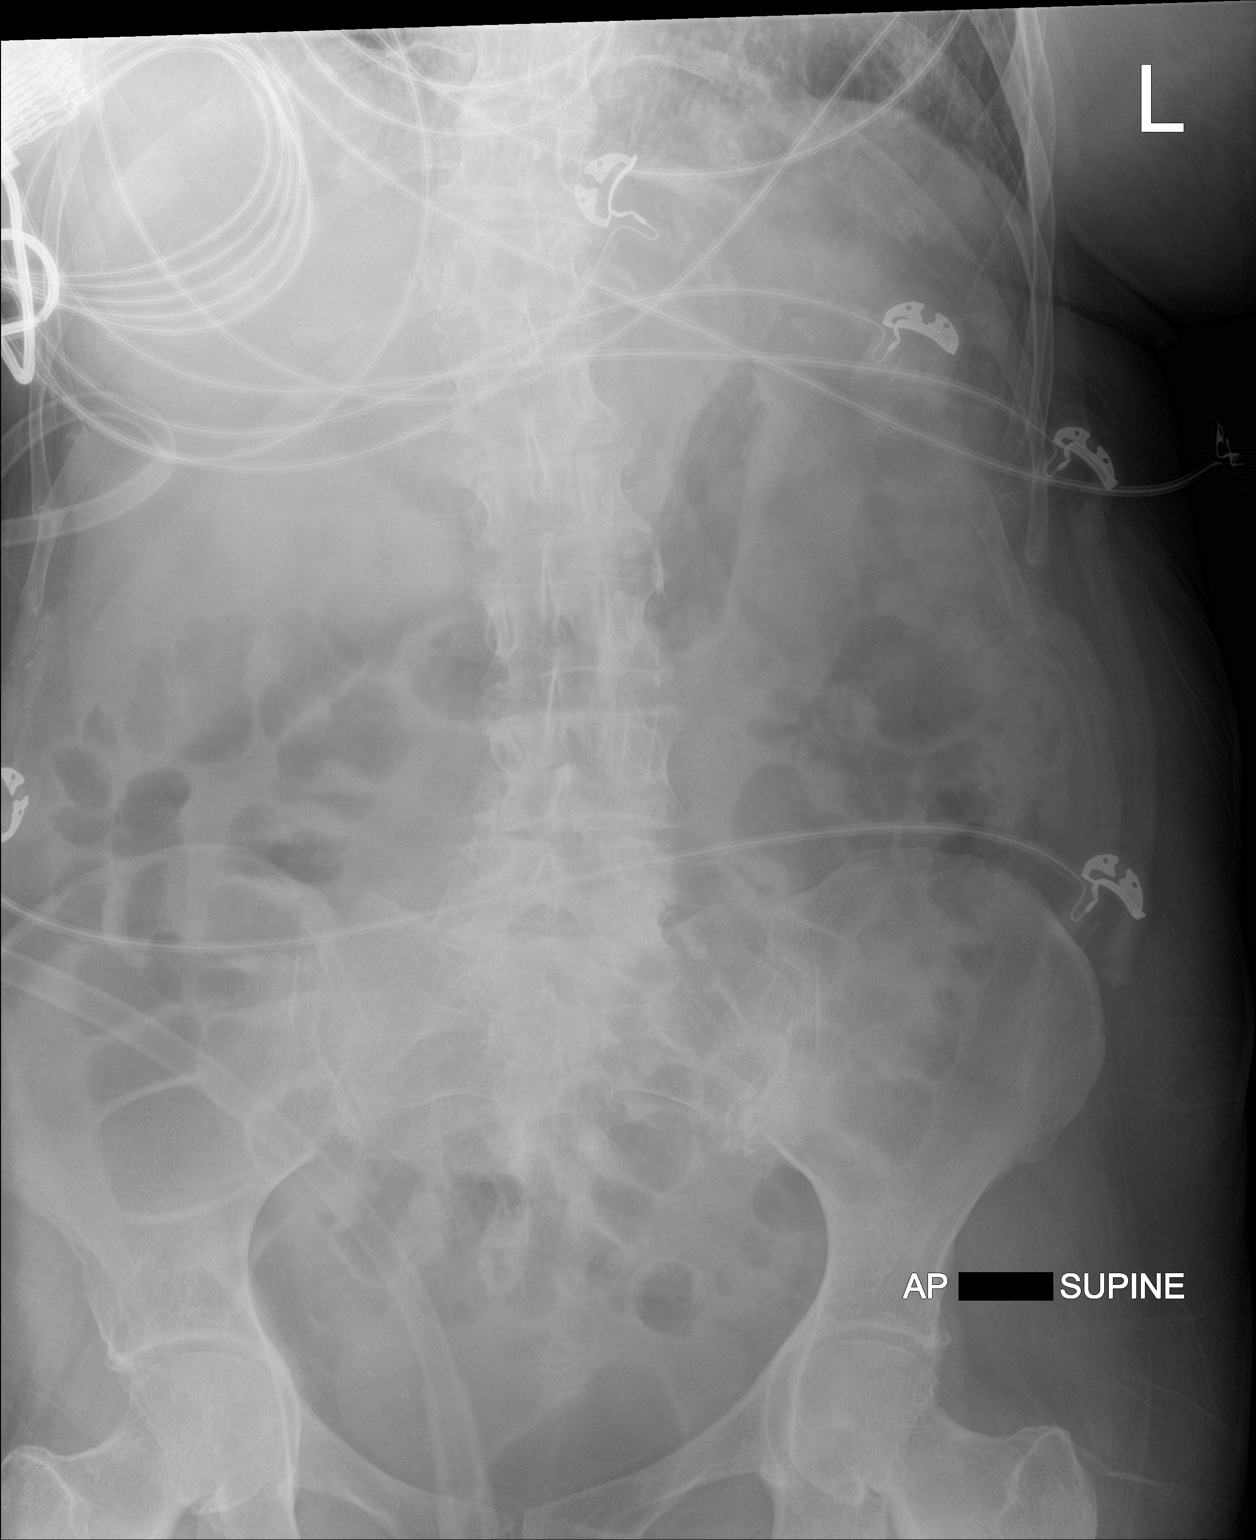

[1 of 1 positions shown; findings below may reference images not displayed]

FINDINGS: The bowel gas pattern is normal. No radio-opaque calculi or other
significant radiographic abnormality are seen.
IMPRESSION: Negative.

## 2021-09-11 IMAGING — US US ABDOMEN COMPLETE
1 series · 14 of 25 positions shown · non-contrast
Comparison: None.

CLINICAL DATA: Epigastric pain

EXAM:
ABDOMEN ULTRASOUND COMPLETE

[Series 1: us abdomen complete · 14 of 86 slices shown]
[im 1/86]
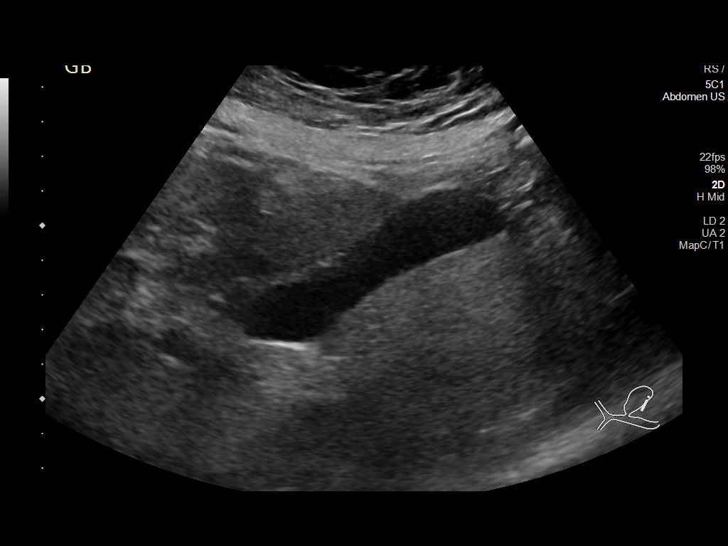
[im 8/86]
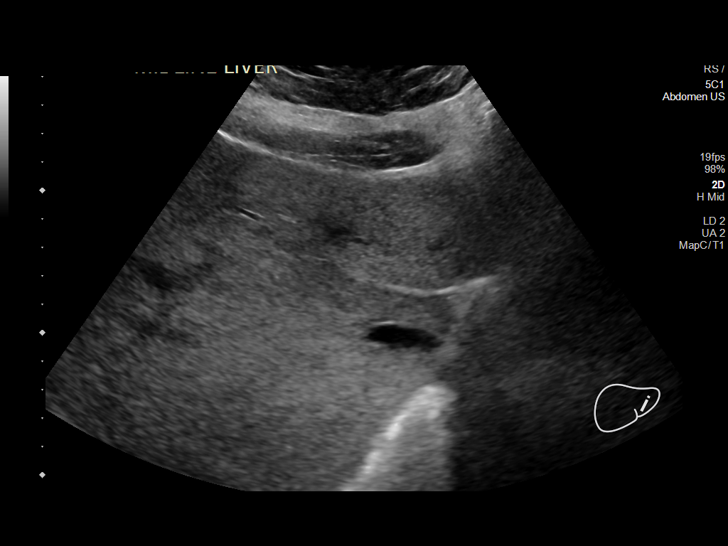
[im 15/86]
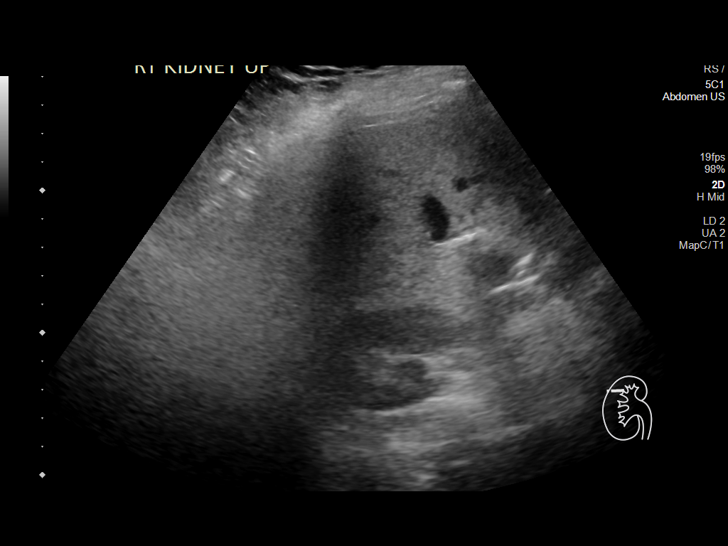
[im 22/86]
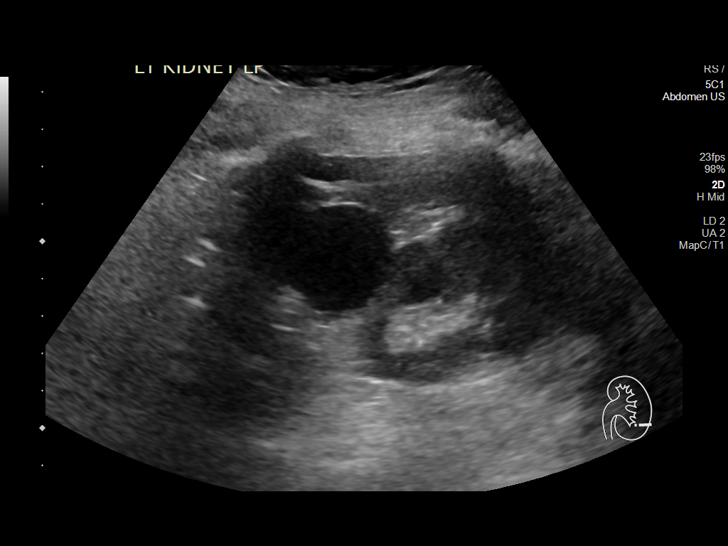
[im 29/86]
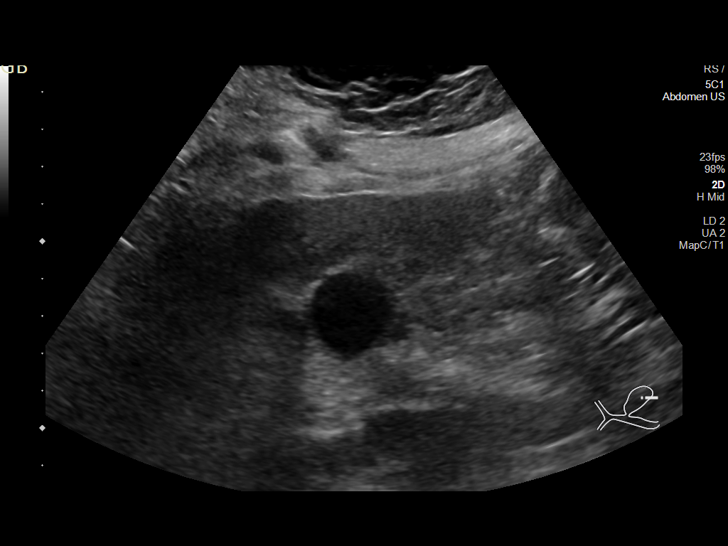
[im 32/86]
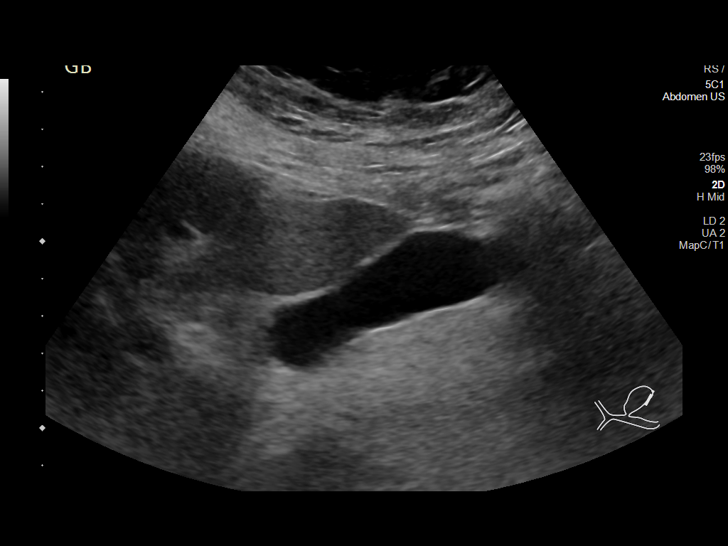
[im 39/86]
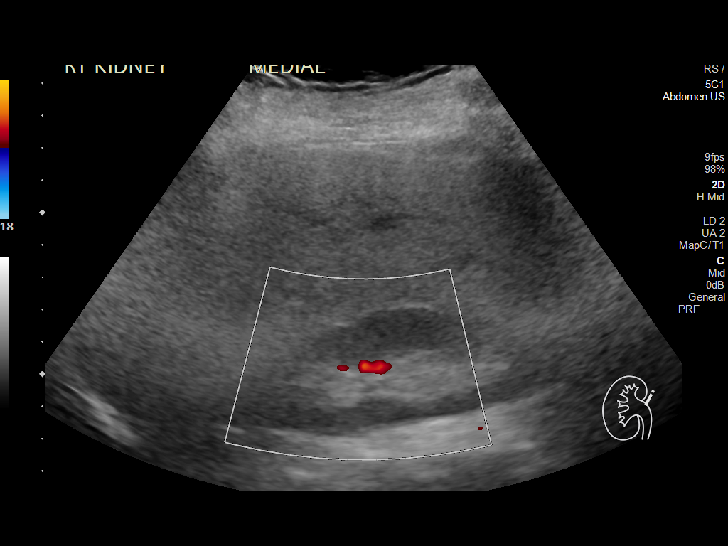
[im 47/86]
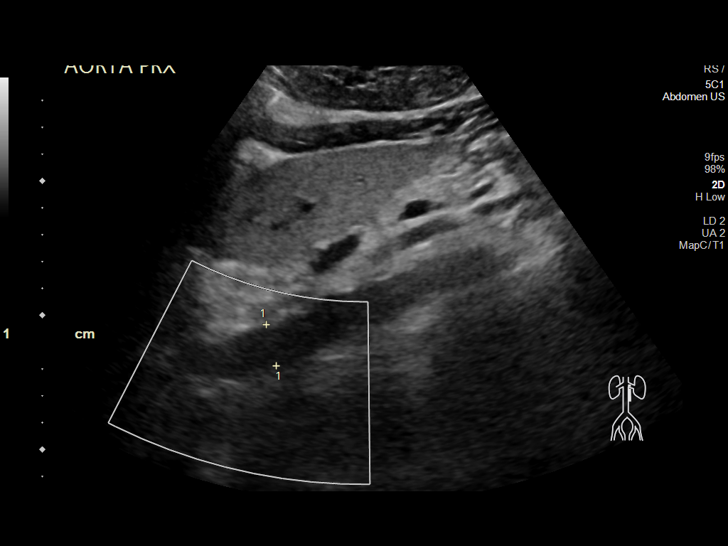
[im 54/86]
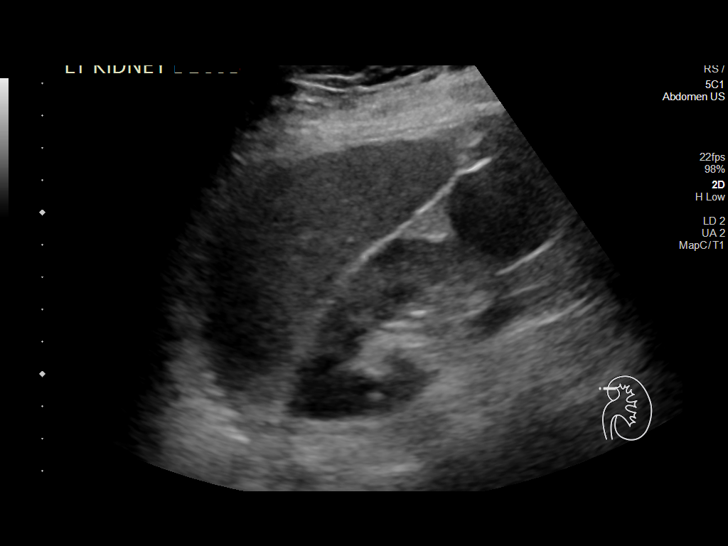
[im 57/86]
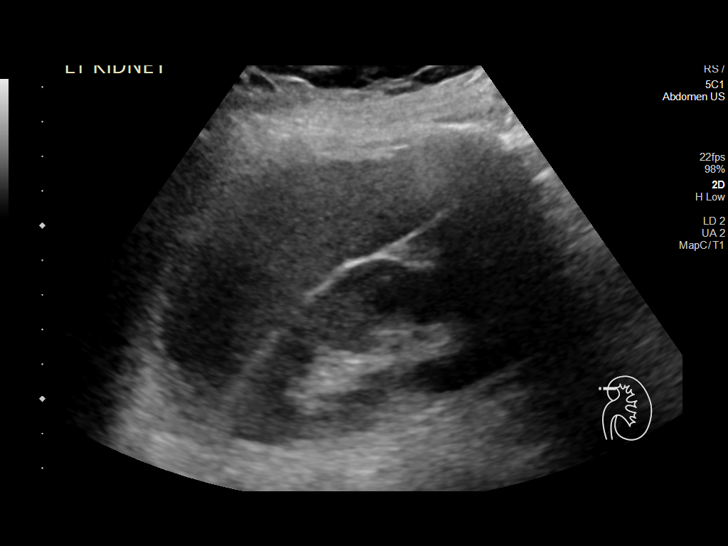
[im 64/86]
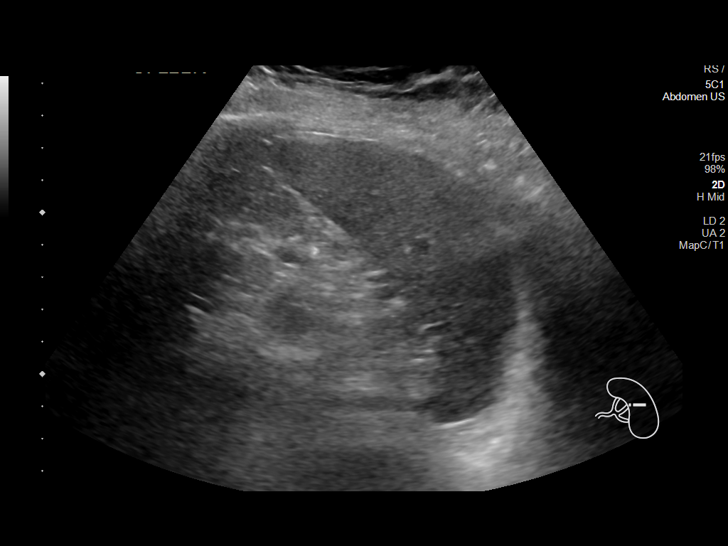
[im 71/86]
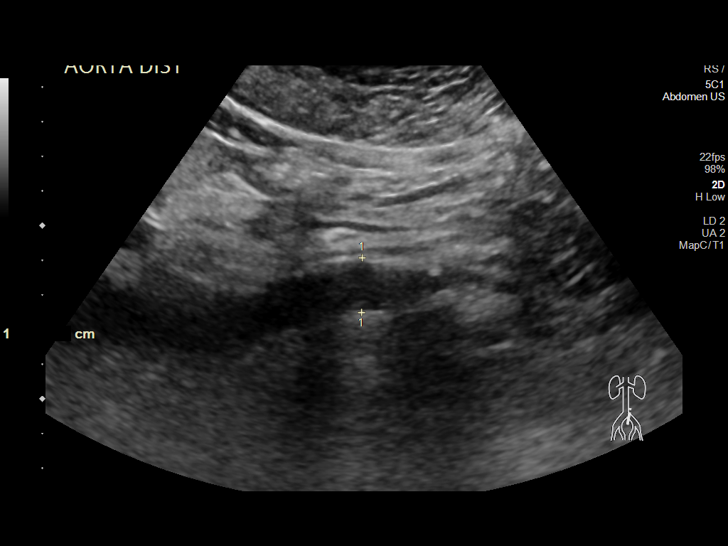
[im 78/86]
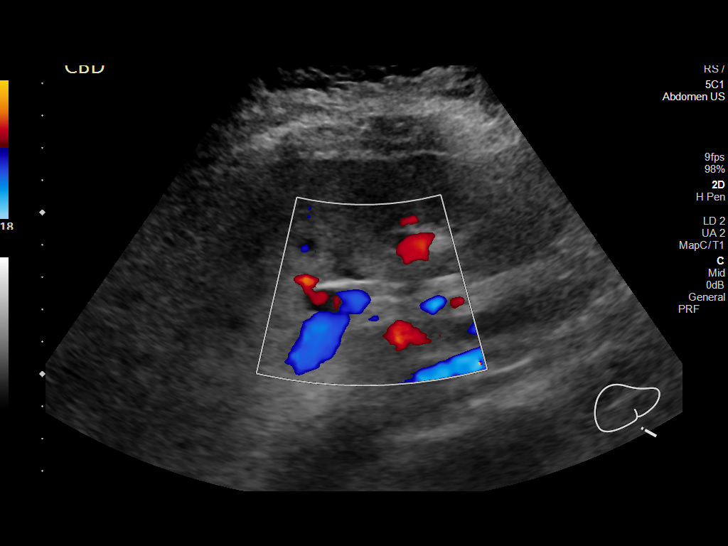
[im 86/86]
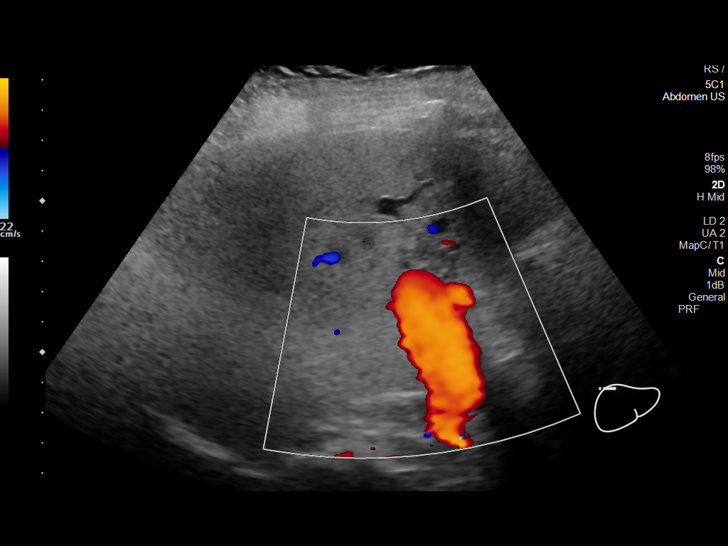

[14 of 25 positions shown; findings below may reference images not displayed]

FINDINGS: Gallbladder: No gallstones or wall thickening visualized. No
sonographic Murphy sign noted by sonographer.

Common bile duct: Diameter: 3.3 mm

Liver: Liver is echogenic. No focal hepatic abnormality. Portal vein
is patent on color Doppler imaging with normal direction of blood
flow towards the liver.

IVC: No abnormality visualized.

Pancreas: Visualized portion unremarkable.

Spleen: Size and appearance within normal limits.

Right Kidney: Length: 10 cm. Echogenicity within normal limits. No
mass or hydronephrosis visualized.

Left Kidney: Length: 10.4 cm. Echogenicity within normal limits. No
hydronephrosis. Cyst off the lower pole measuring 3.1 cm.

Abdominal aorta: No aneurysm visualized.

Other findings: None.
IMPRESSION: 1. Negative for gallstones or biliary dilatation
2. Echogenic liver consistent with steatosis and or hepatocellular
disease.
3. Simple appearing left renal cyst

## 2021-09-16 IMAGING — CT CT ANGIO CHEST
2 of 6 series · 17 of 46 positions shown · IV contrast (omnipaque)
Comparison: Radiograph 04/09/2020

CLINICAL DATA: PE suspected, [id number] positive

EXAM:
CT ANGIOGRAPHY CHEST WITH CONTRAST
TECHNIQUE: Multidetector CT imaging of the chest was performed using the
standard protocol during bolus administration of intravenous
contrast. Multiplanar CT image reconstructions and MIPs were
obtained to evaluate the vascular anatomy.
CONTRAST:  75mL OMNIPAQUE IOHEXOL 350 MG/ML SOLN

[Series 6: thins · axial · 0.77mm/px · z∈[+1108,+1388]mm · 14 of 308 slices shown]
[im 14/308  lung]
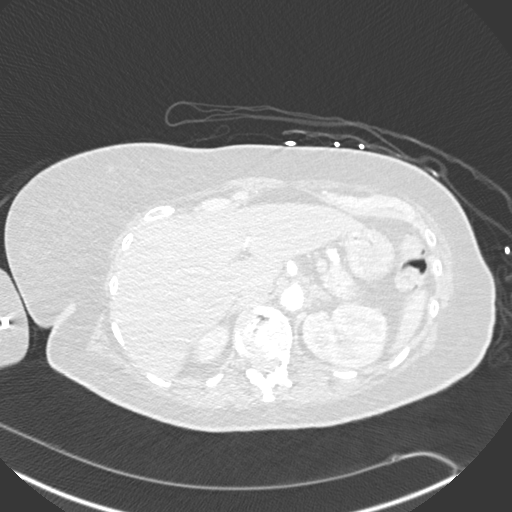
[im 41/308  soft-tissue]
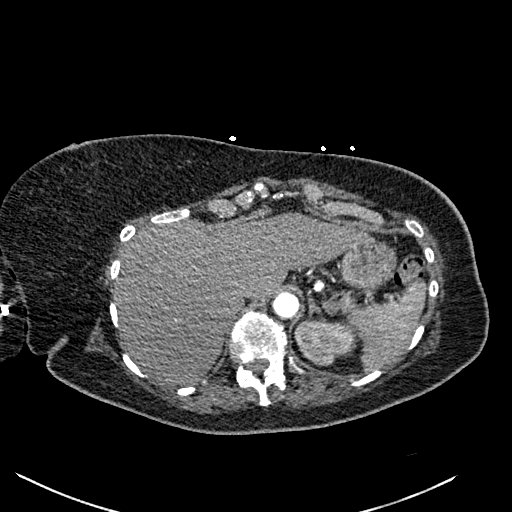
[im 54/308  lung]
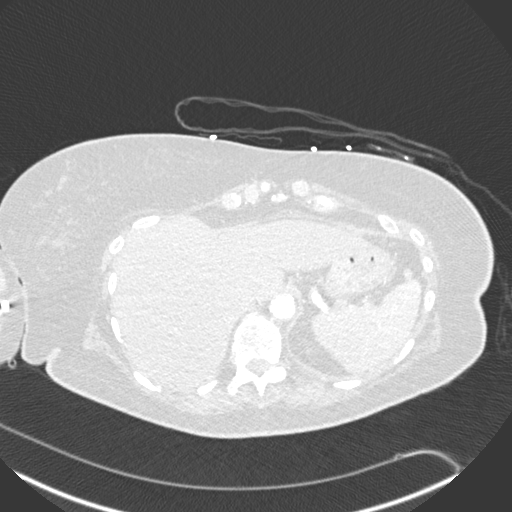
[im 81/308  soft-tissue]
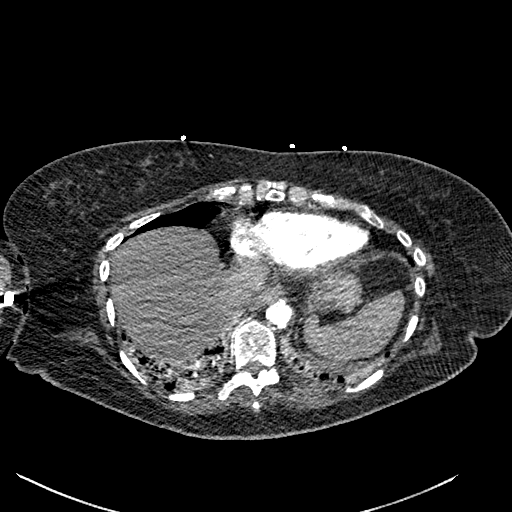
[im 107/308  lung]
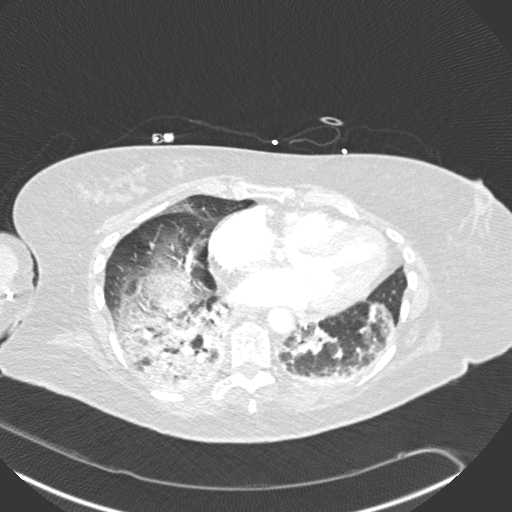
[im 121/308  soft-tissue]
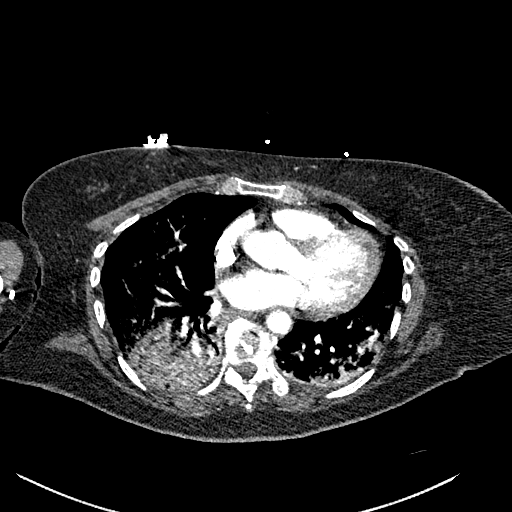
[im 147/308  lung]
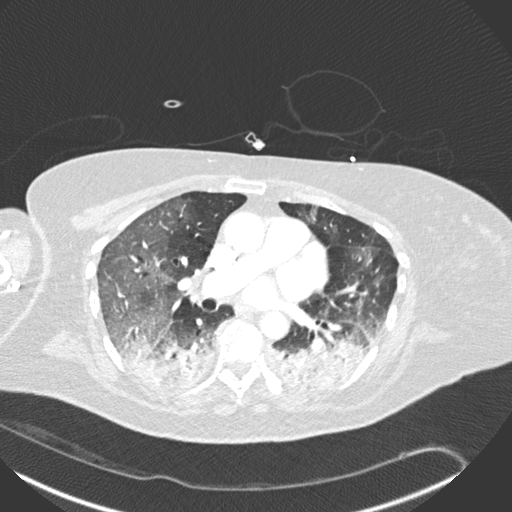
[im 161/308  soft-tissue]
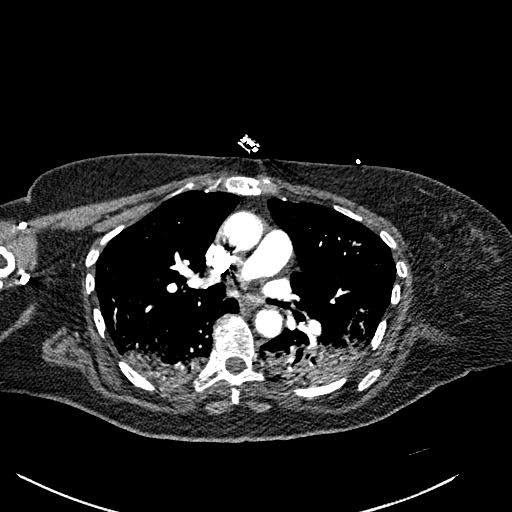
[im 187/308  lung]
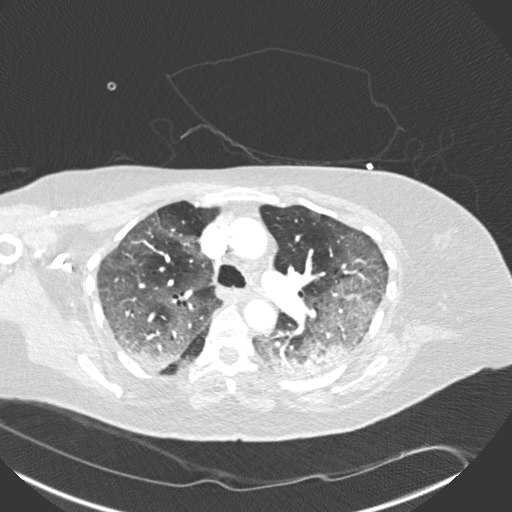
[im 201/308  soft-tissue]
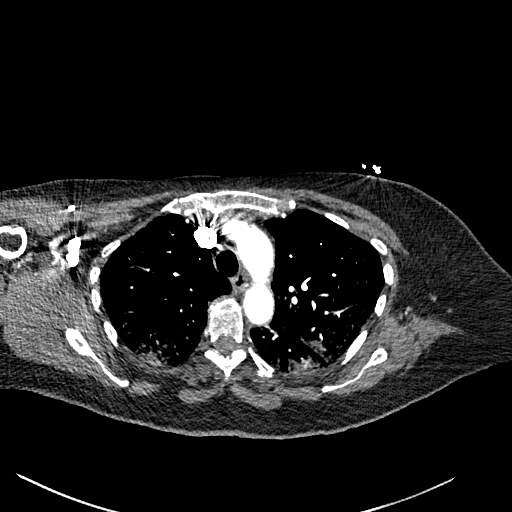
[im 227/308  lung]
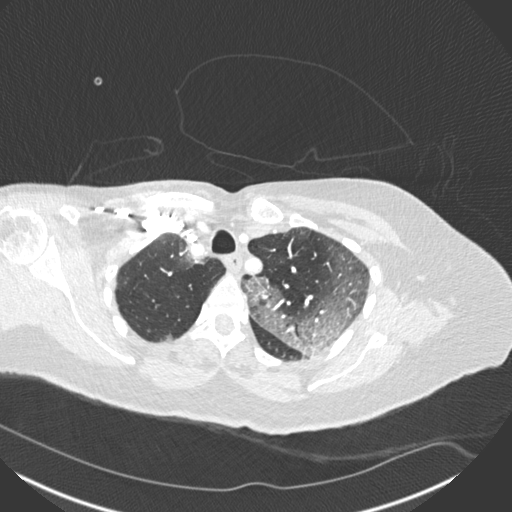
[im 254/308  soft-tissue]
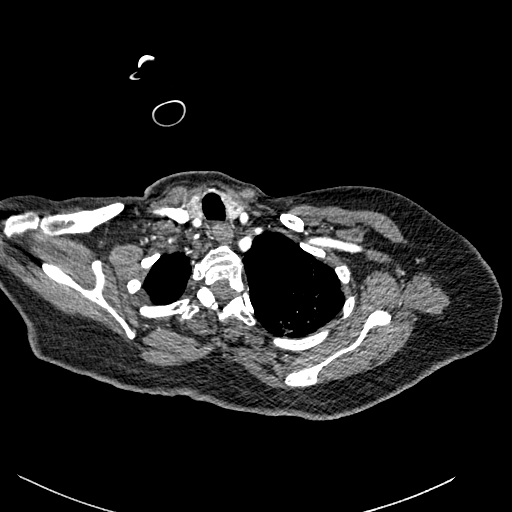
[im 267/308  lung]
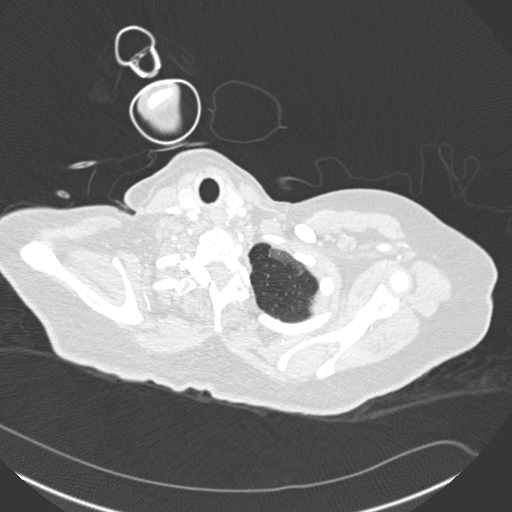
[im 294/308  soft-tissue]
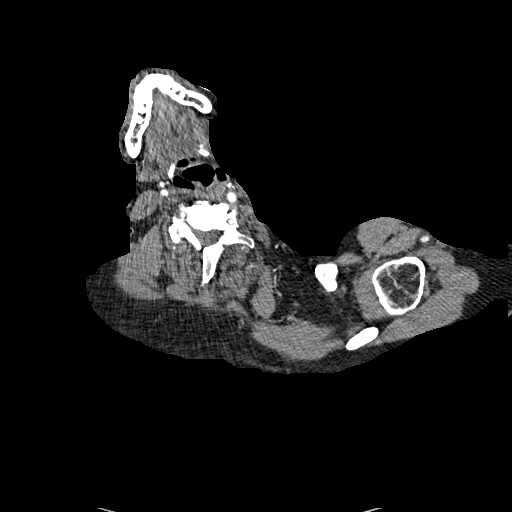

[Series 8: coronal mpr · coronal · 0.61mm/px · 3 of 151 slices shown]
[im 38/151  soft-tissue]
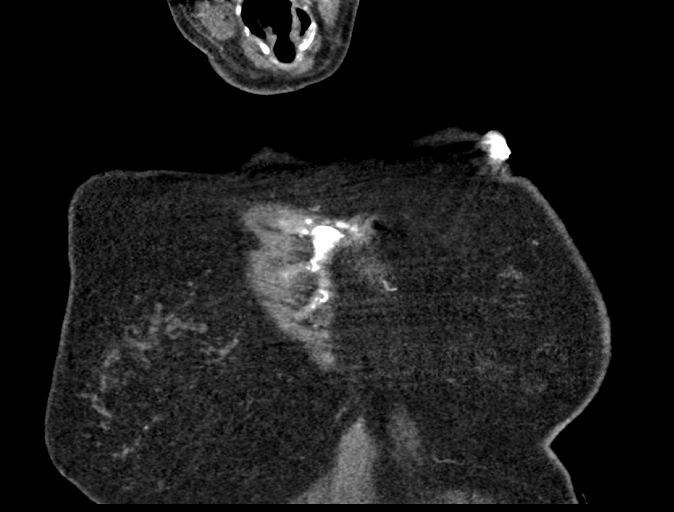
[im 76/151  soft-tissue]
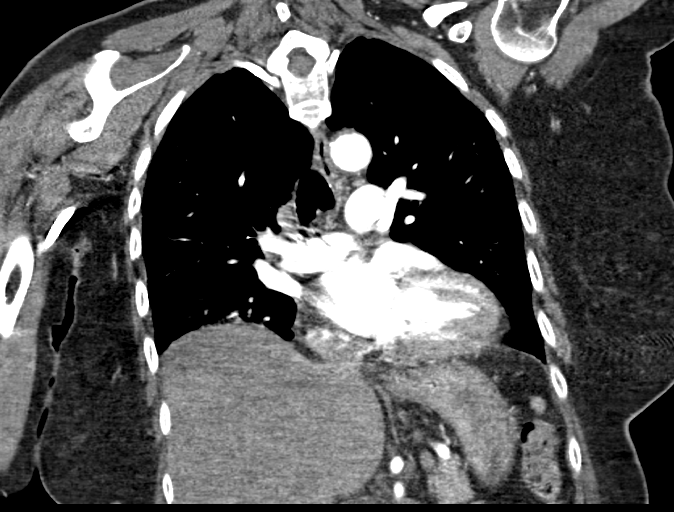
[im 113/151  soft-tissue]
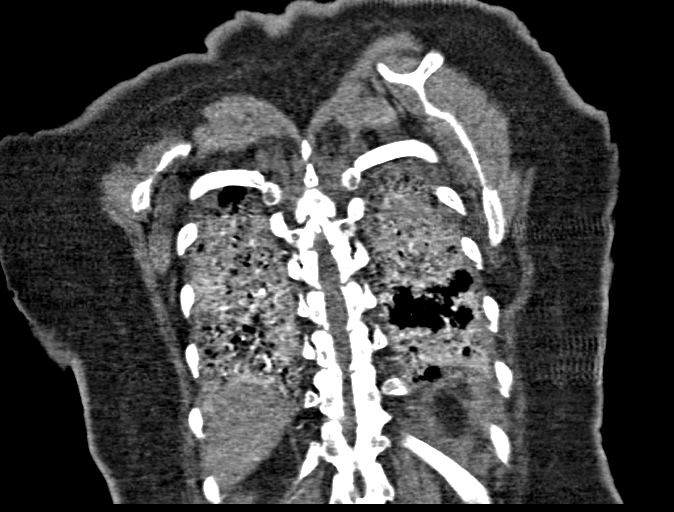

[17 of 46 positions shown; findings below may reference images not displayed]

FINDINGS: Cardiovascular: Satisfactory opacification the pulmonary arteries to
the segmental level. No pulmonary artery filling defects are
identified. Central pulmonary arteries are normal caliber. Normal
heart size. No pericardial effusion. Scattered coronary artery
calcifications are present. Atherosclerotic plaque within the normal
caliber aorta. No acute luminal abnormality nor periaortic stranding
hemorrhage. Shared origin of the brachiocephalic and left common
carotid artery. Minimal plaque in the otherwise normal proximal
great vessels.

Mediastinum/Nodes: Few scattered low-attenuation subcentimeter
mediastinal and hilar nodes are present. No enlarged mediastinal,
hilar or axillary adenopathy. No acute abnormality of the trachea or
esophagus. Thyroid gland demonstrates few subcentimeter
hypoattenuating foci, without significant enlargement of the gland.
No further imaging follow-up is warranted in a patient of this age
(Reference: [HOSPITAL]. [DATE]): 143-50).

Lungs/Pleura: There are multifocal areas of mixed ground-glass and
consolidative opacity with a peripheral predominance with more dense
opacification in the dependent portions of the lungs likely
reflecting some superimposed atelectatic changes. There is a trace
left effusion as well. No pneumothorax or right effusion. Small air
cyst seen in the right lung base (5/67).

Upper Abdomen: No acute abnormalities present in the visualized
portions of the upper abdomen. Small accessory splenule

Musculoskeletal: Multilevel discogenic and facet degenerative
changes, maximal C5-6 where sclerotic, likely Modic type endplate
changes are present with some mild retrolisthesis. Exaggerated
thoracic kyphosis is noted with a slight pectus deformity of the
chest (Malbran index 2.55). No acute or suspicious osseous lesions.

Review of the MIP images confirms the above findings.
IMPRESSION: 1. No evidence of acute pulmonary embolism.
2. Multifocal areas of mixed ground-glass and consolidative opacity
with a peripheral predominance with more dense opacification in the
dependent portions of the lungs likely reflecting some superimposed
atelectatic changes. Findings are favored to represent multifocal
pneumonia in the setting of [id number] positivity.
3. Trace left pleural effusion.
4. Aortic Atherosclerosis ([id number]).
5. Coronary artery calcifications are present. Please note that the
presence of coronary artery calcium documents the presence of
coronary artery disease, the severity of this disease and any
potential stenosis cannot be assessed on this non-gated CT
examination.

## 2021-09-18 IMAGING — DX DG CHEST 1V PORT
1 series · 1 of 1 positions shown · non-contrast
Comparison: 04/16/2020

CLINICAL DATA: Shortness of breath, COVID positive

EXAM:
PORTABLE CHEST 1 VIEW

[chest]
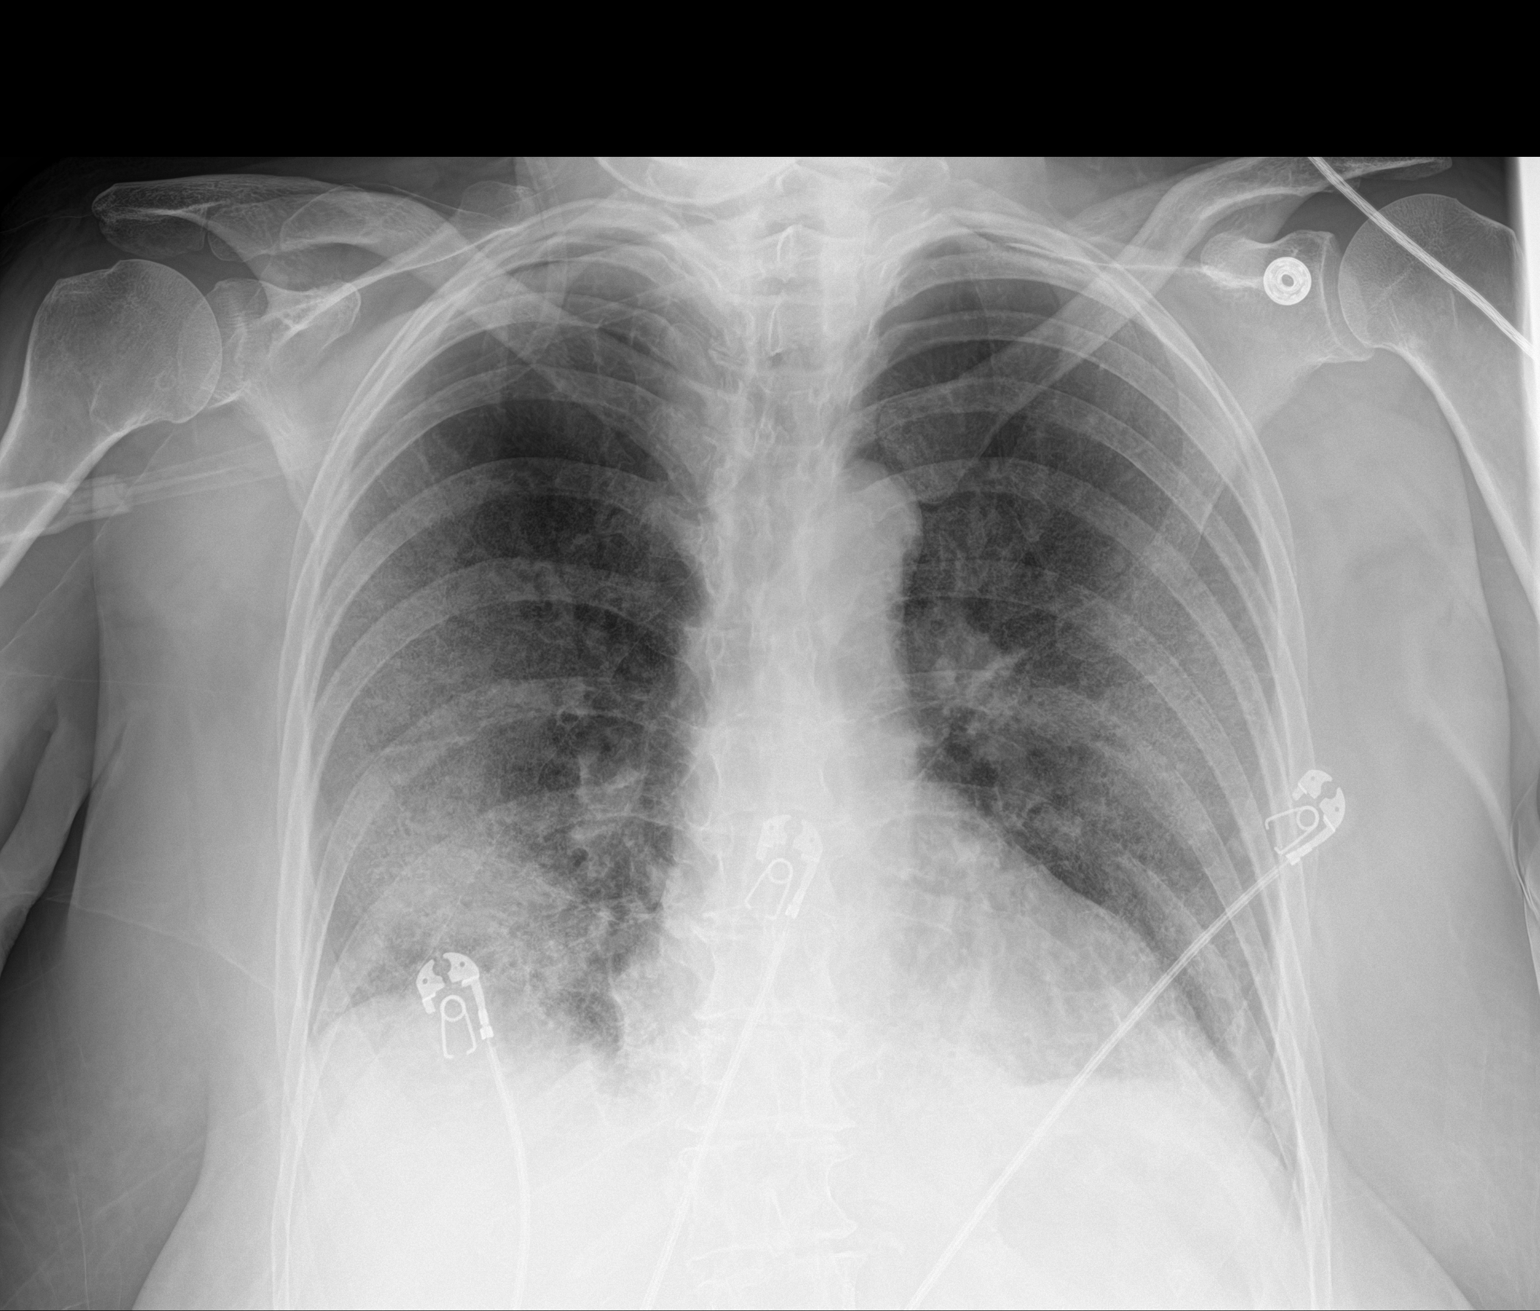

[1 of 1 positions shown; findings below may reference images not displayed]

FINDINGS: No significant change in AP portable chest radiograph with
heterogeneous airspace opacities present in the peripheral mid lungs
and lung bases. No new airspace opacity. The heart and mediastinum
are unremarkable.
IMPRESSION: No significant change in AP portable chest radiograph with
heterogeneous airspace opacities present in the peripheral mid lungs
and lung bases, in keeping with COVID airspace disease. No new
airspace opacity.
